# Patient Record
Sex: Male | Born: 1950 | Race: Black or African American | Hispanic: No | Marital: Married | Smoking: Former smoker
Health system: Southern US, Community
[De-identification: ages and names within clinical notes are randomized; demographics above are authoritative.]

## PROBLEM LIST (undated history)

## (undated) DIAGNOSIS — I10 Essential (primary) hypertension: Secondary | ICD-10-CM

## (undated) DIAGNOSIS — R931 Abnormal findings on diagnostic imaging of heart and coronary circulation: Secondary | ICD-10-CM

## (undated) DIAGNOSIS — R001 Bradycardia, unspecified: Secondary | ICD-10-CM

## (undated) DIAGNOSIS — G4733 Obstructive sleep apnea (adult) (pediatric): Secondary | ICD-10-CM

## (undated) DIAGNOSIS — Q2112 Patent foramen ovale: Secondary | ICD-10-CM

## (undated) DIAGNOSIS — I700 Atherosclerosis of aorta: Secondary | ICD-10-CM

## (undated) DIAGNOSIS — Z87898 Personal history of other specified conditions: Secondary | ICD-10-CM

## (undated) DIAGNOSIS — I4510 Unspecified right bundle-branch block: Secondary | ICD-10-CM

## (undated) DIAGNOSIS — R351 Nocturia: Secondary | ICD-10-CM

## (undated) DIAGNOSIS — I4819 Other persistent atrial fibrillation: Secondary | ICD-10-CM

## (undated) DIAGNOSIS — Z973 Presence of spectacles and contact lenses: Secondary | ICD-10-CM

## (undated) DIAGNOSIS — M1990 Unspecified osteoarthritis, unspecified site: Secondary | ICD-10-CM

## (undated) DIAGNOSIS — I77810 Thoracic aortic ectasia: Secondary | ICD-10-CM

## (undated) DIAGNOSIS — I441 Atrioventricular block, second degree: Secondary | ICD-10-CM

## (undated) DIAGNOSIS — C61 Malignant neoplasm of prostate: Secondary | ICD-10-CM

## (undated) HISTORY — PX: KNEE ARTHROSCOPY: SUR90

## (undated) HISTORY — DX: Atrioventricular block, second degree: I44.1

## (undated) HISTORY — PX: CARDIAC CATHETERIZATION: SHX172

## (undated) HISTORY — DX: Atherosclerosis of aorta: I70.0

## (undated) HISTORY — DX: Morbid (severe) obesity due to excess calories: E66.01

## (undated) HISTORY — DX: Unspecified right bundle-branch block: I45.10

## (undated) HISTORY — DX: Other persistent atrial fibrillation: I48.19

## (undated) HISTORY — DX: Patent foramen ovale: Q21.12

## (undated) HISTORY — DX: Bradycardia, unspecified: R00.1

## (undated) HISTORY — PX: COLONOSCOPY: SHX174

## (undated) HISTORY — DX: Thoracic aortic ectasia: I77.810

## (undated) HISTORY — PX: TRANSTHORACIC ECHOCARDIOGRAM: SHX275

## (undated) HISTORY — PX: PROSTATE BIOPSY: SHX241

## (undated) HISTORY — DX: Abnormal findings on diagnostic imaging of heart and coronary circulation: R93.1

---

## 2000-05-20 ENCOUNTER — Ambulatory Visit (HOSPITAL_COMMUNITY): Admission: RE | Admit: 2000-05-20 | Discharge: 2000-05-20 | Payer: Self-pay | Admitting: *Deleted

## 2000-07-08 ENCOUNTER — Ambulatory Visit (HOSPITAL_COMMUNITY): Admission: RE | Admit: 2000-07-08 | Discharge: 2000-07-08 | Payer: Self-pay | Admitting: Cardiology

## 2001-01-24 ENCOUNTER — Encounter: Payer: Self-pay | Admitting: Internal Medicine

## 2001-01-24 ENCOUNTER — Observation Stay (HOSPITAL_COMMUNITY): Admission: EM | Admit: 2001-01-24 | Discharge: 2001-01-25 | Payer: Self-pay | Admitting: Emergency Medicine

## 2001-11-05 ENCOUNTER — Encounter: Payer: Self-pay | Admitting: Emergency Medicine

## 2001-11-05 ENCOUNTER — Observation Stay (HOSPITAL_COMMUNITY): Admission: EM | Admit: 2001-11-05 | Discharge: 2001-11-06 | Payer: Self-pay | Admitting: Emergency Medicine

## 2002-09-14 ENCOUNTER — Encounter: Payer: Self-pay | Admitting: Family Medicine

## 2002-09-14 ENCOUNTER — Encounter: Admission: RE | Admit: 2002-09-14 | Discharge: 2002-09-14 | Payer: Self-pay | Admitting: Family Medicine

## 2003-08-30 ENCOUNTER — Inpatient Hospital Stay (HOSPITAL_COMMUNITY): Admission: EM | Admit: 2003-08-30 | Discharge: 2003-09-01 | Payer: Self-pay | Admitting: Emergency Medicine

## 2004-05-20 ENCOUNTER — Ambulatory Visit: Payer: Self-pay | Admitting: Gastroenterology

## 2004-12-25 ENCOUNTER — Emergency Department (HOSPITAL_COMMUNITY): Admission: EM | Admit: 2004-12-25 | Discharge: 2004-12-25 | Payer: Self-pay | Admitting: Emergency Medicine

## 2004-12-29 ENCOUNTER — Encounter: Admission: RE | Admit: 2004-12-29 | Discharge: 2004-12-29 | Payer: Self-pay | Admitting: Emergency Medicine

## 2005-07-05 ENCOUNTER — Encounter: Admission: RE | Admit: 2005-07-05 | Discharge: 2005-07-05 | Payer: Self-pay | Admitting: Neurosurgery

## 2012-06-16 HISTORY — PX: CARDIOVASCULAR STRESS TEST: SHX262

## 2012-11-04 ENCOUNTER — Emergency Department (HOSPITAL_COMMUNITY): Payer: 59

## 2012-11-04 ENCOUNTER — Inpatient Hospital Stay (HOSPITAL_COMMUNITY)
Admission: EM | Admit: 2012-11-04 | Discharge: 2012-11-05 | DRG: 308 | Disposition: A | Discharge status: Home / Self Care | Payer: 59 | Attending: Cardiology | Admitting: Cardiology

## 2012-11-04 ENCOUNTER — Encounter (HOSPITAL_COMMUNITY): Payer: Self-pay | Admitting: *Deleted

## 2012-11-04 ENCOUNTER — Other Ambulatory Visit: Payer: Self-pay | Admitting: Cardiology

## 2012-11-04 DIAGNOSIS — Z7901 Long term (current) use of anticoagulants: Secondary | ICD-10-CM

## 2012-11-04 DIAGNOSIS — G4733 Obstructive sleep apnea (adult) (pediatric): Secondary | ICD-10-CM | POA: Diagnosis present

## 2012-11-04 DIAGNOSIS — Z6841 Body Mass Index (BMI) 40.0 and over, adult: Secondary | ICD-10-CM

## 2012-11-04 DIAGNOSIS — I517 Cardiomegaly: Secondary | ICD-10-CM | POA: Diagnosis present

## 2012-11-04 DIAGNOSIS — Z87891 Personal history of nicotine dependence: Secondary | ICD-10-CM

## 2012-11-04 DIAGNOSIS — R0609 Other forms of dyspnea: Secondary | ICD-10-CM | POA: Diagnosis present

## 2012-11-04 DIAGNOSIS — I441 Atrioventricular block, second degree: Secondary | ICD-10-CM | POA: Diagnosis present

## 2012-11-04 DIAGNOSIS — I5031 Acute diastolic (congestive) heart failure: Secondary | ICD-10-CM | POA: Diagnosis present

## 2012-11-04 DIAGNOSIS — I10 Essential (primary) hypertension: Secondary | ICD-10-CM | POA: Diagnosis present

## 2012-11-04 DIAGNOSIS — I4891 Unspecified atrial fibrillation: Principal | ICD-10-CM | POA: Diagnosis present

## 2012-11-04 DIAGNOSIS — M129 Arthropathy, unspecified: Secondary | ICD-10-CM | POA: Diagnosis present

## 2012-11-04 DIAGNOSIS — R001 Bradycardia, unspecified: Secondary | ICD-10-CM | POA: Diagnosis present

## 2012-11-04 DIAGNOSIS — R0989 Other specified symptoms and signs involving the circulatory and respiratory systems: Secondary | ICD-10-CM | POA: Diagnosis present

## 2012-11-04 DIAGNOSIS — R079 Chest pain, unspecified: Secondary | ICD-10-CM

## 2012-11-04 DIAGNOSIS — I498 Other specified cardiac arrhythmias: Secondary | ICD-10-CM | POA: Diagnosis present

## 2012-11-04 HISTORY — DX: Essential (primary) hypertension: I10

## 2012-11-04 HISTORY — DX: Unspecified osteoarthritis, unspecified site: M19.90

## 2012-11-04 LAB — CBC
MCH: 29.6 pg (ref 26.0–34.0)
MCHC: 35.3 g/dL (ref 30.0–36.0)
Platelets: 244 10*3/uL (ref 150–400)
RBC: 4.6 MIL/uL (ref 4.22–5.81)
RDW: 12.8 % (ref 11.5–15.5)
WBC: 6.5 10*3/uL (ref 4.0–10.5)

## 2012-11-04 LAB — BASIC METABOLIC PANEL
BUN: 17 mg/dL (ref 6–23)
CO2: 22 mEq/L (ref 19–32)
GFR calc non Af Amer: 87 mL/min — ABNORMAL LOW (ref >90)
Glucose, Bld: 136 mg/dL — ABNORMAL HIGH (ref 70–99)
Sodium: 138 mEq/L (ref 135–145)

## 2012-11-04 LAB — COMPREHENSIVE METABOLIC PANEL
AST: 16 U/L (ref 0–37)
Albumin: 3.2 g/dL — ABNORMAL LOW (ref 3.5–5.2)
Alkaline Phosphatase: 63 U/L (ref 39–117)
Chloride: 103 mEq/L (ref 96–112)
Potassium: 3.7 mEq/L (ref 3.5–5.1)
Sodium: 138 mEq/L (ref 135–145)
Total Bilirubin: 0.6 mg/dL (ref 0.3–1.2)
Total Protein: 7.2 g/dL (ref 6.0–8.3)

## 2012-11-04 LAB — PRO B NATRIURETIC PEPTIDE: Pro B Natriuretic peptide (BNP): 758.8 pg/mL — ABNORMAL HIGH (ref 0–125)

## 2012-11-04 LAB — TROPONIN I
Troponin I: <0.3 ng/mL (ref <0.30)
Troponin I: <0.3 ng/mL (ref <0.30)

## 2012-11-04 LAB — POCT I-STAT TROPONIN I

## 2012-11-04 LAB — HEMOGLOBIN A1C: Mean Plasma Glucose: 134 mg/dL — ABNORMAL HIGH (ref <117)

## 2012-11-04 LAB — TSH: TSH: 1.023 u[IU]/mL (ref 0.350–4.500)

## 2012-11-04 MED ORDER — ASPIRIN 81 MG PO CHEW
324.0000 mg | CHEWABLE_TABLET | Freq: Once | ORAL | Status: AC
  Administered 2012-11-04: 324 mg via ORAL
  Filled 2012-11-04: qty 4

## 2012-11-04 MED ORDER — ASPIRIN EC 81 MG PO TBEC
81.0000 mg | DELAYED_RELEASE_TABLET | Freq: Every day | ORAL | Status: DC
  Administered 2012-11-05: 81 mg via ORAL
  Filled 2012-11-04: qty 1

## 2012-11-04 MED ORDER — SODIUM CHLORIDE 0.9 % IJ SOLN: 3.0000 mL | INTRAMUSCULAR | Status: DC | PRN

## 2012-11-04 MED ORDER — SODIUM CHLORIDE 0.9 % IV SOLN: 250.0000 mL | INTRAVENOUS | Status: DC | PRN

## 2012-11-04 MED ORDER — POTASSIUM CHLORIDE CRYS ER 20 MEQ PO TBCR
40.0000 meq | EXTENDED_RELEASE_TABLET | Freq: Once | ORAL | Status: AC
  Administered 2012-11-04: 40 meq via ORAL
  Filled 2012-11-04: qty 2

## 2012-11-04 MED ORDER — SODIUM CHLORIDE 0.9 % IJ SOLN
3.0000 mL | Freq: Two times a day (BID) | INTRAMUSCULAR | Status: DC
  Administered 2012-11-04 – 2012-11-05 (×2): 3 mL via INTRAVENOUS

## 2012-11-04 MED ORDER — NEBIVOLOL HCL 5 MG PO TABS
5.0000 mg | ORAL_TABLET | Freq: Every day | ORAL | Status: DC
  Administered 2012-11-05: 5 mg via ORAL
  Filled 2012-11-04 (×2): qty 1

## 2012-11-04 MED ORDER — ONDANSETRON HCL 4 MG/2ML IJ SOLN: 4.0000 mg | Freq: Four times a day (QID) | INTRAMUSCULAR | Status: DC | PRN

## 2012-11-04 MED ORDER — ACETAMINOPHEN 325 MG PO TABS: 650.0000 mg | ORAL_TABLET | ORAL | Status: DC | PRN

## 2012-11-04 MED ORDER — DILTIAZEM HCL ER 180 MG PO CP24
180.0000 mg | ORAL_CAPSULE | Freq: Every day | ORAL | Status: DC
  Administered 2012-11-05: 180 mg via ORAL
  Filled 2012-11-04 (×2): qty 1

## 2012-11-04 MED ORDER — NITROGLYCERIN 0.4 MG SL SUBL: 0.4000 mg | SUBLINGUAL_TABLET | SUBLINGUAL | Status: DC | PRN

## 2012-11-04 MED ORDER — ASPIRIN 81 MG PO CHEW: 324.0000 mg | CHEWABLE_TABLET | ORAL | Status: AC

## 2012-11-04 MED ORDER — DIGOXIN 125 MCG PO TABS
0.1250 mg | ORAL_TABLET | Freq: Every day | ORAL | Status: DC
  Filled 2012-11-04: qty 1

## 2012-11-04 MED ORDER — SODIUM CHLORIDE 0.9 % IJ SOLN: 3.0000 mL | Freq: Two times a day (BID) | INTRAMUSCULAR | Status: DC

## 2012-11-04 MED ORDER — HYDROCORTISONE 1 % EX CREA
1.0000 "application " | TOPICAL_CREAM | Freq: Three times a day (TID) | CUTANEOUS | Status: DC | PRN
  Filled 2012-11-04: qty 28

## 2012-11-04 MED ORDER — FUROSEMIDE 10 MG/ML IJ SOLN
20.0000 mg | Freq: Once | INTRAMUSCULAR | Status: AC
  Administered 2012-11-04: 20 mg via INTRAVENOUS

## 2012-11-04 MED ORDER — LOSARTAN POTASSIUM 50 MG PO TABS
100.0000 mg | ORAL_TABLET | Freq: Every day | ORAL | Status: DC
  Administered 2012-11-05: 100 mg via ORAL
  Filled 2012-11-04: qty 2

## 2012-11-04 MED ORDER — WARFARIN - PHARMACIST DOSING INPATIENT
Freq: Every day | Status: DC
  Administered 2012-11-04: 18:00:00

## 2012-11-04 MED ORDER — SODIUM CHLORIDE 0.9 % IV SOLN: 250.0000 mL | INTRAVENOUS | Status: DC

## 2012-11-04 MED ORDER — WARFARIN SODIUM 7.5 MG PO TABS
7.5000 mg | ORAL_TABLET | Freq: Once | ORAL | Status: AC
  Administered 2012-11-04: 7.5 mg via ORAL
  Filled 2012-11-04: qty 1

## 2012-11-04 MED ORDER — ASPIRIN 300 MG RE SUPP
300.0000 mg | RECTAL | Status: AC
  Filled 2012-11-04: qty 1

## 2012-11-04 NOTE — Progress Notes (Signed)
ANTICOAGULATION CONSULT NOTE - Initial Consult  Pharmacy Consult for Heparin Indication: CP / Afib  No Known Allergies  Vital Signs: Temp: 98.8 F (37.1 C) (05/08 0552) Temp src: Oral (05/08 0552) BP: 148/68 mmHg (05/08 1100) Pulse Rate: 71 (05/08 1100)  Labs:  Recent Labs  11/04/12 0610 11/04/12 0617  HGB 13.6  --   HCT 38.5*  --   PLT 244  --   LABPROT  --  26.2*  INR  --  2.55*  CREATININE 0.95  --     CrCl is unknown because there is no height on file for the current visit.   Medical History: Past Medical History  Diagnosis Date  . Atrial fibrillation   . Hypertension    Assessment: 62 year old male on Coumadin PTA for Afib.  INR is currently therapeutic at 2.55.   Admitted with CP.  Planning to transition to heparin when INR is less than 2.  Goal of Therapy:  Heparin level 0.3-0.7 units/ml Monitor platelets by anticoagulation protocol: Yes   Plan:  1) Hold heparin until INR < 2 2) Daily INR  Thank you. Okey Regal, PharmD [phone]  11/04/2012,11:51 AM

## 2012-11-04 NOTE — ED Notes (Signed)
Pt c/o CP since yesterday.  States it worsened overnight.  C/o chest tightness, SOB, weakness.  Denies n/v.

## 2012-11-04 NOTE — Care Management Note (Signed)
  Page 1 of 1   11/04/2012     2:22:35 PM   CARE MANAGEMENT NOTE 11/04/2012  Patient:  Alec Hunt, Alec Hunt   Account Number:  [id number]  Date Initiated:  11/04/2012  Documentation initiated by:  Junius Creamer  Subjective/Objective Assessment:   slow at fib     Action/Plan:   lives w wife   Anticipated DC Date:     Anticipated DC Plan:        DC Planning Services  CM consult      Choice offered to / List presented to:             Status of service:   Medicare Important Message given?   (If response is "NO", the following Medicare IM given date fields will be blank) Date Medicare IM given:   Date Additional Medicare IM given:    Discharge Disposition:    Per UR Regulation:  Reviewed for med. necessity/level of care/duration of stay  If discussed at Long Length of Stay Meetings, dates discussed:    Comments:

## 2012-11-04 NOTE — H&P (Addendum)
Alec Hunt is a 62 y.o. male  Admit date: 11/04/2012 Referring Physician: [facility] Primary Cardiologist:: Armanda Magic Chief complaint / reason for admission: Chest discomfort and atrial fibrillation  HPI: 62 year old gentleman with obstructive sleep apnea, hypertension, and history of paroxysmal atrial fibrillation. Yesterday it around 2:00, while at work he developed chest discomfort associated with palpitations. He noted an irregular heart rate. He finished work. He had a restless night because of chest discomfort and dyspnea, he came to the emergency room at around 5 AM where he was noted to be in atrial fibrillation with a controlled ventricular rate. His begin with him this morning he is comfortable lying flat. He has not have any dyspnea. He has persisting mild chest    PMH:    Past Medical History  Diagnosis Date  . Atrial fibrillation   . Hypertension   . Sleep apnea     cpap  . Arthritis     kness & shoulders    PSH:    Past Surgical History  Procedure Laterality Date  . Knee arthroscopic    . Colonoscopy      ALLERGIES:   Review of patient's allergies indicates no known allergies.  Prior to Admit Meds:   Prescriptions prior to admission  Medication Sig Dispense Refill  . digoxin (LANOXIN) 0.125 MG tablet Take 0.125 mg by mouth daily.      Marland Kitchen diltiazem (DILACOR XR) 180 MG 24 hr capsule Take 180 mg by mouth daily.      Marland Kitchen losartan (COZAAR) 100 MG tablet Take 100 mg by mouth daily.      . nebivolol (BYSTOLIC) 5 MG tablet Take 5 mg by mouth daily.      Marland Kitchen warfarin (COUMADIN) 5 MG tablet Take 7.5-10 mg by mouth every evening. tue thu sat 1.5 tabs , all other days--2 tabs       Family HX:   History reviewed. No pertinent family history. Social HX:    History   Social History  . Marital Status: Married    Spouse Name: N/A    Number of Children: N/A  . Years of Education: N/A   Occupational History  . Not on file.   Social History Main Topics  .  Smoking status: Former Smoker    Quit date: 02/02/2001  . Smokeless tobacco: Never Used  . Alcohol Use: No  . Drug Use: No  . Sexually Active: Not on file   Other Topics Concern  . Not on file   Social History Narrative  . No narrative on file     ROS: No bleeding on Coumadin. No history of stroke. Denies orthopnea and PND. Sleeps with CPAP mask. Prior to the irregular heart rhythm, he has not had chest pain. No episodes of syncope.  Physical Exam: Blood pressure 167/100, pulse 66, temperature 97.8 F (36.6 C), temperature source Oral, resp. rate 13, height 6\' 1"  (1.854 m), weight 153.4 kg (338 lb 3 oz), SpO2 100.00%.    The patient is in no acute distress. He is a moderate to morbidly obese African American male.  Skin is dry. No nail bed cyanosis.  Neck exam is difficult because of his size. No obvious JVD is noted. No carotid bruits are heard.  Chest is clear to auscultation and percussion. No wheezing or rales are heard.  The cardiac exam reveals no murmur, rub, or gallop. The rhythm is slow and irregularly irregular.   Abdomen is soft. No tenderness is noted.  Bowel sounds are normal.  Extremities reveal no edema. Pulses are 2+ and symmetric in upper and lower extremities.  The neurological exam is unremarkable. Labs:   Lab Results  Component Value Date   WBC 6.5 11/04/2012   HGB 13.6 11/04/2012   HCT 38.5* 11/04/2012   MCV 83.7 11/04/2012   PLT 244 11/04/2012     Recent Labs Lab 11/04/12 1418  NA 138  K 3.7  CL 103  CO2 27  BUN 14  CREATININE 1.00  CALCIUM 9.0  PROT 7.2  BILITOT 0.6  ALKPHOS 63  ALT 14  AST PENDING  GLUCOSE 120*   Troponin (Point of Care Test)  Recent Labs  11/04/12 0934  TROPIPOC 0.01     Radiology:   RADIOLOGY REPORT*  Clinical Data: 62 year old male chest pain. Chest tightness.  Atrial fibrillation.  CHEST - 2 VIEW  Comparison: 12/25/2004.  Findings: Stable lung volumes. Although, on the lateral view the  lungs are more  shallow. Cardiac size appears mildly increased and  2006 and is at the upper limits of normal to mildly enlarged. Other  mediastinal contours are within normal limits. Visualized tracheal  air column is within normal limits. No pneumothorax. No pleural  effusion. On the lateral view lung base atelectasis is suspected,  with streaky opacity that is not correlated on the frontal. No  acute osseous abnormality identified.  IMPRESSION:  Mild cardiac enlargement since 2006. Atelectasis suspected on the  lateral view.   ECHOCARDIOGRAM: 05/2012 at [facility] Conclusions: 1. Mild concentric left ventricular hypertrophy. 2. Left ventricular ejection fraction estimated by 2D at 66.9 % percent 3. Mild left atrial enlargement. 4.1 cm  EKG:   Atrial fib with controlled to slow heart rate. LVH. No acute ST-T wave change  ASSESSMENT:  1. Recurrent atrial fibrillation with slow ventricular response  2. Chest tightness without EKG changes of ischemia  3. Obstructive sleep apnea  4. Obesity  5. Hypertension  6. Acute diastolic heart failure in a patient with known LVH, normal systolic function, and elevated BNP  Plan:  1. Serial cardiac markers to rule out myocardial infarction  2. Hold Coumadin and start IV heparin  3. N.p.o. after midnight for electrical cardioversion if he remains in atrial fibrillation  4. May have to  adjust his current medical regimen to avoid excessive bradycardia.  5. IV Lasix with potassium replacement Lesleigh Noe 11/04/2012 4:32 PM

## 2012-11-04 NOTE — ED Provider Notes (Signed)
EKG Interpretation:  Date & Time: 11/04/2012 5:48 AM  Rate: 61  Rhythm: atrial fibrillation  QRS Axis: normal  Intervals: normal  ST/T Wave abnormalities: normal  Conduction Disutrbances:none  Narrative Interpretation:   Old EKG Reviewed: none available      Hanley Seamen, MD 11/04/12 [phone]

## 2012-11-04 NOTE — ED Provider Notes (Signed)
Medical screening examination/treatment/procedure(s) were conducted as a shared visit with non-physician practitioner(s) and myself.  I personally evaluated the patient during the encounter  Patient just having some mild lower substernal pressure at this time. Monitor is showing atrial fibrillation with a slow ventricular response. Awaiting lab results.  Hanley Seamen, MD 11/04/12 [phone]

## 2012-11-04 NOTE — Progress Notes (Signed)
ANTICOAGULATION CONSULT NOTE - Initial Consult  Pharmacy Consult for Coumadin Indication: atrial fibrillation  No Known Allergies  Patient Measurements: Height: 6\' 1"  (185.4 cm) Weight: 338 lb 3 oz (153.4 kg) IBW/kg (Calculated) : 79.9  Vital Signs: Temp: 97.8 F (36.6 C) (05/08 1200) Temp src: Oral (05/08 1200) BP: 167/100 mmHg (05/08 1215) Pulse Rate: 66 (05/08 1215)  Labs:  Recent Labs  11/04/12 0610 11/04/12 0617 11/04/12 1418  HGB 13.6  --   --   HCT 38.5*  --   --   PLT 244  --   --   LABPROT  --  26.2*  --   INR  --  2.55*  --   CREATININE 0.95  --  1.00  TROPONINI  --   --  <0.30    Estimated Creatinine Clearance: 118.4 ml/min (by C-G formula based on Cr of 1).   Medical History: Past Medical History  Diagnosis Date  . Atrial fibrillation   . Hypertension   . Sleep apnea     cpap  . Arthritis     kness & shoulders    Medications:  Prescriptions prior to admission  Medication Sig Dispense Refill  . digoxin (LANOXIN) 0.125 MG tablet Take 0.125 mg by mouth daily.      Marland Kitchen diltiazem (DILACOR XR) 180 MG 24 hr capsule Take 180 mg by mouth daily.      Marland Kitchen losartan (COZAAR) 100 MG tablet Take 100 mg by mouth daily.      . nebivolol (BYSTOLIC) 5 MG tablet Take 5 mg by mouth daily.      Marland Kitchen warfarin (COUMADIN) 5 MG tablet Take 7.5-10 mg by mouth every evening. tue thu sat 1.5 tabs , all other days--2 tabs        Assessment: 62 year old male on chronic anticoagulation with Coumadin for atrial fibrillation.  His INR is therapeutic and he is to continue Coumadin pending possible DCCV 5/9.  He has not taken his Coumadin dose today.  Goal of Therapy:  INR 2-3   Plan:  Give his usual Thursday Coumadin dose of 7.5 mg Daily PT/INR  Estella Husk, Pharm.D., BCPS Clinical Pharmacist Phone: [phone] or [phone] Pager: [phone] 11/04/2012, 4:56 PM

## 2012-11-04 NOTE — ED Provider Notes (Signed)
History     CSN: [id number]  Arrival date & time 11/04/12  0547   First MD Initiated Contact with Patient 11/04/12 [phone]      Chief Complaint  Patient presents with  . Chest Pain    (Consider location/radiation/quality/duration/timing/severity/associated sxs/prior treatment) HPI Comments: Patient with history of atrial fibrillation on Coumadin, high blood pressure presents with complaint of chest tightness that began yesterday. Patient states that he was found to be in atrial fibrillation while at work. He complains of associated shortness of breath. Chest tightness is across the middle anterior chest. It does not radiate. It is not associated with diaphoresis, nausea or vomiting, syncope. Patient notes that he was recently changed from Benicar to losartan due to insurance issues. He is also on digoxin which he has been taking. His atrial fibrillation has been well controlled. He denies lower extremity edema, abdominal pain, bowel or urinary symptoms. No aspirin this morning. Onset of symptoms acute. Course is constant. Nothing makes symptoms better or worse. Patient reports catheterization about 10 years ago. No stents placed at that time. He is followed by [facility] cardiology.  The history is provided by the patient.    Past Medical History  Diagnosis Date  . Atrial fibrillation   . Hypertension     History reviewed. No pertinent past surgical history.  History reviewed. No pertinent family history.  History  Substance Use Topics  . Smoking status: Former Games developer  . Smokeless tobacco: Not on file  . Alcohol Use: No      Review of Systems  Constitutional: Negative for fever.  HENT: Negative for sore throat and rhinorrhea.   Eyes: Negative for redness.  Respiratory: Positive for shortness of breath. Negative for cough.   Cardiovascular: Positive for chest pain. Negative for leg swelling.  Gastrointestinal: Negative for nausea, vomiting, abdominal pain and diarrhea.   Genitourinary: Negative for dysuria.  Musculoskeletal: Negative for myalgias.  Skin: Negative for rash.  Neurological: Negative for headaches.    Allergies  Review of patient's allergies indicates no known allergies.  Home Medications   Current Outpatient Rx  Name  Route  Sig  Dispense  Refill  . digoxin (LANOXIN) 0.125 MG tablet   Oral   Take 0.125 mg by mouth daily.         Marland Kitchen diltiazem (DILACOR XR) 180 MG 24 hr capsule   Oral   Take 180 mg by mouth daily.         Marland Kitchen losartan (COZAAR) 100 MG tablet   Oral   Take 100 mg by mouth daily.         . nebivolol (BYSTOLIC) 5 MG tablet   Oral   Take 5 mg by mouth daily.         Marland Kitchen warfarin (COUMADIN) 5 MG tablet   Oral   Take 7.5-10 mg by mouth every evening. tue thu sat 1.5 tabs , all other days--2 tabs           BP 165/89  Pulse 60  Temp(Src) 98.8 F (37.1 C) (Oral)  Resp 18  SpO2 99%  Physical Exam  Nursing note and vitals reviewed. Constitutional: He appears well-developed and well-nourished.  HENT:  Head: Normocephalic and atraumatic.  Eyes: Conjunctivae are normal. Right eye exhibits no discharge. Left eye exhibits no discharge.  Neck: Normal range of motion. Neck supple.  Cardiovascular: Normal rate, regular rhythm and normal heart sounds.   Pulmonary/Chest: Effort normal and breath sounds normal.  Abdominal: Soft. There is no tenderness.  Musculoskeletal: He exhibits no edema.  Neurological: He is alert.  Skin: Skin is warm and dry.  Psychiatric: He has a normal mood and affect.    ED Course  Procedures (including critical care time)  Labs Reviewed  CBC - Abnormal; Notable for the following:    HCT 38.5 (*)    All other components within normal limits  BASIC METABOLIC PANEL - Abnormal; Notable for the following:    Glucose, Bld 136 (*)    GFR calc non Af Amer 87 (*)    All other components within normal limits  PROTIME-INR - Abnormal; Notable for the following:    Prothrombin Time 26.2  (*)    INR 2.55 (*)    All other components within normal limits  DIGOXIN LEVEL - Abnormal; Notable for the following:    Digoxin Level 0.7 (*)    All other components within normal limits  POCT I-STAT TROPONIN I  POCT I-STAT TROPONIN I   Dg Chest 2 View  11/04/2012  *RADIOLOGY REPORT*  Clinical Data: 62 year old male chest pain.  Chest tightness. Atrial fibrillation.  CHEST - 2 VIEW  Comparison: 12/25/2004.  Findings: Stable lung volumes.  Although, on the lateral view the lungs are more shallow.  Cardiac size appears mildly increased and 2006 and is at the upper limits of normal to mildly enlarged. Other mediastinal contours are within normal limits.  Visualized tracheal air column is within normal limits.  No pneumothorax.  No pleural effusion.  On the lateral view lung base atelectasis is suspected, with streaky opacity that is not correlated on the frontal. No acute osseous abnormality identified.  IMPRESSION: Mild cardiac enlargement since 2006.  Atelectasis suspected on the lateral view.   Original Report Authenticated By: Odessa Fleming III, M.D.      1. Atrial fibrillation with slow ventricular response   2. Chest pain     6:07 AM Patient seen and examined. Work-up initiated. Medications ordered.   Vital signs reviewed and are as follows: Filed Vitals:   11/04/12 0552  BP: 165/89  Pulse: 60  Temp: 98.8 F (37.1 C)  Resp: 18   8:14 AM Re-exam pt doing well. Digoxin slightly low. Awaiting BMP.   9:23 AM Spoke with Dr. Katrinka Blazing of [facility] cardiology who will see.   10:21 AM [facility] will admit.    MDM  CP/afib admit.         Renne Crigler, PA-C 11/04/12 1022

## 2012-11-04 NOTE — ED Notes (Signed)
Pt. C/o central chest pressure starting yesterday with SOB. Pt. In afib, states has a hx of same but converted out on own.

## 2012-11-05 ENCOUNTER — Encounter (HOSPITAL_COMMUNITY): Payer: Self-pay | Admitting: Anesthesiology

## 2012-11-05 ENCOUNTER — Encounter (HOSPITAL_COMMUNITY)
Admission: EM | Disposition: A | Discharge status: Home / Self Care | Payer: Self-pay | Source: Home / Self Care | Attending: Cardiology

## 2012-11-05 DIAGNOSIS — G4733 Obstructive sleep apnea (adult) (pediatric): Secondary | ICD-10-CM | POA: Diagnosis present

## 2012-11-05 DIAGNOSIS — R001 Bradycardia, unspecified: Secondary | ICD-10-CM | POA: Diagnosis present

## 2012-11-05 LAB — BASIC METABOLIC PANEL
CO2: 28 mEq/L (ref 19–32)
Calcium: 9 mg/dL (ref 8.4–10.5)
Creatinine, Ser: 1.05 mg/dL (ref 0.50–1.35)

## 2012-11-05 LAB — PROTIME-INR: Prothrombin Time: 24.7 seconds — ABNORMAL HIGH (ref 11.6–15.2)

## 2012-11-05 SURGERY — CARDIOVERSION
Anesthesia: Monitor Anesthesia Care | Wound class: Clean

## 2012-11-05 MED ORDER — WARFARIN SODIUM 10 MG PO TABS
10.0000 mg | ORAL_TABLET | Freq: Once | ORAL | Status: DC
  Filled 2012-11-05: qty 1

## 2012-11-05 NOTE — Progress Notes (Signed)
Pt remains in SB rate 50's with occasional dips into low 30's.  Occasional dropped QRS complexes noted.  Asymptomatic .  Dr Terressa Koyanagi made aware of same.  Kevin Fenton, RN

## 2012-11-05 NOTE — Preoperative (Signed)
Beta Blockers   Reason not to administer Beta Blockers:Not Applicable 

## 2012-11-05 NOTE — Anesthesia Preprocedure Evaluation (Signed)
Anesthesia Evaluation  Patient identified by MRN, date of birth, ID band Patient awake    Reviewed: Allergy & Precautions, H&P , NPO status , Patient's Chart, lab work & pertinent test results, reviewed documented beta blocker date and time   Airway Mallampati: II TM Distance: >3 FB Neck ROM: full    Dental   Pulmonary sleep apnea ,  breath sounds clear to auscultation        Cardiovascular hypertension, On Medications and On Home Beta Blockers + dysrhythmias Atrial Fibrillation Rhythm:regular     Neuro/Psych negative neurological ROS  negative psych ROS   GI/Hepatic negative GI ROS, Neg liver ROS,   Endo/Other  Morbid obesity  Renal/GU negative Renal ROS  negative genitourinary   Musculoskeletal   Abdominal   Peds  Hematology negative hematology ROS (+)   Anesthesia Other Findings See surgeon's H&P   Reproductive/Obstetrics negative OB ROS                           Anesthesia Physical Anesthesia Plan  ASA: III  Anesthesia Plan: General   Post-op Pain Management:    Induction: Intravenous  Airway Management Planned: Mask  Additional Equipment:   Intra-op Plan:   Post-operative Plan:   Informed Consent: I have reviewed the patients History and Physical, chart, labs and discussed the procedure including the risks, benefits and alternatives for the proposed anesthesia with the patient or authorized representative who has indicated his/her understanding and acceptance.   Dental Advisory Given  Plan Discussed with: CRNA and Surgeon  Anesthesia Plan Comments:         Anesthesia Quick Evaluation

## 2012-11-05 NOTE — Progress Notes (Signed)
ANTICOAGULATION CONSULT NOTE  Pharmacy Consult for Coumadin Indication: atrial fibrillation  No Known Allergies  Labs:  Recent Labs  11/04/12 0610 11/04/12 0617 11/04/12 1418 11/04/12 1725 11/04/12 2251 11/05/12 0450  HGB 13.6  --   --   --   --   --   HCT 38.5*  --   --   --   --   --   PLT 244  --   --   --   --   --   LABPROT  --  26.2*  --   --   --  24.7*  INR  --  2.55*  --   --   --  2.35*  CREATININE 0.95  --  1.00  --   --  1.05  TROPONINI  --   --  <0.30 <0.30 <0.30  --     Estimated Creatinine Clearance: 113 ml/min (by C-G formula based on Cr of 1.05).   Medical History: Past Medical History  Diagnosis Date  . Atrial fibrillation   . Hypertension   . Sleep apnea     cpap  . Arthritis     kness & shoulders   Assessment: 62 year old male on chronic anticoagulation with Coumadin for atrial fibrillation.  His INR is therapeutic and he is to continue Coumadin pending possible DCCV 5/9.   Goal of Therapy:  INR 2-3   Plan:  Give his usual Thursday Coumadin dose of 10 mg Daily PT/INR  Thank you. Okey Regal, PharmD [phone]  11/05/2012, 8:29 AM

## 2012-11-05 NOTE — Discharge Summary (Addendum)
Patient ID: Alec Hunt MRN: [id number] DOB/AGE: 63-Sep-1952 63 y.o.  Admit date: 11/04/2012 Discharge date: 11/05/2012  Primary Discharge Diagnosis:  Atrial fibrillation with slow/normal ventricular response-symptomatic  Secondary Discharge Diagnosis: Chest pain-resolved, morbid obesity, obstructive sleep apnea, bradycardia, transient second-degree heart block type 1  Significant Diagnostic Studies: EKG on 11/04/12 at 5:48 AM demonstrated atrial fibrillation with heart rate of 60, no ST segment changes. He converted and EKG that same day at 2019 demonstrated sinus rhythm, no ST segment changes.   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 He noted his irregular heart rate/palpitations. He had a restless night because of chest discomfort and dyspnea. He states that he has symptoms such as this with his atrial fibrillation. He is comfortable laying flat. No dyspnea. Over the day yesterday, he auto converted and did well overnight. He did have transient bradycardia with heart rates dipping into the 30s at times and had associated second-degree heart block type I transiently. Currently he is demonstrating a heart rate of 65, sinus rhythm when talking with me and is happy, asymptomatic. I decided to discontinue his digoxin 0.125 mg twice a day. He will continue both his diltiazem as well as Bystolic.  He states that he is feeling well enough to go home and is eager to do so. He has been checking his blood pressure at work after starting losartan. His blood pressure was elevated earlier this morning but upon repeat was improved. He utilizes CPAP for obstructive sleep apnea.  Impression chest pain, EKG was unremarkable with no ischemic changes and troponins were normal. BNP was slightly elevated at 758. Hemoglobin A1c  6.3. TSH was 1.0.   Discharge Exam: Blood pressure 163/103, pulse 57, temperature 97.7 F (36.5 C), temperature source Oral, resp. rate 13, height 6\' 1"  (1.854 m), weight 153.9 kg (339 lb 4.6 oz), SpO2 100.00%.    General: Alert and oriented x3 in no acute distres Cardiovascular: Regular rate and rhythm/no murmurs, no JVD Abdomen: Soft, obese, nontender Lungs: Clear to auscultation bilaterally  Labs:   Lab Results  Component Value Date   WBC 6.5 11/04/2012   HGB 13.6 11/04/2012   HCT 38.5* 11/04/2012   MCV 83.7 11/04/2012   PLT 244 11/04/2012    Recent Labs Lab 11/04/12 1418 11/05/12 0450  NA 138 141  K 3.7 3.8  CL 103 105  CO2 27 28  BUN 14 13  CREATININE 1.00 1.05  CALCIUM 9.0 9.0  PROT 7.2  --   BILITOT 0.6  --   ALKPHOS 63  --   ALT 14  --   AST 16  --   GLUCOSE 120* 121*   Lab Results  Component Value Date   TROPONINI <0.30 11/04/2012       FOLLOW UP PLANS AND APPOINTMENTS Discharge Orders   Future Orders Complete By Expires     Diet - low sodium heart healthy  As directed     Increase activity slowly  As directed         Medication List    STOP taking these medications       digoxin 0.125 MG tablet  Commonly known as:  LANOXIN      TAKE these medications       diltiazem 180 MG 24 hr capsule  Commonly known as:  DILACOR XR  Take 180 mg by mouth  daily.     losartan 100 MG tablet  Commonly known as:  COZAAR  Take 100 mg by mouth daily.     nebivolol 5 MG tablet  Commonly known as:  BYSTOLIC  Take 5 mg by mouth daily.     warfarin 5 MG tablet  Commonly known as:  COUMADIN  Take 7.5-10 mg by mouth every evening. tue thu sat 1.5 tabs , all other days--2 tabs           Follow-up Information   Follow up with FERGUSON,CYNTHIA A, NP On 11/12/2012. (9:45am)    Contact information:   [facility] [address], [address] [place] [address] [phone]       BRING ALL MEDICATIONS WITH YOU TO FOLLOW UP  APPOINTMENTS  Time spent with patient to include physician time: 25 minutes Signed: SKAINS, MARK 11/05/2012, 9:30 AM

## 2012-11-05 NOTE — Progress Notes (Signed)
Pt has remained in NSR since spontaneous conversion from Afib at 1939 hrs 5.8.2014. Notified by CMT at 01:19 that pt  Developed extreme sinus brady rate 29 with brief  2 degree AV Block type II.  HR returned  to SR/SB 50s-60s  Pt sound asleep during event. Awakened to voice, denied Dizziness, CP SOB.  BP 165/97.  Will continue to monitor pt.  Kevin Fenton, RN

## 2013-04-29 ENCOUNTER — Ambulatory Visit (INDEPENDENT_AMBULATORY_CARE_PROVIDER_SITE_OTHER): Payer: 59 | Admitting: Pharmacist

## 2013-04-29 DIAGNOSIS — I4891 Unspecified atrial fibrillation: Secondary | ICD-10-CM

## 2013-04-29 DIAGNOSIS — I4819 Other persistent atrial fibrillation: Secondary | ICD-10-CM | POA: Insufficient documentation

## 2013-06-02 ENCOUNTER — Ambulatory Visit: Payer: 59 | Admitting: Cardiology

## 2013-06-04 ENCOUNTER — Other Ambulatory Visit: Payer: Self-pay | Admitting: Cardiology

## 2013-06-10 ENCOUNTER — Ambulatory Visit (INDEPENDENT_AMBULATORY_CARE_PROVIDER_SITE_OTHER): Payer: 59 | Admitting: Pharmacist

## 2013-06-10 DIAGNOSIS — I4891 Unspecified atrial fibrillation: Secondary | ICD-10-CM

## 2013-06-19 ENCOUNTER — Other Ambulatory Visit: Payer: Self-pay | Admitting: Cardiology

## 2013-06-30 HISTORY — PX: ROTATOR CUFF REPAIR: SHX139

## 2013-07-04 ENCOUNTER — Emergency Department (HOSPITAL_COMMUNITY)
Admission: EM | Admit: 2013-07-04 | Discharge: 2013-07-04 | Disposition: A | Discharge status: Home / Self Care | Payer: 59 | Attending: Emergency Medicine | Admitting: Emergency Medicine

## 2013-07-04 ENCOUNTER — Emergency Department (HOSPITAL_COMMUNITY): Payer: 59

## 2013-07-04 ENCOUNTER — Encounter (HOSPITAL_COMMUNITY): Payer: Self-pay | Admitting: Emergency Medicine

## 2013-07-04 DIAGNOSIS — G4730 Sleep apnea, unspecified: Secondary | ICD-10-CM | POA: Insufficient documentation

## 2013-07-04 DIAGNOSIS — R0789 Other chest pain: Secondary | ICD-10-CM

## 2013-07-04 DIAGNOSIS — Z7901 Long term (current) use of anticoagulants: Secondary | ICD-10-CM | POA: Insufficient documentation

## 2013-07-04 DIAGNOSIS — R071 Chest pain on breathing: Secondary | ICD-10-CM | POA: Insufficient documentation

## 2013-07-04 DIAGNOSIS — M129 Arthropathy, unspecified: Secondary | ICD-10-CM | POA: Insufficient documentation

## 2013-07-04 DIAGNOSIS — M62838 Other muscle spasm: Secondary | ICD-10-CM | POA: Insufficient documentation

## 2013-07-04 DIAGNOSIS — Z87891 Personal history of nicotine dependence: Secondary | ICD-10-CM | POA: Insufficient documentation

## 2013-07-04 DIAGNOSIS — I10 Essential (primary) hypertension: Secondary | ICD-10-CM | POA: Insufficient documentation

## 2013-07-04 DIAGNOSIS — Z79899 Other long term (current) drug therapy: Secondary | ICD-10-CM | POA: Insufficient documentation

## 2013-07-04 DIAGNOSIS — I4891 Unspecified atrial fibrillation: Secondary | ICD-10-CM | POA: Insufficient documentation

## 2013-07-04 LAB — CBC
HCT: 41.4 % (ref 39.0–52.0)
HEMOGLOBIN: 14.4 g/dL (ref 13.0–17.0)
MCH: 31 pg (ref 26.0–34.0)
MCHC: 34.8 g/dL (ref 30.0–36.0)
MCV: 89 fL (ref 78.0–100.0)
PLATELETS: 271 10*3/uL (ref 150–400)
RBC: 4.65 MIL/uL (ref 4.22–5.81)
RDW: 13 % (ref 11.5–15.5)
WBC: 7.4 10*3/uL (ref 4.0–10.5)

## 2013-07-04 LAB — PRO B NATRIURETIC PEPTIDE: PRO B NATRI PEPTIDE: 47.7 pg/mL (ref 0–125)

## 2013-07-04 LAB — BASIC METABOLIC PANEL
BUN: 17 mg/dL (ref 6–23)
CALCIUM: 9.7 mg/dL (ref 8.4–10.5)
CO2: 25 mEq/L (ref 19–32)
Chloride: 104 mEq/L (ref 96–112)
Creatinine, Ser: 1.14 mg/dL (ref 0.50–1.35)
GFR, EST AFRICAN AMERICAN: 77 mL/min — AB (ref >90)
GFR, EST NON AFRICAN AMERICAN: 67 mL/min — AB (ref >90)
Glucose, Bld: 109 mg/dL — ABNORMAL HIGH (ref 70–99)
POTASSIUM: 4.2 meq/L (ref 3.7–5.3)
SODIUM: 140 meq/L (ref 137–147)

## 2013-07-04 LAB — POCT I-STAT TROPONIN I: Troponin i, poc: 0 ng/mL (ref 0.00–0.08)

## 2013-07-04 LAB — D-DIMER, QUANTITATIVE (NOT AT ARMC)

## 2013-07-04 MED ORDER — HYDROCODONE-ACETAMINOPHEN 5-325 MG PO TABS: 2.0000 | ORAL_TABLET | ORAL | Status: DC | PRN

## 2013-07-04 NOTE — Discharge Instructions (Signed)

## 2013-07-04 NOTE — ED Notes (Signed)
Pt. reports left chest pain /  tightness with SOB and left back pain onset this evening , seen at [facility] this evening sent here further evaluation , concerned about PE , pt. has a history of A-Fib currently taking Coumadin . His cardiologist is Dr. Ashok Norris.

## 2013-07-04 NOTE — ED Notes (Signed)
Pain began this morning when he was shaving.  Pain to posterior left chest area.  Increases with inspiration.

## 2013-07-04 NOTE — ED Provider Notes (Signed)
CSN: [id number]     Arrival date & time 07/04/13  1925 History   First MD Initiated Contact with Patient 07/04/13 2133     Chief Complaint  Patient presents with  . Chest Pain   (Consider location/radiation/quality/duration/timing/severity/associated sxs/prior Treatment) HPI Comments: Patient presents to the ER for evaluation of chest pain. Patient was evaluated at his primary care office and sent to the ER. His doctor told him that they were concerned about possible blood clot in his lung.  Patient reports that he had sudden onset of severe sharp spasm-type pain in his left chest and underarm area this morning. He just finished shaving. He refers that he twisted to the left and felt sudden pain in that area. Pain has been present all day. The pain is easing off now, but is still present when he takes a deep breath. He does not feel short of breath.  Patient is a 63 y.o. male presenting with chest pain.  Chest Pain   Past Medical History  Diagnosis Date  . Atrial fibrillation   . Hypertension   . Sleep apnea     cpap  . Arthritis     kness & shoulders   Past Surgical History  Procedure Laterality Date  . Knee arthroscopic    . Colonoscopy     No family history on file. History  Substance Use Topics  . Smoking status: Former Smoker    Quit date: 02/02/2001  . Smokeless tobacco: Never Used  . Alcohol Use: No    Review of Systems  Cardiovascular: Positive for chest pain.  All other systems reviewed and are negative.    Allergies  Review of patient's allergies indicates no known allergies.  Home Medications   Current Outpatient Rx  Name  Route  Sig  Dispense  Refill  . acetaminophen (TYLENOL) 500 MG tablet   Oral   Take 1,000 mg by mouth every 6 (six) hours as needed for headache.         . diltiazem (DILACOR XR) 180 MG 24 hr capsule   Oral   Take 180 mg by mouth daily.         Marland Kitchen losartan (COZAAR) 100 MG tablet   Oral   Take 100 mg by mouth every evening.           . nebivolol (BYSTOLIC) 5 MG tablet   Oral   Take 2.5 mg by mouth every evening.         . warfarin (COUMADIN) 5 MG tablet   Oral   Take 7.5-10 mg by mouth every evening. tue thu sat and sun 1.5 tabs , all other days--2 tabs          BP 132/86  Pulse 61  Temp(Src) 97.8 F (36.6 C) (Oral)  Resp 19  SpO2 98% Physical Exam  Constitutional: He is oriented to person, place, and time. He appears well-developed and well-nourished. No distress.  HENT:  Head: Normocephalic and atraumatic.  Right Ear: Hearing normal.  Left Ear: Hearing normal.  Nose: Nose normal.  Mouth/Throat: Oropharynx is clear and moist and mucous membranes are normal.  Eyes: Conjunctivae and EOM are normal. Pupils are equal, round, and reactive to light.  Neck: Normal range of motion. Neck supple.  Cardiovascular: Regular rhythm, S1 normal and S2 normal.  Exam reveals no gallop and no friction rub.   No murmur heard. Pulmonary/Chest: Effort normal and breath sounds normal. No respiratory distress. He exhibits no tenderness.  Abdominal: Soft. Normal appearance and  bowel sounds are normal. There is no hepatosplenomegaly. There is no tenderness. There is no rebound, no guarding, no tenderness at McBurney's point and negative Murphy's sign. No hernia.  Musculoskeletal: Normal range of motion.  Neurological: He is alert and oriented to person, place, and time. He has normal strength. No cranial nerve deficit or sensory deficit. Coordination normal. GCS eye subscore is 4. GCS verbal subscore is 5. GCS motor subscore is 6.  Skin: Skin is warm, dry and intact. No rash noted. No cyanosis.  Psychiatric: He has a normal mood and affect. His speech is normal and behavior is normal. Thought content normal.    ED Course  Procedures (including critical care time) Labs Review Labs Reviewed  BASIC METABOLIC PANEL - Abnormal; Notable for the following:    Glucose, Bld 109 (*)    GFR calc non Af Amer 67 (*)    GFR  calc Af Amer 77 (*)    All other components within normal limits  CBC  PRO B NATRIURETIC PEPTIDE  D-DIMER, QUANTITATIVE  POCT I-STAT TROPONIN I   Imaging Review Dg Chest 2 View  07/04/2013   CLINICAL DATA:  Chest pain.  EXAM: CHEST  2 VIEW  COMPARISON:  PA and lateral chest 11/04/2012.  FINDINGS: The lungs are clear. Heart size is mildly enlarged. No pneumothorax or pleural effusion. No focal bony abnormality.  IMPRESSION: Mild cardiomegaly without acute disease.   Electronically Signed   By: Inge Rise M.D.   On: 07/04/2013 20:04    EKG Interpretation    Date/Time:  Monday July 04 2013 19:37:36 EST Ventricular Rate:  63 PR Interval:  184 QRS Duration: 98 QT Interval:  416 QTC Calculation: 425 R Axis:   75 Text Interpretation:  Normal sinus rhythm Normal ECG Confirmed by Tresean Mattix  MD, Jaskarn Schweer ([id number]) on 07/04/2013 9:33:39 PM            MDM  Diagnosis: Chest wall pain  Patient sent to ER for further evaluation of sudden onset sharp pain in the left chest area. Patient reports that initially it was severe and worsened if he moved his torso and all. The pain has slowly improved over the course of the day. He now has no tenderness in the area, only slight pain when he takes a very deep breath. This raised suspicion for possible PE and the patient. His d-dimer, however is normal. This is very reassuring. He is also already on Coumadin. All labs were performed through triage by nursing staff, PT was not ordered. I do not feel we need to wait for a repeat PT, because the patient tells me he just had it checked at his clinic and was between 2 and 3, right weren't supposed to be. His anticoagulation makes PE much less likely and with a negative d-dimer, as well as the patient's presentation, I feel that this is musculoskeletal in nature and does not require any further workup.    Orpah Greek, MD 07/04/13 2151

## 2013-07-19 ENCOUNTER — Ambulatory Visit (INDEPENDENT_AMBULATORY_CARE_PROVIDER_SITE_OTHER): Payer: 59 | Admitting: Pharmacist

## 2013-07-19 DIAGNOSIS — I4891 Unspecified atrial fibrillation: Secondary | ICD-10-CM

## 2013-07-19 LAB — POCT INR: INR: 2.4

## 2013-09-14 ENCOUNTER — Emergency Department (HOSPITAL_BASED_OUTPATIENT_CLINIC_OR_DEPARTMENT_OTHER): Payer: Worker's Compensation

## 2013-09-14 ENCOUNTER — Emergency Department (HOSPITAL_BASED_OUTPATIENT_CLINIC_OR_DEPARTMENT_OTHER)
Admission: EM | Admit: 2013-09-14 | Discharge: 2013-09-14 | Disposition: A | Discharge status: Home / Self Care | Payer: Worker's Compensation | Attending: Emergency Medicine | Admitting: Emergency Medicine

## 2013-09-14 ENCOUNTER — Encounter (HOSPITAL_BASED_OUTPATIENT_CLINIC_OR_DEPARTMENT_OTHER): Payer: Self-pay | Admitting: Emergency Medicine

## 2013-09-14 DIAGNOSIS — G4730 Sleep apnea, unspecified: Secondary | ICD-10-CM | POA: Insufficient documentation

## 2013-09-14 DIAGNOSIS — S79919A Unspecified injury of unspecified hip, initial encounter: Secondary | ICD-10-CM | POA: Diagnosis not present

## 2013-09-14 DIAGNOSIS — I4891 Unspecified atrial fibrillation: Secondary | ICD-10-CM | POA: Insufficient documentation

## 2013-09-14 DIAGNOSIS — M19019 Primary osteoarthritis, unspecified shoulder: Secondary | ICD-10-CM | POA: Insufficient documentation

## 2013-09-14 DIAGNOSIS — M1710 Unilateral primary osteoarthritis, unspecified knee: Secondary | ICD-10-CM | POA: Diagnosis not present

## 2013-09-14 DIAGNOSIS — Y990 Civilian activity done for income or pay: Secondary | ICD-10-CM | POA: Diagnosis not present

## 2013-09-14 DIAGNOSIS — Z87891 Personal history of nicotine dependence: Secondary | ICD-10-CM | POA: Insufficient documentation

## 2013-09-14 DIAGNOSIS — Y9289 Other specified places as the place of occurrence of the external cause: Secondary | ICD-10-CM | POA: Diagnosis not present

## 2013-09-14 DIAGNOSIS — E663 Overweight: Secondary | ICD-10-CM | POA: Diagnosis not present

## 2013-09-14 DIAGNOSIS — S4980XA Other specified injuries of shoulder and upper arm, unspecified arm, initial encounter: Secondary | ICD-10-CM | POA: Diagnosis not present

## 2013-09-14 DIAGNOSIS — I10 Essential (primary) hypertension: Secondary | ICD-10-CM | POA: Insufficient documentation

## 2013-09-14 DIAGNOSIS — Z79899 Other long term (current) drug therapy: Secondary | ICD-10-CM | POA: Insufficient documentation

## 2013-09-14 DIAGNOSIS — Y9389 Activity, other specified: Secondary | ICD-10-CM | POA: Diagnosis not present

## 2013-09-14 DIAGNOSIS — S79929A Unspecified injury of unspecified thigh, initial encounter: Secondary | ICD-10-CM

## 2013-09-14 DIAGNOSIS — T148XXA Other injury of unspecified body region, initial encounter: Secondary | ICD-10-CM | POA: Diagnosis not present

## 2013-09-14 DIAGNOSIS — R404 Transient alteration of awareness: Secondary | ICD-10-CM | POA: Insufficient documentation

## 2013-09-14 DIAGNOSIS — S46909A Unspecified injury of unspecified muscle, fascia and tendon at shoulder and upper arm level, unspecified arm, initial encounter: Secondary | ICD-10-CM | POA: Insufficient documentation

## 2013-09-14 DIAGNOSIS — Y929 Unspecified place or not applicable: Secondary | ICD-10-CM | POA: Diagnosis not present

## 2013-09-14 DIAGNOSIS — W19XXXA Unspecified fall, initial encounter: Secondary | ICD-10-CM

## 2013-09-14 DIAGNOSIS — M25559 Pain in unspecified hip: Secondary | ICD-10-CM | POA: Insufficient documentation

## 2013-09-14 DIAGNOSIS — IMO0002 Reserved for concepts with insufficient information to code with codable children: Secondary | ICD-10-CM | POA: Diagnosis present

## 2013-09-14 DIAGNOSIS — Z7901 Long term (current) use of anticoagulants: Secondary | ICD-10-CM | POA: Insufficient documentation

## 2013-09-14 DIAGNOSIS — W1789XA Other fall from one level to another, initial encounter: Secondary | ICD-10-CM | POA: Diagnosis not present

## 2013-09-14 LAB — CBC WITH DIFFERENTIAL/PLATELET
BASOS ABS: 0 10*3/uL (ref 0.0–0.1)
Basophils Relative: 0 % (ref 0–1)
EOS PCT: 11 % — AB (ref 0–5)
Eosinophils Absolute: 0.8 10*3/uL — ABNORMAL HIGH (ref 0.0–0.7)
HEMATOCRIT: 40.2 % (ref 39.0–52.0)
HEMOGLOBIN: 13.7 g/dL (ref 13.0–17.0)
LYMPHS ABS: 1.8 10*3/uL (ref 0.7–4.0)
LYMPHS PCT: 25 % (ref 12–46)
MCH: 30.1 pg (ref 26.0–34.0)
MCHC: 34.1 g/dL (ref 30.0–36.0)
MCV: 88.4 fL (ref 78.0–100.0)
MONO ABS: 0.4 10*3/uL (ref 0.1–1.0)
MONOS PCT: 5 % (ref 3–12)
Neutro Abs: 4.3 10*3/uL (ref 1.7–7.7)
Neutrophils Relative %: 59 % (ref 43–77)
PLATELETS: 272 10*3/uL (ref 150–400)
RBC: 4.55 MIL/uL (ref 4.22–5.81)
RDW: 12.2 % (ref 11.5–15.5)
WBC: 7.2 10*3/uL (ref 4.0–10.5)

## 2013-09-14 LAB — PROTIME-INR
INR: 2.23 — ABNORMAL HIGH (ref 0.00–1.49)
PROTHROMBIN TIME: 24 s — AB (ref 11.6–15.2)

## 2013-09-14 LAB — BASIC METABOLIC PANEL
BUN: 11 mg/dL (ref 6–23)
CO2: 26 mEq/L (ref 19–32)
CREATININE: 0.9 mg/dL (ref 0.50–1.35)
Calcium: 9.3 mg/dL (ref 8.4–10.5)
Chloride: 101 mEq/L (ref 96–112)
GFR, EST NON AFRICAN AMERICAN: 89 mL/min — AB (ref >90)
Glucose, Bld: 101 mg/dL — ABNORMAL HIGH (ref 70–99)
Potassium: 3.8 mEq/L (ref 3.7–5.3)
Sodium: 139 mEq/L (ref 137–147)

## 2013-09-14 LAB — URINALYSIS, ROUTINE W REFLEX MICROSCOPIC
Bilirubin Urine: NEGATIVE
GLUCOSE, UA: NEGATIVE mg/dL
HGB URINE DIPSTICK: NEGATIVE
Ketones, ur: NEGATIVE mg/dL
Leukocytes, UA: NEGATIVE
Nitrite: NEGATIVE
PROTEIN: NEGATIVE mg/dL
Specific Gravity, Urine: 1.009 (ref 1.005–1.030)
UROBILINOGEN UA: 0.2 mg/dL (ref 0.0–1.0)
pH: 6 (ref 5.0–8.0)

## 2013-09-14 MED ORDER — OXYCODONE-ACETAMINOPHEN 5-325 MG PO TABS: 1.0000 | ORAL_TABLET | Freq: Four times a day (QID) | ORAL | Status: DC | PRN

## 2013-09-14 MED ORDER — IOHEXOL 300 MG/ML  SOLN
100.0000 mL | Freq: Once | INTRAMUSCULAR | Status: AC | PRN
Start: 2013-09-14 — End: 2013-09-14
  Administered 2013-09-14: 100 mL via INTRAVENOUS

## 2013-09-14 MED ORDER — OXYCODONE-ACETAMINOPHEN 5-325 MG PO TABS
1.0000 | ORAL_TABLET | Freq: Once | ORAL | Status: AC
Start: 2013-09-14 — End: 2013-09-14
  Administered 2013-09-14: 1 via ORAL
  Filled 2013-09-14: qty 1

## 2013-09-14 NOTE — ED Provider Notes (Signed)
CSN: [id number]     Arrival date & time 09/14/13  1123 History   First MD Initiated Contact with Patient 09/14/13 1131     Chief Complaint  Patient presents with  . Fall  . Back Pain     (Consider location/radiation/quality/duration/timing/severity/associated sxs/prior Treatment) HPI  This is a 63 year old male with a history of atrial fibrillation on Coumadin who presents following a fall at work. 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 He denies loss of consciousness. He denies hitting his head. He states that he "doesn't remember the events immediately after the fall." Patient reports left-sided back pain and right shoulder pain. He has been ambulatory.  Past Medical History  Diagnosis Date  . Atrial fibrillation   . Hypertension   . Sleep apnea     cpap  . Arthritis     kness & shoulders   Past Surgical History  Procedure Laterality Date  . Knee arthroscopic    . Colonoscopy     No family history on file. History  Substance Use Topics  . Smoking status: Former Smoker    Quit date: 02/02/2001  . Smokeless tobacco: Never Used  . Alcohol Use: No    Review of Systems  Constitutional: Negative.  Negative for fever.  Respiratory: Negative.  Negative for chest tightness and shortness of breath.   Cardiovascular: Negative.  Negative for chest pain.  Gastrointestinal: Negative.  Negative for nausea, vomiting, abdominal pain and diarrhea.  Genitourinary: Negative.  Negative for dysuria.  Musculoskeletal: Positive for back pain. Negative for neck pain.       Right shoulder pain  Skin: Negative for wound.  Neurological: Negative for headaches.  All other systems reviewed and are negative.      Allergies  Review of patient's allergies indicates no known allergies.  Home Medications   Current Outpatient Rx  Name  Route  Sig  Dispense  Refill  . acetaminophen (TYLENOL) 500 MG tablet   Oral    Take 1,000 mg by mouth every 6 (six) hours as needed for headache.         . diltiazem (DILACOR XR) 180 MG 24 hr capsule   Oral   Take 180 mg by mouth daily.         Marland Kitchen HYDROcodone-acetaminophen (NORCO/VICODIN) 5-325 MG per tablet   Oral   Take 2 tablets by mouth every 4 (four) hours as needed for moderate pain.   14 tablet   0   . losartan (COZAAR) 100 MG tablet   Oral   Take 100 mg by mouth every evening.          . nebivolol (BYSTOLIC) 5 MG tablet   Oral   Take 2.5 mg by mouth every evening.         Marland Kitchen oxyCODONE-acetaminophen (PERCOCET/ROXICET) 5-325 MG per tablet   Oral   Take 1 tablet by mouth every 6 (six) hours as needed for severe pain.   15 tablet   0   . warfarin (COUMADIN) 5 MG tablet   Oral   Take 7.5-10 mg by mouth every evening. tue thu sat and sun 1.5 tabs , all other days--2 tabs          BP 159/102  Pulse 64  Temp(Src) 98.2 F (36.8 C) (Oral)  Resp 22  Ht 6\' 2"  (1.88 m)  Wt 342 lb (155.13 kg)  BMI 43.89 kg/m2  SpO2 99% Physical  Exam  Nursing note and vitals reviewed. Constitutional: He is oriented to person, place, and time. He appears well-developed and well-nourished. No distress.  Overweight  HENT:  Head: Normocephalic and atraumatic.  Eyes: Pupils are equal, round, and reactive to light.  Neck: Neck supple.  C. collar in place  Cardiovascular: Normal rate, regular rhythm and normal heart sounds.   No murmur heard. Pulmonary/Chest: Effort normal and breath sounds normal. No respiratory distress. He has no wheezes.  Abdominal: Soft. Bowel sounds are normal. There is no tenderness. There is no rebound.  Musculoskeletal: He exhibits no edema.  Tenderness to palpation over the left hip and left SI joint, full range of motion of the bilateral hips and knees, no obvious deformity, tenderness palpation of the left flank, 2+ bilateral DP pulses, neurovascularly intact in the bilateral lower extremities  Lymphadenopathy:    He has no cervical  adenopathy.  Neurological: He is alert and oriented to person, place, and time.  Skin: Skin is warm and dry.  No obvious abrasion or contusion  Psychiatric: He has a normal mood and affect.    ED Course  Procedures (including critical care time) Labs Review Labs Reviewed  CBC WITH DIFFERENTIAL - Abnormal; Notable for the following:    Eosinophils Relative 11 (*)    Eosinophils Absolute 0.8 (*)    All other components within normal limits  BASIC METABOLIC PANEL - Abnormal; Notable for the following:    Glucose, Bld 101 (*)    GFR calc non Af Amer 89 (*)    All other components within normal limits  PROTIME-INR - Abnormal; Notable for the following:    Prothrombin Time 24.0 (*)    INR 2.23 (*)    All other components within normal limits  URINALYSIS, ROUTINE W REFLEX MICROSCOPIC   Imaging Review Dg Lumbar Spine Complete  09/14/2013   CLINICAL DATA:  Lower back pain after fall.  EXAM: LUMBAR SPINE - COMPLETE 4+ VIEW  COMPARISON:  None.  FINDINGS: No fracture or spondylolisthesis is noted. Mild osteophyte formation is seen anteriorly at L3-4. Minimal anterior osteophyte formation is noted at L1-2, L2-3 and L4-5. No fracture or spondylolisthesis is noted. Mild hypertrophy of posterior facet joints is noted at L4-5 and L5-S1 consistent with degenerative joint disease.  IMPRESSION: Mild degenerative changes as described above. No acute abnormality seen in the lumbar spine.   Electronically Signed   By: Sabino Dick M.D.   On: 09/14/2013 12:39   Dg Shoulder Right  09/14/2013   CLINICAL DATA:  Right shoulder pain after fall.  EXAM: RIGHT SHOULDER - 2+ VIEW  COMPARISON:  January 10, 2012.  FINDINGS: There is no evidence of fracture or dislocation. Visualized ribs appear normal. Mild degenerative changes seen involving the right acromioclavicular joint. Soft tissues are unremarkable.  IMPRESSION: Mild degenerative change seen involving right acromioclavicular joint. No acute abnormality seen in the  right shoulder.   Electronically Signed   By: Sabino Dick M.D.   On: 09/14/2013 12:27   Dg Hip Complete Left  09/14/2013   CLINICAL DATA:  Left hip pain after fall.  EXAM: LEFT HIP - COMPLETE 2+ VIEW  COMPARISON:  None.  FINDINGS: There is no evidence of hip fracture or dislocation. There is no evidence of arthropathy or other focal bone abnormality.  IMPRESSION: Normal left hip.   Electronically Signed   By: Sabino Dick M.D.   On: 09/14/2013 12:35   Ct Head Wo Contrast  09/14/2013   CLINICAL DATA:  Pain post trauma  EXAM: CT HEAD WITHOUT CONTRAST  CT CERVICAL SPINE WITHOUT CONTRAST  TECHNIQUE: Multidetector CT imaging of the head and cervical spine was performed following the standard protocol without intravenous contrast. Multiplanar CT image reconstructions of the cervical spine were also generated.  COMPARISON:  None.  FINDINGS: CT HEAD FINDINGS  There is age related volume loss. There is no mass, hemorrhage, extra-axial fluid collection, or midline shift. Gray-white compartments appear normal. There is no demonstrable acute infarct. The bony calvarium appears intact. The mastoid air cells are clear. There is mild mucosal thickening in both ethmoid sinus regions.  CT CERVICAL SPINE FINDINGS  There is no fracture or spondylolisthesis. Prevertebral soft tissues and predental space regions are normal. There are prominent anterior osteophytes at C2, C3, C5, and C6. There is no appreciable disc space narrowing. There is mild facet hypertrophy at multiple levels bilaterally. No disc extrusion or stenosis. No paraspinous lesions are identified. There are several areas of nuchal ligament calcification posteriorly.  IMPRESSION: CT head: Age related volume loss. Mild ethmoid sinus disease. No intracranial mass, hemorrhage, or extra-axial fluid.  CT cervical spine: Areas of osteoarthritic change. No fracture or spondylolisthesis.   Electronically Signed   By: Lowella Grip M.D.   On: 09/14/2013 12:48   Ct  Cervical Spine Wo Contrast  09/14/2013   : CT head and CT cervical spine reports are combined into a single dictation.   Electronically Signed   By: Lowella Grip M.D.   On: 09/14/2013 12:48   Ct Abdomen Pelvis W Contrast  09/14/2013   CLINICAL DATA:  Fall from approximately 5 foot height. Left-sided abdominal and back pain.  EXAM: CT ABDOMEN AND PELVIS WITH CONTRAST  TECHNIQUE: Multidetector CT imaging of the abdomen and pelvis was performed using the standard protocol following bolus administration of intravenous contrast.  CONTRAST:  159mL OMNIPAQUE IOHEXOL 300 MG/ML  SOLN  COMPARISON:  None.  FINDINGS: No evidence of lacerations or contusions to the abdominal parenchymal organs. No evidence of hemoperitoneum or retroperitoneal hemorrhage.  Contusion is seen within the subcutaneous tissues of the left lower quadrant posterior abdominal wall and superior left buttock. No acute fractures are identified.  The liver, gallbladder, pancreas, spleen, adrenal glands, and right kidney are normal in appearance. A tiny cyst is seen in the anterior upper pole the left kidney, however there is no evidence of renal masses or hydronephrosis. No other soft tissue masses or lymphadenopathy identified. No evidence of inflammatory process or abnormal fluid collections. No evidence of dilated bowel loops.  IMPRESSION: Soft tissue contusion in the left posterior abdominal wall and superior left buttock subcutaneous tissues.  No evidence of visceral injury, hemoperitoneum, or other acute findings.   Electronically Signed   By: Earle Gell M.D.   On: 09/14/2013 15:33     EKG Interpretation None      MDM   Final diagnoses:  Fall  Contusion    Patient presents following a fall from 5 feet. He is nontoxic on exam. He is currently on Coumadin. There is no outward evidence of trauma. C-collar was placed prior to my arrival. Given Coumadin use and amnesia to events following a fall, will obtain CT head and neck. Plain  films also obtained. Basic labwork was obtained and is reassuring. INR is therapeutic at 2.23.  On repeat exam, patient continues to have left flank tenderness. There is no obvious hematoma or contusion. However, patient is at risk for retroperitoneal hematoma. We'll obtain CT scan. CT scan  is negative. Patient has been ambulatory. Work documentation has been filled out and patient can return to work on Monday. Have instructed the patient to followup with his primary physician on Friday.  After history, exam, and medical workup I feel the patient has been appropriately medically screened and is safe for discharge home. Pertinent diagnoses were discussed with the patient. Patient was given return precautions.     Merryl Hacker, MD 09/14/13 [phone]

## 2013-09-14 NOTE — Discharge Instructions (Signed)

## 2013-09-14 NOTE — ED Notes (Signed)
Pt reports he fell out of a bucket while at work approx 5 feet into back of truck.  Pt has back an right shoulder pain.

## 2013-09-14 NOTE — ED Notes (Signed)
Patient transported to CT 

## 2013-09-16 ENCOUNTER — Emergency Department (HOSPITAL_BASED_OUTPATIENT_CLINIC_OR_DEPARTMENT_OTHER)
Admission: EM | Admit: 2013-09-16 | Discharge: 2013-09-16 | Disposition: A | Discharge status: Home / Self Care | Payer: Worker's Compensation | Attending: Emergency Medicine | Admitting: Emergency Medicine

## 2013-09-16 ENCOUNTER — Encounter (HOSPITAL_BASED_OUTPATIENT_CLINIC_OR_DEPARTMENT_OTHER): Payer: Self-pay | Admitting: Emergency Medicine

## 2013-09-16 DIAGNOSIS — Z87891 Personal history of nicotine dependence: Secondary | ICD-10-CM | POA: Insufficient documentation

## 2013-09-16 DIAGNOSIS — Y9389 Activity, other specified: Secondary | ICD-10-CM | POA: Insufficient documentation

## 2013-09-16 DIAGNOSIS — Z7901 Long term (current) use of anticoagulants: Secondary | ICD-10-CM | POA: Insufficient documentation

## 2013-09-16 DIAGNOSIS — I4891 Unspecified atrial fibrillation: Secondary | ICD-10-CM | POA: Insufficient documentation

## 2013-09-16 DIAGNOSIS — M19019 Primary osteoarthritis, unspecified shoulder: Secondary | ICD-10-CM | POA: Diagnosis not present

## 2013-09-16 DIAGNOSIS — Z9981 Dependence on supplemental oxygen: Secondary | ICD-10-CM | POA: Insufficient documentation

## 2013-09-16 DIAGNOSIS — Y990 Civilian activity done for income or pay: Secondary | ICD-10-CM | POA: Insufficient documentation

## 2013-09-16 DIAGNOSIS — M1710 Unilateral primary osteoarthritis, unspecified knee: Secondary | ICD-10-CM | POA: Diagnosis not present

## 2013-09-16 DIAGNOSIS — S20229A Contusion of unspecified back wall of thorax, initial encounter: Secondary | ICD-10-CM | POA: Insufficient documentation

## 2013-09-16 DIAGNOSIS — I10 Essential (primary) hypertension: Secondary | ICD-10-CM | POA: Insufficient documentation

## 2013-09-16 DIAGNOSIS — Y9289 Other specified places as the place of occurrence of the external cause: Secondary | ICD-10-CM | POA: Insufficient documentation

## 2013-09-16 DIAGNOSIS — Z79899 Other long term (current) drug therapy: Secondary | ICD-10-CM | POA: Diagnosis not present

## 2013-09-16 DIAGNOSIS — G4730 Sleep apnea, unspecified: Secondary | ICD-10-CM | POA: Insufficient documentation

## 2013-09-16 DIAGNOSIS — S300XXA Contusion of lower back and pelvis, initial encounter: Secondary | ICD-10-CM

## 2013-09-16 DIAGNOSIS — IMO0002 Reserved for concepts with insufficient information to code with codable children: Secondary | ICD-10-CM | POA: Diagnosis present

## 2013-09-16 DIAGNOSIS — R296 Repeated falls: Secondary | ICD-10-CM | POA: Insufficient documentation

## 2013-09-16 LAB — CBC
HEMATOCRIT: 38.4 % — AB (ref 39.0–52.0)
Hemoglobin: 13.2 g/dL (ref 13.0–17.0)
MCH: 30.2 pg (ref 26.0–34.0)
MCHC: 34.4 g/dL (ref 30.0–36.0)
MCV: 87.9 fL (ref 78.0–100.0)
PLATELETS: 256 10*3/uL (ref 150–400)
RBC: 4.37 MIL/uL (ref 4.22–5.81)
RDW: 11.9 % (ref 11.5–15.5)
WBC: 6.2 10*3/uL (ref 4.0–10.5)

## 2013-09-16 LAB — PROTIME-INR
INR: 1.76 — ABNORMAL HIGH (ref 0.00–1.49)
Prothrombin Time: 20 seconds — ABNORMAL HIGH (ref 11.6–15.2)

## 2013-09-16 NOTE — ED Notes (Signed)
Low back pain and increased bruising on low back.  Sustained a fall Wednesday.

## 2013-09-16 NOTE — ED Provider Notes (Signed)
CSN: [id number]     Arrival date & time 09/16/13  4166 History   First MD Initiated Contact with Patient 09/16/13 [phone]     No chief complaint on file.    (Consider location/radiation/quality/duration/timing/severity/associated sxs/prior Treatment) Patient is a 63 y.o. male presenting with back pain.  Back Pain  Pt with history of afib on coumadin had a fall at work 2 days ago, about 5 feet onto his back. Seen in the ED that day and had neg CT head, neck abd/pel. HE noted some bruising to his sacrum later that night and then last night noticed increased pain and swelling to L lower back. Pain is mild-moderate, worse with movement. Returns for recheck. Denies lightheadedness, CP, SOB, syncope.   Past Medical History  Diagnosis Date  . Atrial fibrillation   . Hypertension   . Sleep apnea     cpap  . Arthritis     kness & shoulders   Past Surgical History  Procedure Laterality Date  . Knee arthroscopic    . Colonoscopy     No family history on file. History  Substance Use Topics  . Smoking status: Former Smoker    Quit date: 02/02/2001  . Smokeless tobacco: Never Used  . Alcohol Use: No    Review of Systems  Musculoskeletal: Positive for back pain.   All other systems reviewed and are negative except as noted in HPI.     Allergies  Review of patient's allergies indicates no known allergies.  Home Medications   Current Outpatient Rx  Name  Route  Sig  Dispense  Refill  . acetaminophen (TYLENOL) 500 MG tablet   Oral   Take 1,000 mg by mouth every 6 (six) hours as needed for headache.         . diltiazem (DILACOR XR) 180 MG 24 hr capsule   Oral   Take 180 mg by mouth daily.         Marland Kitchen HYDROcodone-acetaminophen (NORCO/VICODIN) 5-325 MG per tablet   Oral   Take 2 tablets by mouth every 4 (four) hours as needed for moderate pain.   14 tablet   0   . losartan (COZAAR) 100 MG tablet   Oral   Take 100 mg by mouth every evening.          . nebivolol  (BYSTOLIC) 5 MG tablet   Oral   Take 2.5 mg by mouth every evening.         Marland Kitchen oxyCODONE-acetaminophen (PERCOCET/ROXICET) 5-325 MG per tablet   Oral   Take 1 tablet by mouth every 6 (six) hours as needed for severe pain.   15 tablet   0   . warfarin (COUMADIN) 5 MG tablet   Oral   Take 7.5-10 mg by mouth every evening. tue thu sat and sun 1.5 tabs , all other days--2 tabs          There were no vitals taken for this visit. Physical Exam  Nursing note and vitals reviewed. Constitutional: He is oriented to person, place, and time. He appears well-developed and well-nourished.  HENT:  Head: Normocephalic and atraumatic.  Eyes: EOM are normal. Pupils are equal, round, and reactive to light.  Neck: Normal range of motion. Neck supple.  Cardiovascular: Normal rate, normal heart sounds and intact distal pulses.   Pulmonary/Chest: Effort normal and breath sounds normal.  Abdominal: Bowel sounds are normal. He exhibits no distension. There is no tenderness.  Musculoskeletal: Normal range of motion. He exhibits tenderness (L  lower back, mild swelling, muscle spasm). He exhibits no edema.  Ecchymosis to sacrum is non-tender  Neurological: He is alert and oriented to person, place, and time. He has normal strength. No cranial nerve deficit or sensory deficit.  Skin: Skin is warm and dry. No rash noted.  Psychiatric: He has a normal mood and affect.    ED Course  Procedures (including critical care time) Labs Review Labs Reviewed  CBC  PROTIME-INR   Imaging Review    EKG Interpretation None      MDM   Final diagnoses:  Contusion of lower back    Labs unchanged. Pt advised rest, heating pad, pain meds if needed and Return for any other concerns.   Charles B. Karle Starch, MD 09/16/13 [phone]

## 2013-09-16 NOTE — Discharge Instructions (Signed)

## 2013-09-19 ENCOUNTER — Other Ambulatory Visit: Payer: Self-pay | Admitting: *Deleted

## 2013-09-19 MED ORDER — WARFARIN SODIUM 5 MG PO TABS: ORAL_TABLET | ORAL | Status: DC

## 2013-09-21 ENCOUNTER — Encounter: Payer: Self-pay | Admitting: General Surgery

## 2013-09-21 ENCOUNTER — Ambulatory Visit (INDEPENDENT_AMBULATORY_CARE_PROVIDER_SITE_OTHER): Payer: 59 | Admitting: Pharmacist

## 2013-09-21 DIAGNOSIS — I4891 Unspecified atrial fibrillation: Secondary | ICD-10-CM

## 2013-09-21 LAB — POCT INR: INR: 2

## 2013-09-21 MED ORDER — WARFARIN SODIUM 5 MG PO TABS: ORAL_TABLET | ORAL | Status: DC

## 2013-09-25 ENCOUNTER — Encounter: Payer: Self-pay | Admitting: *Deleted

## 2013-09-27 ENCOUNTER — Encounter: Payer: Self-pay | Admitting: Cardiology

## 2013-09-27 ENCOUNTER — Ambulatory Visit (INDEPENDENT_AMBULATORY_CARE_PROVIDER_SITE_OTHER): Payer: 59 | Admitting: Cardiology

## 2013-09-27 VITALS — BP 140/78 | HR 60 | Ht 73.0 in | Wt 333.0 lb

## 2013-09-27 DIAGNOSIS — G4733 Obstructive sleep apnea (adult) (pediatric): Secondary | ICD-10-CM

## 2013-09-27 DIAGNOSIS — I10 Essential (primary) hypertension: Secondary | ICD-10-CM | POA: Insufficient documentation

## 2013-09-27 DIAGNOSIS — I4891 Unspecified atrial fibrillation: Secondary | ICD-10-CM

## 2013-09-27 NOTE — Progress Notes (Signed)
Alec Hunt, [address], [place]  [address] Phone: [phone] Fax:  [phone]  Date:  09/27/2013   ID:  [name], DOB 08/28/1950, MRN [id number]  PCP:  Gavin Pound, MD  Cardiologist:  Fransico Him, MD     History of Present Illness: Alec Hunt is a 63 y.o. male with a history of PAF, OSA on BiPAP, HTN and morbid obesity who presents today for followup.  He is doing well.  He denies any chest pain, SOB, DOE, LE edema, dizziness, palpitations or syncope.  He tolerates his BiPAP and feels the pressure is adequate.  He feels rested in the am and has no daytime sleepiness.  He tolerates the full face mask without any problems.  Since I saw him last he fell 5 feet on the job right when he was getting ready to retire.  He injured his low back and tore his rotator cuff.     Wt Readings from Last 3 Encounters:  09/16/13 342 lb (155.13 kg)  09/14/13 342 lb (155.13 kg)  11/05/12 339 lb 4.6 oz (153.9 kg)     Past Medical History  Diagnosis Date  . Atrial fibrillation   . Sleep apnea     cpap  . Arthritis     kness & shoulders  . PAF (paroxysmal atrial fibrillation)   . GERD (gastroesophageal reflux disease)   . Obesity   . Syncope     history of in the setting of afib  . OSA (obstructive sleep apnea)     Severe w AHI 81.28/hr now on Bipap  . Hypertension     Current Outpatient Prescriptions  Medication Sig Dispense Refill  . acetaminophen (TYLENOL) 500 MG tablet Take 1,000 mg by mouth every 6 (six) hours as needed for headache.      . diltiazem (DILACOR XR) 180 MG 24 hr capsule Take 180 mg by mouth daily.      Marland Kitchen HYDROcodone-acetaminophen (NORCO/VICODIN) 5-325 MG per tablet Take 2 tablets by mouth every 4 (four) hours as needed for moderate pain.  14 tablet  0  . losartan (COZAAR) 100 MG tablet Take 100 mg by mouth every evening.       . nebivolol (BYSTOLIC) 5 MG tablet Take 2.5 mg by mouth every evening.      Marland Kitchen oxyCODONE-acetaminophen (PERCOCET/ROXICET)  5-325 MG per tablet Take 1 tablet by mouth every 6 (six) hours as needed for severe pain.  15 tablet  0  . warfarin (COUMADIN) 5 MG tablet 1.5 tablets on all days except 2 tablets on Monday, Wednesday, and Friday or as directed by coumadin clinic  60 tablet  3   No current facility-administered medications for this visit.    Allergies:   No Known Allergies  Social History:  The patient  reports that he quit smoking about 12 years ago. He has never used smokeless tobacco. He reports that he does not drink alcohol or use illicit drugs.   Family History:  The patient's family history includes CAD in his mother; Heart attack in his mother; Heart disease in his mother; Hypertension in his brother.   ROS:  Please see the history of present illness.      All other systems reviewed and negative.   PHYSICAL EXAM: VS:  There were no vitals taken for this visit. Well nourished, well developed, in no acute distress HEENT: normal Neck: no JVD Cardiac:  normal S1, S2; RRR; no murmur Lungs:  clear to auscultation bilaterally, no  wheezing, rhonchi or rales Abd: soft, nontender, no hepatomegaly Ext: no edema Skin: warm and dry Neuro:  CNs 2-12 intact, no focal abnormalities noted    ASSESSMENT AND PLAN:  1. OSA on BiPAP and tolerating well - He will drop off his SD card at [facility] tomorrow 2. HTN well controlled - continue Diltiazem/losartan/Bystolic - check BMET today 3. PAF maintaining NSR - continue Bystolic/warfarin 4. Obesity 5. Chronic systemic anticoagulation  Followup with me in 6 months  Signed, Fransico Him, MD 09/27/2013 3:28 PM

## 2013-09-27 NOTE — Patient Instructions (Signed)
Will obtain labs today and call you with the results (bmet)  Your physician recommends that you continue on your current medications as directed. Please refer to the Current Medication list given to you today.  Your physician wants you to follow-up in: 6 month  You will receive a reminder letter in the mail two months in advance. If you don't receive a letter, please call our office to schedule the follow-up appointment.

## 2013-09-28 LAB — BASIC METABOLIC PANEL
BUN: 13 mg/dL (ref 6–23)
CO2: 27 meq/L (ref 19–32)
CREATININE: 1 mg/dL (ref 0.4–1.5)
Calcium: 9.3 mg/dL (ref 8.4–10.5)
Chloride: 104 mEq/L (ref 96–112)
GFR: 93.61 mL/min (ref >60.00)
Glucose, Bld: 106 mg/dL — ABNORMAL HIGH (ref 70–99)
Potassium: 3.7 mEq/L (ref 3.5–5.1)
Sodium: 137 mEq/L (ref 135–145)

## 2013-09-28 NOTE — Progress Notes (Signed)
Quick Note:  Preliminary report reviewed by triage nurse and sent to MD desk. ______ 

## 2013-10-18 ENCOUNTER — Other Ambulatory Visit: Payer: Self-pay | Admitting: Cardiology

## 2013-10-28 DIAGNOSIS — Z87898 Personal history of other specified conditions: Secondary | ICD-10-CM

## 2013-10-28 HISTORY — DX: Personal history of other specified conditions: Z87.898

## 2013-10-31 ENCOUNTER — Telehealth: Payer: Self-pay | Admitting: Cardiology

## 2013-10-31 NOTE — Telephone Encounter (Signed)
Received request from Nurse fax box, documents faxed for surgical clearance. To: [employer] Fax number: [phone] Attention: 5.4.15/kdm

## 2013-11-02 ENCOUNTER — Telehealth: Payer: Self-pay | Admitting: Cardiology

## 2013-11-02 ENCOUNTER — Ambulatory Visit (INDEPENDENT_AMBULATORY_CARE_PROVIDER_SITE_OTHER): Payer: 59 | Admitting: Pharmacist

## 2013-11-02 DIAGNOSIS — I4891 Unspecified atrial fibrillation: Secondary | ICD-10-CM

## 2013-11-02 LAB — POCT INR: INR: 2.2

## 2013-11-02 NOTE — Telephone Encounter (Signed)
Patient needs to hold warfarin 5 days prior to rotator cuff surgery on 11/14/13.  Patient is on warfarin for h/o Afib, and has no h/o embolic event.  Patient has been notified to hold warfarin 5 days prior to surgery, unless he hears otherwise.    Dr. Radford Pax, any objection to this?

## 2013-11-02 NOTE — Telephone Encounter (Signed)
Ok to proceed. 

## 2013-11-02 NOTE — Telephone Encounter (Signed)
TO Alec Hunt to advise how many days prior to procedure to be off coumadin. Sent Sleep study to [facility] for pt.

## 2013-11-02 NOTE — Telephone Encounter (Signed)
New message    Clearance letter was receive back from cardiac stand point .   Need a copy of recent sleep study was not attached to clearance.    Need to stop coumadin how many days prior to surgery.

## 2013-11-30 ENCOUNTER — Ambulatory Visit (INDEPENDENT_AMBULATORY_CARE_PROVIDER_SITE_OTHER): Payer: 59 | Admitting: Pharmacist

## 2013-11-30 DIAGNOSIS — I4891 Unspecified atrial fibrillation: Secondary | ICD-10-CM

## 2013-11-30 LAB — POCT INR: INR: 2

## 2013-12-21 ENCOUNTER — Ambulatory Visit (INDEPENDENT_AMBULATORY_CARE_PROVIDER_SITE_OTHER): Payer: 59 | Admitting: Pharmacist

## 2013-12-21 DIAGNOSIS — I4891 Unspecified atrial fibrillation: Secondary | ICD-10-CM

## 2013-12-21 LAB — POCT INR: INR: 2.3

## 2013-12-22 ENCOUNTER — Encounter: Payer: Self-pay | Admitting: Cardiology

## 2014-02-01 ENCOUNTER — Ambulatory Visit (INDEPENDENT_AMBULATORY_CARE_PROVIDER_SITE_OTHER): Payer: 59 | Admitting: Pharmacist

## 2014-02-01 DIAGNOSIS — I4891 Unspecified atrial fibrillation: Secondary | ICD-10-CM

## 2014-02-01 LAB — POCT INR: INR: 3.2

## 2014-02-14 ENCOUNTER — Telehealth: Payer: Self-pay | Admitting: Cardiology

## 2014-02-14 NOTE — Telephone Encounter (Signed)
Walk In pt Form " [facility]" paper dropped Off gave to [place] 8.18.15/km

## 2014-03-08 ENCOUNTER — Ambulatory Visit (INDEPENDENT_AMBULATORY_CARE_PROVIDER_SITE_OTHER): Payer: Worker's Compensation | Admitting: Pharmacist Clinician (PhC)/ Clinical Pharmacy Specialist

## 2014-03-08 DIAGNOSIS — I4891 Unspecified atrial fibrillation: Secondary | ICD-10-CM

## 2014-03-08 LAB — POCT INR: INR: 1.1

## 2014-03-15 ENCOUNTER — Ambulatory Visit (INDEPENDENT_AMBULATORY_CARE_PROVIDER_SITE_OTHER): Payer: 59 | Admitting: *Deleted

## 2014-03-15 DIAGNOSIS — I4891 Unspecified atrial fibrillation: Secondary | ICD-10-CM

## 2014-03-15 LAB — POCT INR: INR: 1.7

## 2014-03-18 ENCOUNTER — Other Ambulatory Visit: Payer: Self-pay | Admitting: Cardiology

## 2014-03-22 ENCOUNTER — Encounter: Payer: Self-pay | Admitting: Cardiology

## 2014-03-22 ENCOUNTER — Ambulatory Visit (INDEPENDENT_AMBULATORY_CARE_PROVIDER_SITE_OTHER): Payer: 59 | Admitting: Surgery

## 2014-03-22 ENCOUNTER — Ambulatory Visit (INDEPENDENT_AMBULATORY_CARE_PROVIDER_SITE_OTHER): Payer: 59 | Admitting: Cardiology

## 2014-03-22 VITALS — BP 132/70 | HR 66 | Ht 73.0 in | Wt 342.0 lb

## 2014-03-22 DIAGNOSIS — I4891 Unspecified atrial fibrillation: Secondary | ICD-10-CM

## 2014-03-22 DIAGNOSIS — I480 Paroxysmal atrial fibrillation: Secondary | ICD-10-CM

## 2014-03-22 DIAGNOSIS — I10 Essential (primary) hypertension: Secondary | ICD-10-CM

## 2014-03-22 DIAGNOSIS — G4733 Obstructive sleep apnea (adult) (pediatric): Secondary | ICD-10-CM

## 2014-03-22 LAB — BASIC METABOLIC PANEL
BUN: 11 mg/dL (ref 6–23)
CALCIUM: 9.2 mg/dL (ref 8.4–10.5)
CO2: 25 meq/L (ref 19–32)
Chloride: 104 mEq/L (ref 96–112)
Creatinine, Ser: 1 mg/dL (ref 0.4–1.5)
GFR: 96.71 mL/min (ref >60.00)
GLUCOSE: 126 mg/dL — AB (ref 70–99)
POTASSIUM: 3.8 meq/L (ref 3.5–5.1)
SODIUM: 136 meq/L (ref 135–145)

## 2014-03-22 LAB — POCT INR: INR: 2.5

## 2014-03-22 NOTE — Patient Instructions (Signed)
Your physician recommends that you continue on your current medications as directed. Please refer to the Current Medication list given to you today.  Continue current CPAP/BiPAP Settings. (Bring Download card to [facility] today)  Dr Radford Pax requests that you check your Blood Pressure and Heart Rate when ever you feel dizzy and call us with the results  Your physician recommends that you go to the lab today for a BMET  Your physician wants you to follow-up in: 6 months with Dr Mallie Snooks will receive a reminder letter in the mail two months in advance. If you don't receive a letter, please call our office to schedule the follow-up appointment.

## 2014-03-22 NOTE — Progress Notes (Signed)
[facility] [place], [address], [place]  [address] Phone: [phone] Fax:  [phone]  Date:  03/22/2014   ID:  Alec Hunt, DOB 09-02-1950, MRN [id number]  PCP:  Gavin Pound, MD  Cardiologist:  Fransico Him, MD    History of Present Illness: Alec Hunt is a 63 y.o. male with a history of PAF, OSA on BiPAP, HTN and morbid obesity who presents today for followup. He is doing well. He denies any chest pain, SOB, DOE, LE edema, palpitations or syncope. He has been having problems recently with dizziness when getting out of bed and when going from sitting to standing.  He tolerates his BiPAP and feels the pressure is adequate. He feels rested in the am and has no daytime sleepiness. He tolerates the full face mask without any problems.     Wt Readings from Last 3 Encounters:  03/22/14 342 lb (155.13 kg)  09/27/13 333 lb (151.048 kg)  09/16/13 342 lb (155.13 kg)     Past Medical History  Diagnosis Date  . Atrial fibrillation   . Sleep apnea     cpap  . Arthritis     kness & shoulders  . PAF (paroxysmal atrial fibrillation)   . GERD (gastroesophageal reflux disease)   . Obesity   . Syncope     history of in the setting of afib  . OSA (obstructive sleep apnea)     Severe w AHI 81.28/hr now on Bipap  . Hypertension     Current Outpatient Prescriptions  Medication Sig Dispense Refill  . diltiazem (DILACOR XR) 180 MG 24 hr capsule Take 180 mg by mouth daily.      Marland Kitchen losartan (COZAAR) 100 MG tablet Take 100 mg by mouth every evening.       . nebivolol (BYSTOLIC) 5 MG tablet Take 2.5 mg by mouth every evening.      . warfarin (COUMADIN) 5 MG tablet TAKE 1 & 1/2 TABLETS ON ALL DAYS EXCEPT 2 TABS ON MONDAY, WED. & FRI. OR AS DIRECTED BY CLINIC  60 tablet  3   No current facility-administered medications for this visit.    Allergies:   No Known Allergies  Social History:  The patient  reports that he quit smoking about 13 years ago. He has never used smokeless  tobacco. He reports that he does not drink alcohol or use illicit drugs.   Family History:  The patient's family history includes CAD in his mother; Heart attack in his mother; Heart disease in his mother; Hypertension in his brother.   ROS:  Please see the history of present illness.      All other systems reviewed and negative.   PHYSICAL EXAM: VS:  BP 132/70  Pulse 66  Ht 6\' 1"  (1.854 m)  Wt 342 lb (155.13 kg)  BMI 45.13 kg/m2 Well nourished, well developed, in no acute distress HEENT: normal Neck: no JVD Cardiac:  normal S1, S2; RRR; no murmur Lungs:  clear to auscultation bilaterally, no wheezing, rhonchi or rales Abd: soft, nontender, no hepatomegaly Ext: no edema Skin: warm and dry Neuro:  CNs 2-12 intact, no focal abnormalities noted  ASSESSMENT AND PLAN:  1. OSA on BiPAP and tolerating well - He will drop off his SD card at [facility] tomorrow  2. HTN well controlled  - continue Diltiazem/losartan - check BMET today  3. PAF maintaining NSR - continue Diltiazem/warfarin  4. Obesity 5. Chronic systemic anticoagulation 6. Occasional positional dizziness -  I have asked him to get his PT to check his BP in PT when he feels dizzy in therapy and he is going to get a cuff at home to check his BP when he gets dizzy and he will call me  Followup with me in 6 months       Signed, Fransico Him, MD [facility] 03/22/2014 10:29 AM

## 2014-03-24 ENCOUNTER — Encounter: Payer: Self-pay | Admitting: General Surgery

## 2014-04-12 ENCOUNTER — Ambulatory Visit (INDEPENDENT_AMBULATORY_CARE_PROVIDER_SITE_OTHER): Payer: 59 | Admitting: *Deleted

## 2014-04-12 DIAGNOSIS — I4891 Unspecified atrial fibrillation: Secondary | ICD-10-CM

## 2014-04-12 LAB — POCT INR: INR: 2.1

## 2014-05-03 ENCOUNTER — Ambulatory Visit (INDEPENDENT_AMBULATORY_CARE_PROVIDER_SITE_OTHER): Payer: 59 | Admitting: *Deleted

## 2014-05-03 DIAGNOSIS — I4891 Unspecified atrial fibrillation: Secondary | ICD-10-CM

## 2014-05-03 LAB — POCT INR: INR: 2.6

## 2014-05-31 ENCOUNTER — Ambulatory Visit (INDEPENDENT_AMBULATORY_CARE_PROVIDER_SITE_OTHER): Payer: 59 | Admitting: *Deleted

## 2014-05-31 DIAGNOSIS — I4891 Unspecified atrial fibrillation: Secondary | ICD-10-CM

## 2014-05-31 LAB — POCT INR: INR: 2.7

## 2014-06-05 ENCOUNTER — Telehealth: Payer: Self-pay | Admitting: Cardiology

## 2014-06-05 NOTE — Telephone Encounter (Signed)
Sealed Envelope Dropped Off Katy back Tuesday 12/8 Will give to her then/KM

## 2014-06-07 ENCOUNTER — Telehealth: Payer: Self-pay

## 2014-06-07 NOTE — Telephone Encounter (Signed)
Patient dropped off BP readings at the front desk.  11/12: 120/82 (prior to exercise)   118/ 80 (after exercise)  122/84 (lying down) 11/23: 118/78 (supine after exercise)  122/80 (seated) 11/25: 124/78 (resting)  144/90 (after exercising) 12/1:  120/82  To Dr. Radford Pax for review.

## 2014-06-08 NOTE — Telephone Encounter (Signed)
Good BP no change in meds 

## 2014-06-08 NOTE — Telephone Encounter (Signed)
Left message to call back  

## 2014-06-09 NOTE — Telephone Encounter (Signed)
Please find out if there dizzy spells are at a certain time of the day

## 2014-06-09 NOTE — Telephone Encounter (Signed)
Instructed patient to continue current medications and to move slower, especially when bending over or standing up from sitting position. Patient agrees with treatment plan.

## 2014-06-09 NOTE — Telephone Encounter (Signed)
Per the patient, his dizzy spells are no certain time of the day. He just notices them if he has been bending over and he stands up/ or if he sits up at therapy. He does not have a way to check his BP at home. The readings below listed as "after exercise" are the times they took his readings at therapy and he was having dizziness. Forwarding to Dr. Radford Pax for review.

## 2014-06-09 NOTE — Telephone Encounter (Signed)
I spoke with the patient and he is aware that Dr. Radford Pax has reviewed his readings and recommended no change in therapy. He reports that the main reason for taking his readings was due to the fact that he is having dizziness when he sits up quickly with the therapy he is taking for his back. He also gets this at home if he stands too quickly. He states that he will stand for a few minutes and dizziness will resolve. I advised that based on his readings and the fact that his symptoms resolve quickly, that there may be no need to change his medications. I advised I would review with Dr. Radford Pax since the patient states they had this conversation and she mentioned possibly cutting one of his meds in half. He is already taking bystolic 5 mg 1/2 tablet at night, losartan 100 mg one tablet in the AM, and diltiazem 180 mg one capsule in the PM. We will call him back after Dr. Radford Pax reviews. He is agreeable.

## 2014-06-09 NOTE — Telephone Encounter (Signed)
I would like him to check his BP during the dizzy spells

## 2014-06-09 NOTE — Telephone Encounter (Signed)
I would continue current meds for now

## 2014-06-12 ENCOUNTER — Other Ambulatory Visit: Payer: Self-pay | Admitting: *Deleted

## 2014-06-12 MED ORDER — NEBIVOLOL HCL 5 MG PO TABS: 2.5000 mg | ORAL_TABLET | Freq: Every evening | ORAL | Status: DC

## 2014-06-14 ENCOUNTER — Other Ambulatory Visit: Payer: Self-pay | Admitting: *Deleted

## 2014-06-14 MED ORDER — LOSARTAN POTASSIUM 100 MG PO TABS: 100.0000 mg | ORAL_TABLET | Freq: Every evening | ORAL | Status: DC

## 2014-07-12 ENCOUNTER — Ambulatory Visit (INDEPENDENT_AMBULATORY_CARE_PROVIDER_SITE_OTHER): Payer: Worker's Compensation | Admitting: *Deleted

## 2014-07-12 DIAGNOSIS — I4891 Unspecified atrial fibrillation: Secondary | ICD-10-CM

## 2014-07-12 LAB — POCT INR: INR: 2.1

## 2014-07-13 ENCOUNTER — Encounter (HOSPITAL_COMMUNITY): Payer: Self-pay | Admitting: Cardiology

## 2014-07-14 ENCOUNTER — Telehealth: Payer: Self-pay | Admitting: Cardiology

## 2014-07-14 NOTE — Telephone Encounter (Signed)
Walk in pt form " [facility]" paper dropped off gave to [facility]

## 2014-07-19 ENCOUNTER — Other Ambulatory Visit: Payer: Self-pay | Admitting: Specialist

## 2014-07-19 DIAGNOSIS — M5126 Other intervertebral disc displacement, lumbar region: Secondary | ICD-10-CM

## 2014-07-21 ENCOUNTER — Other Ambulatory Visit: Payer: Self-pay | Admitting: Cardiology

## 2014-07-24 ENCOUNTER — Other Ambulatory Visit: Payer: Self-pay | Admitting: Cardiology

## 2014-08-11 ENCOUNTER — Telehealth: Payer: Self-pay | Admitting: Cardiology

## 2014-08-11 NOTE — Telephone Encounter (Signed)
New message    Patient calling check on the status of paper work that was drop off

## 2014-08-11 NOTE — Telephone Encounter (Signed)
Informed Mr. Alec Hunt that paperwork was sent, but missing information. Apologized to Mr. Alec Hunt and told him it will be sent Monday. Patient grateful for call.

## 2014-08-14 ENCOUNTER — Telehealth: Payer: Self-pay | Admitting: Cardiology

## 2014-08-14 NOTE — Telephone Encounter (Signed)
Received request from Nurse fax box, documents faxed for surgical clearance. To: [employer] Fax number: [phone]  Attention: [phone]/km

## 2014-08-22 ENCOUNTER — Other Ambulatory Visit: Payer: Self-pay

## 2014-08-23 ENCOUNTER — Ambulatory Visit (INDEPENDENT_AMBULATORY_CARE_PROVIDER_SITE_OTHER): Payer: 59 | Admitting: *Deleted

## 2014-08-23 DIAGNOSIS — I4891 Unspecified atrial fibrillation: Secondary | ICD-10-CM

## 2014-08-23 LAB — POCT INR: INR: 2.6

## 2014-09-06 ENCOUNTER — Other Ambulatory Visit: Payer: Self-pay

## 2014-09-07 ENCOUNTER — Other Ambulatory Visit: Payer: Self-pay | Admitting: Specialist

## 2014-09-07 ENCOUNTER — Inpatient Hospital Stay
Admission: RE | Admit: 2014-09-07 | Discharge: 2014-09-07 | Disposition: A | Discharge status: Home / Self Care | Payer: Self-pay | Source: Ambulatory Visit | Attending: Specialist | Admitting: Specialist

## 2014-09-07 DIAGNOSIS — M549 Dorsalgia, unspecified: Secondary | ICD-10-CM

## 2014-09-11 ENCOUNTER — Other Ambulatory Visit: Payer: Self-pay

## 2014-09-11 ENCOUNTER — Ambulatory Visit (INDEPENDENT_AMBULATORY_CARE_PROVIDER_SITE_OTHER): Payer: 59 | Admitting: *Deleted

## 2014-09-11 ENCOUNTER — Ambulatory Visit
Admission: RE | Admit: 2014-09-11 | Discharge: 2014-09-11 | Disposition: A | Discharge status: Home / Self Care | Payer: Worker's Compensation | Source: Ambulatory Visit | Attending: Specialist | Admitting: Specialist

## 2014-09-11 DIAGNOSIS — M5126 Other intervertebral disc displacement, lumbar region: Secondary | ICD-10-CM

## 2014-09-11 DIAGNOSIS — I4891 Unspecified atrial fibrillation: Secondary | ICD-10-CM

## 2014-09-11 LAB — POCT INR: INR: 1.2

## 2014-09-11 MED ORDER — LOSARTAN POTASSIUM 100 MG PO TABS: 100.0000 mg | ORAL_TABLET | Freq: Every evening | ORAL | Status: DC

## 2014-09-11 MED ORDER — IOHEXOL 180 MG/ML  SOLN
15.0000 mL | Freq: Once | INTRAMUSCULAR | Status: AC | PRN
  Administered 2014-09-11: 15 mL via INTRATHECAL

## 2014-09-11 MED ORDER — DIAZEPAM 5 MG PO TABS
10.0000 mg | ORAL_TABLET | Freq: Once | ORAL | Status: AC
  Administered 2014-09-11: 10 mg via ORAL

## 2014-09-11 MED ORDER — ONDANSETRON HCL 4 MG/2ML IJ SOLN
4.0000 mg | Freq: Once | INTRAMUSCULAR | Status: AC
  Administered 2014-09-11: 4 mg via INTRAMUSCULAR

## 2014-09-11 MED ORDER — MEPERIDINE HCL 100 MG/ML IJ SOLN
100.0000 mg | Freq: Once | INTRAMUSCULAR | Status: AC
  Administered 2014-09-11: 100 mg via INTRAMUSCULAR

## 2014-09-11 NOTE — Discharge Instructions (Addendum)
Myelogram Discharge Instructions  1. Go home and rest quietly for the next 24 hours.  It is important to lie flat for the next 24 hours.  Get up only to go to the restroom.  You may lie in the bed or on a couch on your back, your stomach, your left side or your right side.  You may have one pillow under your head.  You may have pillows between your knees while you are on your side or under your knees while you are on your back.  2. DO NOT drive today.  Recline the seat as far back as it will go, while still wearing your seat belt, on the way home.  3. You may get up to go to the bathroom as needed.  You may sit up for 10 minutes to eat.  You may resume your normal diet and medications unless otherwise indicated.  Drink plenty of extra fluids today and tomorrow.  4. The incidence of headache, nausea, or vomiting is about 5% (one in 20 patients).  If you develop a headache, lie flat and drink plenty of fluids until the headache goes away.  Caffeinated beverages may be helpful.  If you develop severe nausea and vomiting or a headache that does not go away with flat bed rest, call [phone].  5. You may resume normal activities after your 24 hours of bed rest is over; however, do not exert yourself strongly or do any heavy lifting tomorrow. If when you get up you have a headache when standing, go back to bed and force fluids for another 24 hours.  6. Call your physician for a follow-up appointment.  The results of your myelogram will be sent directly to your physician by the following day.  7. If you have any questions or if complications develop after you arrive home, please call [phone].    You may resume Coumadin/Warfarin today.

## 2014-09-18 NOTE — Progress Notes (Signed)
Cardiology Office Note   Date:  09/19/2014   ID:  Alec Hunt, DOB 04/06/1951, MRN [id number]  PCP:  Gavin Pound, MD  Cardiologist:   Sueanne Margarita, MD   Chief Complaint  Patient presents with  . Sleep Apnea  . Atrial Fibrillation  . Obesity      History of Present Illness: Alec Hunt is a 64 y.o. male with a history of PAF, OSA on BiPAP, HTN and morbid obesity who presents today for followup. He is doing well. He denies any chest pain, SOB, DOE, LE edema, palpitations, dizziness or syncope. He tolerates his BiPAP and feels the pressure is adequate. He feels rested in the am and has no daytime sleepiness. He tolerates the full face mask without any problems.  He has no nasal or mouth dryness.  He occasionally has some sinus congestion but that is from allergies.    Past Medical History  Diagnosis Date  . Atrial fibrillation   . Sleep apnea     cpap  . Arthritis     kness & shoulders  . PAF (paroxysmal atrial fibrillation)   . GERD (gastroesophageal reflux disease)   . Obesity   . Syncope     history of in the setting of afib  . OSA (obstructive sleep apnea)     Severe w AHI 81.28/hr now on Bipap  . Hypertension     Past Surgical History  Procedure Laterality Date  . Knee arthroscopic    . Colonoscopy       Current Outpatient Prescriptions  Medication Sig Dispense Refill  . diltiazem (CARDIZEM CD) 180 MG 24 hr capsule TAKE ONE CAPSULE EVERY DAY 90 capsule 0  . diltiazem (DILACOR XR) 180 MG 24 hr capsule Take 180 mg by mouth daily.    Marland Kitchen losartan (COZAAR) 100 MG tablet Take 1 tablet (100 mg total) by mouth every evening. 30 tablet 0  . methocarbamol (ROBAXIN) 500 MG tablet Take 500 mg by mouth every 8 (eight) hours as needed.  1  . nebivolol (BYSTOLIC) 5 MG tablet Take 0.5 tablets (2.5 mg total) by mouth every evening. 45 tablet 0  . oxycodone (OXY-IR) 5 MG capsule Take by mouth.    . traMADol (ULTRAM) 50 MG tablet Take 50 mg by mouth every 6 (six)  hours as needed. for pain  0  . warfarin (COUMADIN) 5 MG tablet TAKE 1 & 1/2 TABS ON ALL DAYS EXCEPT 2 TABS ON MONDAY, WED. & FRI. OR AS DIRECTED BY CLINIC 60 tablet 3   No current facility-administered medications for this visit.    Allergies:   Review of patient's allergies indicates no known allergies.    Social History:  The patient  reports that he quit smoking about 13 years ago. He has never used smokeless tobacco. He reports that he does not drink alcohol or use illicit drugs.   Family History:  The patient's family history includes CAD in his mother; Heart attack in his mother; Heart disease in his mother; Hypertension in his brother.    ROS:  Please see the history of present illness.   Otherwise, review of systems are positive for none.   All other systems are reviewed and negative.    PHYSICAL EXAM: VS:  BP 122/80 mmHg  Pulse 52  Ht 6\' 1"  (1.854 m)  Wt 346 lb 12.8 oz (157.307 kg)  BMI 45.76 kg/m2  SpO2 98% , BMI Body mass index is 45.76 kg/(m^2). GEN: Well nourished, well developed,  in no acute distress HEENT: normal Neck: no JVD, carotid bruits, or masses Cardiac: RRR; no murmurs, rubs, or gallops,no edema  Respiratory:  clear to auscultation bilaterally, normal work of breathing GI: soft, nontender, nondistended, + BS MS: no deformity or atrophy Skin: warm and dry, no rash Neuro:  Strength and sensation are intact Psych: euthymic mood, full affect   EKG:  EKG is not ordered today.    Recent Labs: 03/22/2014: BUN 11; Creatinine 1.0; Potassium 3.8; Sodium 136    Lipid Panel No results found for: CHOL, TRIG, HDL, CHOLHDL, VLDL, LDLCALC, LDLDIRECT    Wt Readings from Last 3 Encounters:  09/19/14 346 lb 12.8 oz (157.307 kg)  03/22/14 342 lb (155.13 kg)  09/27/13 333 lb (151.048 kg)    ASSESSMENT AND PLAN:  1. OSA on BiPAP and tolerating well.  I will get a download from his DME. 2. HTN well controlled - continue Diltiazem/losartan/Bystolic - check BMET  today  3. PAF maintaining NSR - continue Diltiazem/warfarin  4. Obesity 5. Chronic systemic anticoagulation  Current medicines are reviewed at length with the patient today.  The patient does not have concerns regarding medicines.  The following changes have been made:  no change  Labs/ tests ordered today include: BMET  No orders of the defined types were placed in this encounter.     Disposition:   FU with me in 6 months   Signed, Sueanne Margarita, MD  09/19/2014 8:21 AM    [facility] [address], [place], [place]  [address] Phone: [phone]; Fax: [phone]

## 2014-09-19 ENCOUNTER — Ambulatory Visit (INDEPENDENT_AMBULATORY_CARE_PROVIDER_SITE_OTHER): Payer: 59 | Admitting: Cardiology

## 2014-09-19 ENCOUNTER — Encounter: Payer: Self-pay | Admitting: Cardiology

## 2014-09-19 ENCOUNTER — Ambulatory Visit (INDEPENDENT_AMBULATORY_CARE_PROVIDER_SITE_OTHER): Payer: 59

## 2014-09-19 VITALS — BP 122/80 | HR 52 | Ht 73.0 in | Wt 346.8 lb

## 2014-09-19 DIAGNOSIS — I480 Paroxysmal atrial fibrillation: Secondary | ICD-10-CM

## 2014-09-19 DIAGNOSIS — I10 Essential (primary) hypertension: Secondary | ICD-10-CM

## 2014-09-19 DIAGNOSIS — I4891 Unspecified atrial fibrillation: Secondary | ICD-10-CM

## 2014-09-19 DIAGNOSIS — G4733 Obstructive sleep apnea (adult) (pediatric): Secondary | ICD-10-CM

## 2014-09-19 LAB — BASIC METABOLIC PANEL
BUN: 14 mg/dL (ref 6–23)
CO2: 27 meq/L (ref 19–32)
Calcium: 9.3 mg/dL (ref 8.4–10.5)
Chloride: 106 mEq/L (ref 96–112)
Creatinine, Ser: 0.94 mg/dL (ref 0.40–1.50)
GFR: 103.7 mL/min (ref >60.00)
Glucose, Bld: 118 mg/dL — ABNORMAL HIGH (ref 70–99)
POTASSIUM: 3.9 meq/L (ref 3.5–5.1)
SODIUM: 136 meq/L (ref 135–145)

## 2014-09-19 LAB — POCT INR: INR: 1.8

## 2014-09-19 NOTE — Patient Instructions (Signed)
Your physician recommends that you have lab work TODAY (BMET).  Your physician wants you to follow-up in: 6 months with Dr. Radford Pax. You will receive a reminder letter in the mail two months in advance. If you don't receive a letter, please call our office to schedule the follow-up appointment.

## 2014-09-20 ENCOUNTER — Ambulatory Visit: Payer: Self-pay | Admitting: Orthopedic Surgery

## 2014-09-20 NOTE — H&P (Signed)
Alec Hunt is an 64 y.o. male.   Chief Complaint: back and leg pain HPI: The patient is a 64 year old male who presents today for follow up of their back. The patient is being followed for their back pain. They are now 1 year(s) out from injury (DOI 09/14/13). Symptoms reported today include: pain. The patient states that they are doing 50 percent better. Current treatment includes: home exercise program (stretches). The following medication has been used for pain control: Ultram and Robacin. The patient reports their current pain level to be mild. The patient presents today following CT/Myelogram. Note for "Follow-up back": The patient was placed on a 10lb. lifting restriction and sedentary work only. NCM Anissa Carroll filling in for Shirley Azerba[place].  Alec Hunt follows up with a CT myelogram. He is out of work. He is here with Alec Hunt, his case Freight forwarder. His CT myelogram indicated he has severe stenosis at L4-5 and at L3-4. This is multi-factorial. He does have a disk herniation at L4-5 with an underlying central canal stenosis. There is bilateral L4 and L5 nerve root impingement. He reports it is better when he sits. He has problems with ambulation. Last week he had a flare when he stood up that produced 9-10 on the pain scale.  REVIEW OF SYSTEMS: Review of systems is negative for fevers, chest pain, shortness of breath, unexplained recent weight loss, loss of bowel or bladder function, burning with urination, joint swelling, rashes, weakness or numbness, difficulty with balance, easy bruising, excessive thirst or frequent urination.  He is currently on Coumadin for atrial fibrillation. He reports not having an atrial fibrillation episode for years.  Past Medical History  Diagnosis Date  . Atrial fibrillation   . Arthritis     kness & shoulders  . PAF (paroxysmal atrial fibrillation)   . GERD (gastroesophageal reflux disease)   . Obesity   . Syncope     history of in the setting of  afib  . OSA (obstructive sleep apnea)     Severe w AHI 81.28/hr now on Bipap  . Hypertension     Past Surgical History  Procedure Laterality Date  . Knee arthroscopic    . Colonoscopy      Family History  Problem Relation Age of Onset  . CAD Mother   . Heart attack Mother   . Heart disease Mother   . Hypertension Brother    Social History:  reports that he quit smoking about 13 years ago. He has never used smokeless tobacco. He reports that he does not drink alcohol or use illicit drugs.  Allergies: No Known Allergies   (Not in a hospital admission)  Results for orders placed or performed in visit on 09/19/14 (from the past 48 hour(s))  Basic Metabolic Panel (BMET)     Status: Abnormal   Collection Time: 09/19/14  8:35 AM  Result Value Ref Range   Sodium 136 135 - 145 mEq/L   Potassium 3.9 3.5 - 5.1 mEq/L   Chloride 106 96 - 112 mEq/L   CO2 27 19 - 32 mEq/L   Glucose, Bld 118 (H) 70 - 99 mg/dL   BUN 14 6 - 23 mg/dL   Creatinine, Ser 0.94 0.40 - 1.50 mg/dL   Calcium 9.3 8.4 - 10.5 mg/dL   GFR 103.70 >60.00 mL/min   No results found.  Review of Systems  Constitutional: Negative.   HENT: Negative.   Eyes: Negative.   Respiratory: Negative.   Cardiovascular: Negative.  Gastrointestinal: Negative.   Genitourinary: Negative.   Musculoskeletal: Positive for back pain.  Skin: Negative.   Neurological: Positive for focal weakness.  Psychiatric/Behavioral: Negative.     There were no vitals taken for this visit. Physical Exam  Constitutional: He is oriented to person, place, and time. He appears well-developed and well-nourished.  HENT:  Head: Normocephalic and atraumatic.  Eyes: Conjunctivae and EOM are normal. Pupils are equal, round, and reactive to light.  Neck: Normal range of motion. Neck supple.  Cardiovascular: Normal rate and regular rhythm.   Respiratory: Effort normal and breath sounds normal.  GI: Soft. Bowel sounds are normal.  Musculoskeletal:   He is upright and in mild distress. Mood and affect appropriate. He walks with a forward flexed antalgic gait. He has bilateral quad weakness noted.  Lumbar spine exam reveals no evidence of soft tissue swelling, ecchymosis or deformity. The abdomen is soft and nontender. Nontender over the trochanters. No cellulitis or lymphadenopathy.  Limited extension and end flexion secondary to back and leg pain. Straight leg raise is negative. Motor is 5/5 including EHL, tibialis anterior, plantar flexion, quadriceps and hamstrings. Patient is normoreflexia. There is no Babinski or clonus. Sensory exam is intact to light touch. The patient has good distal pulses. No DVT. No pain and normal range of motion without instability of the hips, knees and ankles.  Inspection of the cervical spine reveals a normal lordosis without evidence of paraspinous spasms or soft tissue swelling. Nontender to palpation. Full flexion, full extension, full left and right lateral rotation. Extension combined with lateral flexion does not reproduce pain. Negative impingement sign, negative secondary impingement sign of the shoulders. Negative Tinel's at median and ulnar nerves at the elbow. Negative carpal compression test at the wrists. Motor of the upper extremities is 5/5 including biceps, triceps, brachioradialis,wrist flexion, wrist extension, finger flexion, finger extension. Reflexes are normoreflexic. Sensory exam is intact to light touch. There is no Hoffmann sign. Nontender over the thoracic spine.  Neurological: He is alert and oriented to person, place, and time. He has normal reflexes.  Skin: Skin is warm and dry.  Psychiatric: He has a normal mood and affect.    CT myelogram reviewed. This demonstrates a severe stenosis at L3-4 and L4-5. There is no instability.  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 Aggravated with a work injury as well  as producing a disk herniation with underlying stenosis generating a severe spinal stenosis and neurogenic claudication.  We discussed options. We discussed living with the symptoms. He has only had temporary relief from his injection versus a lumbar decompression. This involved two levels.  I had an extensive discussion of the risks and benefits of the lumbar decompression with the patient including bleeding, infection, damage to neurovascular structures, epidural fibrosis, CSF leak requiring repair. We also discussed increase in pain, adjacent segment disease, recurrent disc herniation, need for future surgery including repeat decompression and/or fusion. We also discussed risks of postoperative hematoma, paralysis, anesthetic complications including DVT, PE, death, cardiopulmonary dysfunction. In addition, the perioperative and postoperative courses were discussed in detail including the rehabilitative time and return to functional activity and work. I provided the patient with an illustrated handout and utilized the appropriate surgical models.  He will be required to be off his Coumadin and post operatively for five days. An option would be to use an aspirin peri-operatively for decompression, this should be appropriate. He reports his symptoms are severe. He is  having a very difficult time maintaining his activity level and weight. He is 325.  In the interim, he will be sedentary, no lifting over 10 pounds. We discussed this separately and in front of the patient with Alec Hunt, his case manager regarding stenosis, his mechanism of injury, his current options. He will Hunt his cardiologist, Vonna Drafts, tomorrow. I recommend a preoperative anesthesia consult as well for management of his atrial fibrillation and discussion with Dr. Radford Pax concerning appropriate anti-coagulation. This is related to his initial injury. I spent considerable time discussing all of his issues.  Plan microlumbar  decompression L4-5, L3-4, possible L2-3  BISSELL, JACLYN M. PA-C for Dr. Tonita Cong 09/20/2014, 5:03 PM

## 2014-09-20 NOTE — Progress Notes (Signed)
Please put orders in Epic surgery 09-27-14 pre op 09-21-14 Thanks

## 2014-09-21 ENCOUNTER — Ambulatory Visit (HOSPITAL_COMMUNITY)
Admission: RE | Admit: 2014-09-21 | Discharge: 2014-09-21 | Disposition: A | Discharge status: Home / Self Care | Payer: Worker's Compensation | Source: Ambulatory Visit | Attending: Orthopedic Surgery | Admitting: Orthopedic Surgery

## 2014-09-21 ENCOUNTER — Ambulatory Visit: Payer: Self-pay | Admitting: Orthopedic Surgery

## 2014-09-21 ENCOUNTER — Encounter (HOSPITAL_COMMUNITY): Payer: Self-pay

## 2014-09-21 ENCOUNTER — Encounter (HOSPITAL_COMMUNITY)
Admission: RE | Admit: 2014-09-21 | Discharge: 2014-09-21 | Disposition: A | Discharge status: Home / Self Care | Payer: Worker's Compensation | Source: Ambulatory Visit | Attending: Specialist | Admitting: Specialist

## 2014-09-21 DIAGNOSIS — Z01810 Encounter for preprocedural cardiovascular examination: Secondary | ICD-10-CM | POA: Diagnosis not present

## 2014-09-21 DIAGNOSIS — M4806 Spinal stenosis, lumbar region: Secondary | ICD-10-CM | POA: Diagnosis not present

## 2014-09-21 DIAGNOSIS — Z01812 Encounter for preprocedural laboratory examination: Secondary | ICD-10-CM | POA: Insufficient documentation

## 2014-09-21 DIAGNOSIS — Z01818 Encounter for other preprocedural examination: Secondary | ICD-10-CM | POA: Insufficient documentation

## 2014-09-21 DIAGNOSIS — M48061 Spinal stenosis, lumbar region without neurogenic claudication: Secondary | ICD-10-CM

## 2014-09-21 LAB — SURGICAL PCR SCREEN
MRSA, PCR: NEGATIVE
Staphylococcus aureus: NEGATIVE

## 2014-09-21 LAB — PROTIME-INR
INR: 1.98 — AB (ref 0.00–1.49)
Prothrombin Time: 22.7 seconds — ABNORMAL HIGH (ref 11.6–15.2)

## 2014-09-21 LAB — CBC
HCT: 40.1 % (ref 39.0–52.0)
Hemoglobin: 13.2 g/dL (ref 13.0–17.0)
MCH: 29.3 pg (ref 26.0–34.0)
MCHC: 32.9 g/dL (ref 30.0–36.0)
MCV: 89.1 fL (ref 78.0–100.0)
Platelets: 231 10*3/uL (ref 150–400)
RBC: 4.5 MIL/uL (ref 4.22–5.81)
RDW: 12.7 % (ref 11.5–15.5)
WBC: 5.4 10*3/uL (ref 4.0–10.5)

## 2014-09-21 LAB — APTT: aPTT: 37 seconds (ref 24–37)

## 2014-09-21 LAB — ABO/RH: ABO/RH(D): O POS

## 2014-09-21 NOTE — Progress Notes (Addendum)
BMET in epic from 09/19/2014  ECHO epic 06/15/2012  OV note per Dr Radford Pax / Cardiology 09/19/2014  PT results in epic per PAT visit 09/21/2014 sent to Dr Tonita Cong

## 2014-09-21 NOTE — Patient Instructions (Signed)
[place]  09/21/2014   Your procedure is scheduled on:     Wednesday September 27, 2014   Report to [facility] Main Entrance and follow signs to  [place] arrive at 5:30  AM  Call this number if you have problems the morning of surgery [phone] or Presurgical Testing [phone].   Remember:  Do not eat food or drink liquids :After Midnight.  For Living Will and/or Health Care Power Attorney Forms: please provide copy for your medical record, may bring AM of surgery (forms should be already notarized-we do not provide this service).  For Cpap use: bring mask and tubing only.     Take these medicines the morning of surgery with A SIP OF WATER: NONE                               You may not have any metal on your body including hair pins and piercings  Do not wear jewelry, colognes,  lotions, powders, or deodorant.  Men may shave face and neck.               Do not bring valuables to the hospital. [place].  Contacts, dentures or bridgework may not be worn into surgery.  Leave suitcase in the car. After surgery it may be brought to your room.  For patients admitted to the hospital, checkout time is 11:00 AM the day of discharge.     Special Instructions: review fact sheets for MRSA information, Blood Transfusion fact sheet, Incentive Spirometry.  Remember: Type/Screen "Blue armsbands"- may not be removed once applied(would result in being retested AM of surgery, if removed). ________________________________________________________________________  [facility] - Preparing for Surgery Before surgery, you can play an important role.  Because skin is not sterile, your skin needs to be as free of germs as possible.  You can reduce the number of germs on your skin by washing with CHG (chlorahexidine gluconate) soap before surgery.  CHG is an antiseptic cleaner which kills germs and bonds with the skin to continue killing  germs even after washing. Please DO NOT use if you have an allergy to CHG or antibacterial soaps.  If your skin becomes reddened/irritated stop using the CHG and inform your nurse when you arrive at Short Stay. Do not shave (including legs and underarms) for at least 48 hours prior to the first CHG shower.  You may shave your face/neck. Please follow these instructions carefully:  1.  Shower with CHG Soap the night before surgery and the  morning of Surgery.  2.  If you choose to wash your hair, wash your hair first as usual with your  normal  shampoo.  3.  After you shampoo, rinse your hair and body thoroughly to remove the  shampoo.                           4.  Use CHG as you would any other liquid soap.  You can apply chg directly  to the skin and wash                       Gently with a scrungie or clean washcloth.  5.  Apply the CHG Soap to your body ONLY FROM THE NECK DOWN.   Do not use on face/ open  Wound or open sores. Avoid contact with eyes, ears mouth and genitals (private parts).                       Wash face,  Genitals (private parts) with your normal soap.             6.  Wash thoroughly, paying special attention to the area where your surgery  will be performed.  7.  Thoroughly rinse your body with warm water from the neck down.  8.  DO NOT shower/wash with your normal soap after using and rinsing off  the CHG Soap.                9.  Pat yourself dry with a clean towel.            10.  Wear clean pajamas.            11.  Place clean sheets on your bed the night of your first shower and do not  sleep with pets. Day of Surgery : Do not apply any lotions/deodorants the morning of surgery.  Please wear clean clothes to the hospital/surgery center.  FAILURE TO FOLLOW THESE INSTRUCTIONS MAY RESULT IN THE CANCELLATION OF YOUR SURGERY PATIENT SIGNATURE_________________________________  NURSE  SIGNATURE__________________________________  ________________________________________________________________________   Alec Hunt  An incentive spirometer is a tool that can help keep your lungs clear and active. This tool measures how well you are filling your lungs with each breath. Taking long deep breaths may help reverse or decrease the chance of developing breathing (pulmonary) problems (especially infection) following:  A long period of time when you are unable to move or be active. BEFORE THE PROCEDURE   If the spirometer includes an indicator to show your best effort, your nurse or respiratory therapist will set it to a desired goal.  If possible, sit up straight or lean slightly forward. Try not to slouch.  Hold the incentive spirometer in an upright position. INSTRUCTIONS FOR USE   Sit on the edge of your bed if possible, or sit up as far as you can in bed or on a chair.  Hold the incentive spirometer in an upright position.  Breathe out normally.  Place the mouthpiece in your mouth and seal your lips tightly around it.  Breathe in slowly and as deeply as possible, raising the piston or the ball toward the top of the column.  Hold your breath for 3-5 seconds or for as long as possible. Allow the piston or ball to fall to the bottom of the column.  Remove the mouthpiece from your mouth and breathe out normally.  Rest for a few seconds and repeat Steps 1 through 7 at least 10 times every 1-2 hours when you are awake. Take your time and take a few normal breaths between deep breaths.  The spirometer may include an indicator to show your best effort. Use the indicator as a goal to work toward during each repetition.  After each set of 10 deep breaths, practice coughing to be sure your lungs are clear. If you have an incision (the cut made at the time of surgery), support your incision when coughing by placing a pillow or rolled up towels firmly against it. Once  you are able to get out of bed, walk around indoors and cough well. You may stop using the incentive spirometer when instructed by your caregiver.  RISKS AND COMPLICATIONS  Take your time so you do not  get dizzy or light-headed.  If you are in pain, you may need to take or ask for pain medication before doing incentive spirometry. It is harder to take a deep breath if you are having pain. AFTER USE  Rest and breathe slowly and easily.  It can be helpful to keep track of a log of your progress. Your caregiver can provide you with a simple table to help with this. If you are using the spirometer at home, follow these instructions: [place] IF:   You are having difficultly using the spirometer.  You have trouble using the spirometer as often as instructed.  Your pain medication is not giving enough relief while using the spirometer.  You develop fever of 100.5 F (38.1 C) or higher. SEEK IMMEDIATE MEDICAL CARE IF:   You cough up bloody sputum that had not been present before.  You develop fever of 102 F (38.9 C) or greater.  You develop worsening pain at or near the incision site. MAKE SURE YOU:   Understand these instructions.  Will watch your condition.  Will get help right away if you are not doing well or get worse. Document Released: 10/27/2006 Document Revised: 09/08/2011 Document Reviewed: 12/28/2006 ExitCare Patient Information 2014 [employer], [place].   ________________________________________________________________________  WHAT IS A BLOOD TRANSFUSION? Blood Transfusion Information  A transfusion is the replacement of blood or some of its parts. Blood is made up of multiple cells which provide different functions.  Red blood cells carry oxygen and are used for blood loss replacement.  White blood cells fight against infection.  Platelets control bleeding.  Plasma helps clot blood.  Other blood products are available for specialized needs, such as  hemophilia or other clotting disorders. BEFORE THE TRANSFUSION  Who gives blood for transfusions?   Healthy volunteers who are fully evaluated to make sure their blood is safe. This is blood bank blood. Transfusion therapy is the safest it has ever been in the practice of medicine. Before blood is taken from a donor, a complete history is taken to make sure that person has no history of diseases nor engages in risky social behavior (examples are intravenous drug use or sexual activity with multiple partners). The donor's travel history is screened to minimize risk of transmitting infections, such as malaria. The donated blood is tested for signs of infectious diseases, such as HIV and hepatitis. The blood is then tested to be sure it is compatible with you in order to minimize the chance of a transfusion reaction. If you or a relative donates blood, this is often done in anticipation of surgery and is not appropriate for emergency situations. It takes many days to process the donated blood. RISKS AND COMPLICATIONS Although transfusion therapy is very safe and saves many lives, the main dangers of transfusion include:   Getting an infectious disease.  Developing a transfusion reaction. This is an allergic reaction to something in the blood you were given. Every precaution is taken to prevent this. The decision to have a blood transfusion has been considered carefully by your caregiver before blood is given. Blood is not given unless the benefits outweigh the risks. AFTER THE TRANSFUSION  Right after receiving a blood transfusion, you will usually feel much better and more energetic. This is especially true if your red blood cells have gotten low (anemic). The transfusion raises the level of the red blood cells which carry oxygen, and this usually causes an energy increase.  The nurse administering the transfusion will  monitor you carefully for complications. HOME CARE INSTRUCTIONS  No special  instructions are needed after a transfusion. You may find your energy is better. Speak with your caregiver about any limitations on activity for underlying diseases you may have. SEEK MEDICAL CARE IF:   Your condition is not improving after your transfusion.  You develop redness or irritation at the intravenous (IV) site. SEEK IMMEDIATE MEDICAL CARE IF:  Any of the following symptoms occur over the next 12 hours:  Shaking chills.  You have a temperature by mouth above 102 F (38.9 C), not controlled by medicine.  Chest, back, or muscle pain.  People around you feel you are not acting correctly or are confused.  Shortness of breath or difficulty breathing.  Dizziness and fainting.  You get a rash or develop hives.  You have a decrease in urine output.  Your urine turns a dark color or changes to pink, red, or brown. Any of the following symptoms occur over the next 10 days:  You have a temperature by mouth above 102 F (38.9 C), not controlled by medicine.  Shortness of breath.  Weakness after normal activity.  The white part of the eye turns yellow (jaundice).  You have a decrease in the amount of urine or are urinating less often.  Your urine turns a dark color or changes to pink, red, or brown. Document Released: 06/13/2000 Document Revised: 09/08/2011 Document Reviewed: 01/31/2008 [facility] Patient Information 2014 [place], [place].  _______________________________________________________________________

## 2014-09-26 ENCOUNTER — Ambulatory Visit: Payer: Self-pay | Admitting: Orthopedic Surgery

## 2014-09-27 ENCOUNTER — Ambulatory Visit (HOSPITAL_COMMUNITY)
Admission: RE | Admit: 2014-09-27 | Discharge: 2014-09-29 | Disposition: A | Discharge status: Home / Self Care | Payer: Worker's Compensation | Source: Ambulatory Visit | Attending: Specialist | Admitting: Specialist

## 2014-09-27 ENCOUNTER — Ambulatory Visit (HOSPITAL_COMMUNITY): Payer: Worker's Compensation

## 2014-09-27 ENCOUNTER — Ambulatory Visit (HOSPITAL_COMMUNITY): Payer: Worker's Compensation | Admitting: Anesthesiology

## 2014-09-27 ENCOUNTER — Encounter (HOSPITAL_COMMUNITY)
Admission: RE | Disposition: A | Discharge status: Home / Self Care | Payer: Self-pay | Source: Ambulatory Visit | Attending: Specialist

## 2014-09-27 ENCOUNTER — Encounter (HOSPITAL_COMMUNITY): Payer: Self-pay | Admitting: *Deleted

## 2014-09-27 DIAGNOSIS — Z6841 Body Mass Index (BMI) 40.0 and over, adult: Secondary | ICD-10-CM | POA: Insufficient documentation

## 2014-09-27 DIAGNOSIS — M5126 Other intervertebral disc displacement, lumbar region: Secondary | ICD-10-CM | POA: Insufficient documentation

## 2014-09-27 DIAGNOSIS — I10 Essential (primary) hypertension: Secondary | ICD-10-CM | POA: Diagnosis not present

## 2014-09-27 DIAGNOSIS — M4806 Spinal stenosis, lumbar region: Secondary | ICD-10-CM | POA: Diagnosis not present

## 2014-09-27 DIAGNOSIS — Z419 Encounter for procedure for purposes other than remedying health state, unspecified: Secondary | ICD-10-CM

## 2014-09-27 DIAGNOSIS — Z7901 Long term (current) use of anticoagulants: Secondary | ICD-10-CM | POA: Diagnosis not present

## 2014-09-27 DIAGNOSIS — G4733 Obstructive sleep apnea (adult) (pediatric): Secondary | ICD-10-CM | POA: Insufficient documentation

## 2014-09-27 DIAGNOSIS — Z87891 Personal history of nicotine dependence: Secondary | ICD-10-CM | POA: Insufficient documentation

## 2014-09-27 DIAGNOSIS — M1389 Other specified arthritis, multiple sites: Secondary | ICD-10-CM | POA: Insufficient documentation

## 2014-09-27 DIAGNOSIS — I480 Paroxysmal atrial fibrillation: Secondary | ICD-10-CM | POA: Insufficient documentation

## 2014-09-27 DIAGNOSIS — I4819 Other persistent atrial fibrillation: Secondary | ICD-10-CM | POA: Diagnosis present

## 2014-09-27 DIAGNOSIS — M48061 Spinal stenosis, lumbar region without neurogenic claudication: Secondary | ICD-10-CM | POA: Diagnosis present

## 2014-09-27 HISTORY — PX: LUMBAR LAMINECTOMY/DECOMPRESSION MICRODISCECTOMY: SHX5026

## 2014-09-27 LAB — PROTIME-INR
INR: 1.12 (ref 0.00–1.49)
Prothrombin Time: 14.5 seconds (ref 11.6–15.2)

## 2014-09-27 LAB — TYPE AND SCREEN
ABO/RH(D): O POS
ANTIBODY SCREEN: NEGATIVE

## 2014-09-27 SURGERY — LUMBAR LAMINECTOMY/DECOMPRESSION MICRODISCECTOMY 3 LEVELS
Anesthesia: General

## 2014-09-27 MED ORDER — NEOSTIGMINE METHYLSULFATE 10 MG/10ML IV SOLN
INTRAVENOUS | Status: DC | PRN
  Administered 2014-09-27: 6 mg via INTRAVENOUS

## 2014-09-27 MED ORDER — MIDAZOLAM HCL 5 MG/5ML IJ SOLN
INTRAMUSCULAR | Status: DC | PRN
  Administered 2014-09-27: 2 mg via INTRAVENOUS

## 2014-09-27 MED ORDER — MIDAZOLAM HCL 2 MG/2ML IJ SOLN
INTRAMUSCULAR | Status: AC
  Filled 2014-09-27: qty 2

## 2014-09-27 MED ORDER — PROPOFOL 10 MG/ML IV BOLUS
INTRAVENOUS | Status: DC | PRN
  Administered 2014-09-27: 200 mg via INTRAVENOUS

## 2014-09-27 MED ORDER — OXYCODONE-ACETAMINOPHEN 5-325 MG PO TABS: 1.0000 | ORAL_TABLET | ORAL | Status: DC | PRN

## 2014-09-27 MED ORDER — ONDANSETRON HCL 4 MG/2ML IJ SOLN
INTRAMUSCULAR | Status: DC | PRN
  Administered 2014-09-27: 4 mg via INTRAVENOUS

## 2014-09-27 MED ORDER — BUPIVACAINE-EPINEPHRINE (PF) 0.5% -1:200000 IJ SOLN
INTRAMUSCULAR | Status: AC
  Filled 2014-09-27: qty 30

## 2014-09-27 MED ORDER — METOCLOPRAMIDE HCL 5 MG/ML IJ SOLN
INTRAMUSCULAR | Status: DC | PRN
  Administered 2014-09-27: 10 mg via INTRAVENOUS

## 2014-09-27 MED ORDER — METHOCARBAMOL 1000 MG/10ML IJ SOLN
500.0000 mg | Freq: Four times a day (QID) | INTRAVENOUS | Status: DC | PRN
  Administered 2014-09-27: 500 mg via INTRAVENOUS
  Filled 2014-09-27 (×2): qty 5

## 2014-09-27 MED ORDER — OXYCODONE HCL 5 MG/5ML PO SOLN
5.0000 mg | Freq: Once | ORAL | Status: DC | PRN
  Filled 2014-09-27: qty 5

## 2014-09-27 MED ORDER — PHENYLEPHRINE 40 MCG/ML (10ML) SYRINGE FOR IV PUSH (FOR BLOOD PRESSURE SUPPORT)
PREFILLED_SYRINGE | INTRAVENOUS | Status: AC
  Filled 2014-09-27: qty 10

## 2014-09-27 MED ORDER — GLYCOPYRROLATE 0.2 MG/ML IJ SOLN
INTRAMUSCULAR | Status: DC | PRN
  Administered 2014-09-27: .8 mg via INTRAVENOUS

## 2014-09-27 MED ORDER — ONDANSETRON HCL 4 MG/2ML IJ SOLN
INTRAMUSCULAR | Status: AC
  Filled 2014-09-27: qty 2

## 2014-09-27 MED ORDER — ONDANSETRON HCL 4 MG/2ML IJ SOLN: 4.0000 mg | INTRAMUSCULAR | Status: DC | PRN

## 2014-09-27 MED ORDER — SODIUM CHLORIDE 0.9 % IR SOLN
Status: AC
  Filled 2014-09-27: qty 1

## 2014-09-27 MED ORDER — KCL IN DEXTROSE-NACL 20-5-0.45 MEQ/L-%-% IV SOLN
INTRAVENOUS | Status: AC
  Administered 2014-09-27: 75 mL/h via INTRAVENOUS
  Administered 2014-09-28: 04:00:00 via INTRAVENOUS
  Filled 2014-09-27 (×2): qty 1000

## 2014-09-27 MED ORDER — DILTIAZEM HCL ER COATED BEADS 180 MG PO CP24
180.0000 mg | ORAL_CAPSULE | Freq: Every day | ORAL | Status: DC
  Administered 2014-09-27 – 2014-09-29 (×3): 180 mg via ORAL
  Filled 2014-09-27 (×3): qty 1

## 2014-09-27 MED ORDER — NEBIVOLOL HCL 2.5 MG PO TABS
2.5000 mg | ORAL_TABLET | Freq: Every evening | ORAL | Status: DC
  Administered 2014-09-27 – 2014-09-28 (×2): 2.5 mg via ORAL
  Filled 2014-09-27 (×3): qty 1

## 2014-09-27 MED ORDER — ACETAMINOPHEN 325 MG PO TABS: 650.0000 mg | ORAL_TABLET | ORAL | Status: DC | PRN

## 2014-09-27 MED ORDER — THROMBIN 5000 UNITS EX SOLR
CUTANEOUS | Status: DC | PRN
  Administered 2014-09-27: 10000 [IU] via TOPICAL

## 2014-09-27 MED ORDER — PROPOFOL 10 MG/ML IV BOLUS
INTRAVENOUS | Status: AC
Start: 2014-09-27 — End: 2014-09-27
  Filled 2014-09-27: qty 20

## 2014-09-27 MED ORDER — ALUM & MAG HYDROXIDE-SIMETH 200-200-20 MG/5ML PO SUSP: 30.0000 mL | Freq: Four times a day (QID) | ORAL | Status: DC | PRN

## 2014-09-27 MED ORDER — DILTIAZEM HCL ER COATED BEADS 180 MG PO CP24
180.0000 mg | ORAL_CAPSULE | Freq: Every day | ORAL | Status: DC
  Filled 2014-09-27: qty 1

## 2014-09-27 MED ORDER — SODIUM CHLORIDE 0.9 % IR SOLN
Status: DC | PRN
  Administered 2014-09-27: 1000 mL

## 2014-09-27 MED ORDER — METOCLOPRAMIDE HCL 5 MG/ML IJ SOLN
INTRAMUSCULAR | Status: AC
  Filled 2014-09-27: qty 2

## 2014-09-27 MED ORDER — BISACODYL 5 MG PO TBEC: 5.0000 mg | DELAYED_RELEASE_TABLET | Freq: Every day | ORAL | Status: DC | PRN

## 2014-09-27 MED ORDER — DEXAMETHASONE SODIUM PHOSPHATE 10 MG/ML IJ SOLN
INTRAMUSCULAR | Status: DC | PRN
  Administered 2014-09-27: 10 mg via INTRAVENOUS

## 2014-09-27 MED ORDER — FENTANYL CITRATE 0.05 MG/ML IJ SOLN
INTRAMUSCULAR | Status: DC | PRN
  Administered 2014-09-27: 50 ug via INTRAVENOUS
  Administered 2014-09-27: 100 ug via INTRAVENOUS
  Administered 2014-09-27: 50 ug via INTRAVENOUS

## 2014-09-27 MED ORDER — SUCCINYLCHOLINE CHLORIDE 20 MG/ML IJ SOLN
INTRAMUSCULAR | Status: DC | PRN
  Administered 2014-09-27: 100 mg via INTRAVENOUS

## 2014-09-27 MED ORDER — METHOCARBAMOL 500 MG PO TABS
500.0000 mg | ORAL_TABLET | Freq: Four times a day (QID) | ORAL | Status: DC | PRN
  Administered 2014-09-27 – 2014-09-29 (×4): 500 mg via ORAL
  Filled 2014-09-27 (×4): qty 1

## 2014-09-27 MED ORDER — DOCUSATE SODIUM 100 MG PO CAPS: 100.0000 mg | ORAL_CAPSULE | Freq: Two times a day (BID) | ORAL | Status: DC | PRN

## 2014-09-27 MED ORDER — HYDROMORPHONE HCL 1 MG/ML IJ SOLN
INTRAMUSCULAR | Status: AC
Start: 2014-09-27 — End: 2014-09-27
  Filled 2014-09-27: qty 1

## 2014-09-27 MED ORDER — DEXAMETHASONE SODIUM PHOSPHATE 10 MG/ML IJ SOLN
INTRAMUSCULAR | Status: AC
  Filled 2014-09-27: qty 1

## 2014-09-27 MED ORDER — DOCUSATE SODIUM 100 MG PO CAPS
100.0000 mg | ORAL_CAPSULE | Freq: Two times a day (BID) | ORAL | Status: DC
  Administered 2014-09-27 – 2014-09-29 (×4): 100 mg via ORAL

## 2014-09-27 MED ORDER — ACETAMINOPHEN 650 MG RE SUPP: 650.0000 mg | RECTAL | Status: DC | PRN

## 2014-09-27 MED ORDER — CISATRACURIUM BESYLATE 20 MG/10ML IV SOLN
INTRAVENOUS | Status: AC
  Filled 2014-09-27: qty 10

## 2014-09-27 MED ORDER — SENNOSIDES-DOCUSATE SODIUM 8.6-50 MG PO TABS: 1.0000 | ORAL_TABLET | Freq: Every evening | ORAL | Status: DC | PRN

## 2014-09-27 MED ORDER — OXYCODONE HCL 5 MG PO TABS: 5.0000 mg | ORAL_TABLET | Freq: Once | ORAL | Status: DC | PRN

## 2014-09-27 MED ORDER — LACTATED RINGERS IV SOLN
INTRAVENOUS | Status: DC
  Administered 2014-09-27 (×3): via INTRAVENOUS

## 2014-09-27 MED ORDER — HYDROMORPHONE HCL 1 MG/ML IJ SOLN
0.2500 mg | INTRAMUSCULAR | Status: DC | PRN
  Administered 2014-09-27 (×4): 0.5 mg via INTRAVENOUS

## 2014-09-27 MED ORDER — MENTHOL 3 MG MT LOZG: 1.0000 | LOZENGE | OROMUCOSAL | Status: DC | PRN

## 2014-09-27 MED ORDER — HYDROMORPHONE HCL 1 MG/ML IJ SOLN: 0.5000 mg | INTRAMUSCULAR | Status: DC | PRN

## 2014-09-27 MED ORDER — PHENOL 1.4 % MT LIQD: 1.0000 | OROMUCOSAL | Status: DC | PRN

## 2014-09-27 MED ORDER — MAGNESIUM CITRATE PO SOLN: 1.0000 | Freq: Once | ORAL | Status: AC | PRN

## 2014-09-27 MED ORDER — DEXTROSE 5 % IV SOLN
3.0000 g | INTRAVENOUS | Status: AC
  Administered 2014-09-27: 3 g via INTRAVENOUS
  Filled 2014-09-27: qty 3000

## 2014-09-27 MED ORDER — THROMBIN 5000 UNITS EX SOLR
CUTANEOUS | Status: AC
  Filled 2014-09-27: qty 10000

## 2014-09-27 MED ORDER — CISATRACURIUM BESYLATE (PF) 10 MG/5ML IV SOLN
INTRAVENOUS | Status: DC | PRN
  Administered 2014-09-27: 4 mg via INTRAVENOUS
  Administered 2014-09-27: 10 mg via INTRAVENOUS
  Administered 2014-09-27 (×2): 2 mg via INTRAVENOUS

## 2014-09-27 MED ORDER — GLYCOPYRROLATE 0.2 MG/ML IJ SOLN
INTRAMUSCULAR | Status: AC
  Filled 2014-09-27: qty 4

## 2014-09-27 MED ORDER — CEFAZOLIN SODIUM-DEXTROSE 2-3 GM-% IV SOLR
2.0000 g | Freq: Three times a day (TID) | INTRAVENOUS | Status: AC
  Administered 2014-09-27 – 2014-09-28 (×3): 2 g via INTRAVENOUS
  Filled 2014-09-27 (×3): qty 50

## 2014-09-27 MED ORDER — HYDROMORPHONE HCL 1 MG/ML IJ SOLN
INTRAMUSCULAR | Status: AC
  Filled 2014-09-27: qty 1

## 2014-09-27 MED ORDER — OXYCODONE-ACETAMINOPHEN 5-325 MG PO TABS
1.0000 | ORAL_TABLET | ORAL | Status: DC | PRN
  Administered 2014-09-27 – 2014-09-29 (×7): 2 via ORAL
  Filled 2014-09-27 (×7): qty 2

## 2014-09-27 MED ORDER — TRAMADOL HCL 50 MG PO TABS: 50.0000 mg | ORAL_TABLET | Freq: Four times a day (QID) | ORAL | Status: DC | PRN

## 2014-09-27 MED ORDER — METHOCARBAMOL 500 MG PO TABS: 500.0000 mg | ORAL_TABLET | Freq: Three times a day (TID) | ORAL | Status: DC | PRN

## 2014-09-27 MED ORDER — PROMETHAZINE HCL 25 MG/ML IJ SOLN: 6.2500 mg | INTRAMUSCULAR | Status: DC | PRN

## 2014-09-27 MED ORDER — FENTANYL CITRATE 0.05 MG/ML IJ SOLN
INTRAMUSCULAR | Status: AC
  Filled 2014-09-27: qty 5

## 2014-09-27 MED ORDER — HYDROCODONE-ACETAMINOPHEN 5-325 MG PO TABS: 1.0000 | ORAL_TABLET | ORAL | Status: DC | PRN

## 2014-09-27 MED ORDER — NEOSTIGMINE METHYLSULFATE 10 MG/10ML IV SOLN
INTRAVENOUS | Status: AC
  Filled 2014-09-27: qty 1

## 2014-09-27 SURGICAL SUPPLY — 47 items
BAG ZIPLOCK 12X15 (MISCELLANEOUS) IMPLANT
CLOSURE WOUND 1/2 X4 (GAUZE/BANDAGES/DRESSINGS)
CLOTH 2% CHLOROHEXIDINE 3PK (PERSONAL CARE ITEMS) ×3 IMPLANT
DRAPE MICROSCOPE LEICA (MISCELLANEOUS) ×3 IMPLANT
DRAPE POUCH INSTRU U-SHP 10X18 (DRAPES) ×3 IMPLANT
DRAPE SURG 17X11 SM STRL (DRAPES) ×3 IMPLANT
DRAPE UTILITY XL STRL (DRAPES) ×3 IMPLANT
DRSG ADAPTIC 3X8 NADH LF (GAUZE/BANDAGES/DRESSINGS) ×3 IMPLANT
DRSG AQUACEL AG ADV 3.5X 4 (GAUZE/BANDAGES/DRESSINGS) IMPLANT
DRSG AQUACEL AG ADV 3.5X 6 (GAUZE/BANDAGES/DRESSINGS) IMPLANT
DURAPREP 26ML APPLICATOR (WOUND CARE) ×3 IMPLANT
DURASEAL SPINE SEALANT 3ML (MISCELLANEOUS) IMPLANT
ELECT BLADE TIP CTD 4 INCH (ELECTRODE) ×3 IMPLANT
ELECT REM PT RETURN 9FT ADLT (ELECTROSURGICAL) ×3
ELECTRODE REM PT RTRN 9FT ADLT (ELECTROSURGICAL) ×1 IMPLANT
GAUZE SPONGE 4X4 12PLY STRL (GAUZE/BANDAGES/DRESSINGS) ×3 IMPLANT
GLOVE BIOGEL PI IND STRL 7.5 (GLOVE) ×1 IMPLANT
GLOVE BIOGEL PI INDICATOR 7.5 (GLOVE) ×2
GLOVE SURG SS PI 7.5 STRL IVOR (GLOVE) ×3 IMPLANT
GLOVE SURG SS PI 8.0 STRL IVOR (GLOVE) ×6 IMPLANT
GOWN STRL REUS W/TWL XL LVL3 (GOWN DISPOSABLE) ×6 IMPLANT
IV CATH 14GX2 1/4 (CATHETERS) IMPLANT
KIT BASIN OR (CUSTOM PROCEDURE TRAY) ×3 IMPLANT
KIT POSITIONING SURG ANDREWS (MISCELLANEOUS) ×3 IMPLANT
MANIFOLD NEPTUNE II (INSTRUMENTS) ×3 IMPLANT
NEEDLE SPNL 18GX3.5 QUINCKE PK (NEEDLE) ×9 IMPLANT
PACK LAMINECTOMY ORTHO (CUSTOM PROCEDURE TRAY) ×3 IMPLANT
PATTIES SURGICAL .5 X.5 (GAUZE/BANDAGES/DRESSINGS) IMPLANT
PATTIES SURGICAL .75X.75 (GAUZE/BANDAGES/DRESSINGS) ×3 IMPLANT
SPONGE LAP 4X18 X RAY DECT (DISPOSABLE) ×9 IMPLANT
SPONGE SURGIFOAM ABS GEL 100 (HEMOSTASIS) ×3 IMPLANT
STAPLER VISISTAT (STAPLE) IMPLANT
STRIP CLOSURE SKIN 1/2X4 (GAUZE/BANDAGES/DRESSINGS) IMPLANT
SUT NURALON 4 0 TR CR/8 (SUTURE) IMPLANT
SUT PROLENE 3 0 PS 2 (SUTURE) IMPLANT
SUT V-LOC BARB 180 2/0GR6 GS22 (SUTURE) ×3
SUT VIC AB 1 CT1 27 (SUTURE)
SUT VIC AB 1 CT1 27XBRD ANTBC (SUTURE) IMPLANT
SUT VIC AB 1-0 CT2 27 (SUTURE) IMPLANT
SUT VIC AB 2-0 CT1 27 (SUTURE) ×2
SUT VIC AB 2-0 CT1 TAPERPNT 27 (SUTURE) ×1 IMPLANT
SUT VIC AB 2-0 CT2 27 (SUTURE) IMPLANT
SUTURE V-LC BRB 180 2/0GR6GS22 (SUTURE) ×1 IMPLANT
SYR 3ML LL SCALE MARK (SYRINGE) IMPLANT
TAPE CLOTH 1X10 TAN NS (GAUZE/BANDAGES/DRESSINGS) ×3 IMPLANT
TOWEL OR 17X26 10 PK STRL BLUE (TOWEL DISPOSABLE) ×3 IMPLANT
YANKAUER SUCT BULB TIP NO VENT (SUCTIONS) ×3 IMPLANT

## 2014-09-27 NOTE — Anesthesia Procedure Notes (Signed)
Procedure Name: Intubation Date/Time: 09/27/2014 7:37 AM Performed by: Johnathan Hausen A Pre-anesthesia Checklist: Patient identified, Timeout performed, Emergency Drugs available, Suction available and Patient being monitored Patient Re-evaluated:Patient Re-evaluated prior to inductionOxygen Delivery Method: Circle system utilized Preoxygenation: Pre-oxygenation with 100% oxygen Intubation Type: Combination inhalational/ intravenous induction Ventilation: Mask ventilation without difficulty Laryngoscope Size: Mac and 5 Grade View: Grade I Tube type: Oral Tube size: 8.0 mm Number of attempts: 1 Airway Equipment and Method: Stylet Placement Confirmation: positive ETCO2,  ETT inserted through vocal cords under direct vision and breath sounds checked- equal and bilateral Secured at: 22 cm Tube secured with: Tape Dental Injury: Teeth and Oropharynx as per pre-operative assessment

## 2014-09-27 NOTE — Consult Note (Addendum)
[facility] Medical Consultation  BERTRAM HADDIX XIP:[id number] DOB: 07-21-1950 DOA: 09/27/2014 PCP: Gavin Pound, MD   Requesting physician: Dr Maxie Better.  Date of consultation: 09-27-2014 Reason for consultation: medical management, A fib, HTN.   Impression/Recommendations Principal Problem:   Spinal stenosis of lumbar region Active Problems:   Morbid obesity   OSA (obstructive sleep apnea)   Atrial fibrillation   Hypertension   Spinal stenosis at L4-L5 level    1. P. A fib: patient in sinus rhythm, rate controlled. Will continue with home dose Cardizem and bystolic. Holding coumadin due to recent surgery. Resume coumadin when ok by Dr Maxie Better.  2. Hypertension: resume Cardizem, bystolic. Hold lisinopril post op. If renal function remain stable might be able to resume cozaar in 24 to 48 hours.  3. Spinal Stenosis: post L 4-L 5 laminectomy; 3-30. management per Dr Maxie Better.  4. OSA: will order BIPAP HS.   I will followup again tomorrow. Please contact me if I can be of assistance in the meanwhile. Thank you for this consultation.  Chief Complaint: back pain.   HPI:  64 year old  With PMH significant for PAF, OSA, hypertension, chronic back pain and spinal stenosis who presents for elective L 4-L 5 Laminectomy on 3-30. Patient is S/P laminectomy. We were consulted to help with medical management. Patient is alert, answering questions. He denies chest pain, dyspnea. He relates back pain is ok. He denies nausea, vomiting or abdominal pain.   Review of Systems:  Negative, except as per HPI.   Past Medical History  Diagnosis Date  . Atrial fibrillation   . Arthritis     kness & shoulders  . PAF (paroxysmal atrial fibrillation)   . Obesity   . Syncope     history of in the setting of afib  . OSA (obstructive sleep apnea)     Severe w AHI 81.28/hr now on Bipap  . Hypertension   . Dysrhythmia    Past Surgical History  Procedure Laterality Date  . Knee arthroscopic    .  Colonoscopy    . Right rotator cuff surgery       2015  . Cardiac catheterization     Social History:  reports that he quit smoking about 14 years ago. His smoking use included Cigarettes. He has a 3.75 pack-year smoking history. He has never used smokeless tobacco. He reports that he drinks alcohol. He reports that he does not use illicit drugs.  No Known Allergies Family History  Problem Relation Age of Onset  . CAD Mother   . Heart attack Mother   . Heart disease Mother   . Hypertension Brother     Prior to Admission medications   Medication Sig Start Date End Date Taking? Authorizing Provider  acetaminophen (TYLENOL) 500 MG tablet Take 500 mg by mouth every 6 (six) hours as needed for moderate pain or headache.   Yes Historical Provider, MD  diltiazem (CARDIZEM CD) 180 MG 24 hr capsule TAKE ONE CAPSULE EVERY DAY Patient taking differently: Take one capsule at bedtime. 07/25/14  Yes Sueanne Margarita, MD  losartan (COZAAR) 100 MG tablet Take 1 tablet (100 mg total) by mouth every evening. Patient taking differently: Take 100 mg by mouth every morning.  09/11/14  Yes Sueanne Margarita, MD  methocarbamol (ROBAXIN) 500 MG tablet Take 500 mg by mouth every 8 (eight) hours as needed for muscle spasms.  08/15/14  Yes Historical Provider, MD  nebivolol (BYSTOLIC) 5 MG tablet Take 0.5 tablets (  2.5 mg total) by mouth every evening. 06/12/14  Yes Sueanne Margarita, MD  traMADol (ULTRAM) 50 MG tablet Take 50 mg by mouth every 6 (six) hours as needed for moderate pain.  08/15/14  Yes Historical Provider, MD  warfarin (COUMADIN) 5 MG tablet TAKE 1 & 1/2 TABS ON ALL DAYS EXCEPT 2 TABS ON MONDAY, [place]. OR AS DIRECTED BY CLINIC Patient taking differently: Take 1.5 tablet on sunday, tuesday, and thursday and two talbets on all other days. 07/24/14  Yes Sueanne Margarita, MD  docusate sodium (COLACE) 100 MG capsule Take 1 capsule (100 mg total) by mouth 2 (two) times daily as needed for mild constipation. 09/27/14    Susa Day, MD  methocarbamol (ROBAXIN) 500 MG tablet Take 1 tablet (500 mg total) by mouth every 8 (eight) hours as needed for muscle spasms. 09/27/14   Susa Day, MD  oxyCODONE-acetaminophen (PERCOCET) 5-325 MG per tablet Take 1-2 tablets by mouth every 4 (four) hours as needed. 09/27/14   Susa Day, MD   Physical Exam: Blood pressure 154/85, pulse 72, temperature 98 F (36.7 C), temperature source Oral, resp. rate 17, height 6\' 1"  (1.854 m), weight 158.305 kg (349 lb), SpO2 96 %. Filed Vitals:   09/27/14 1155  BP: 154/85  Pulse: 72  Temp: 98 F (36.7 C)  Resp: 17     General:  Alert in no distress,   Eyes: PERLA< EOM intact.   ENT: no tonsillar enlargement, no erythema.   Neck: supple, no thyromegaly.   Cardiovascular: S 1, S 2 RRR  Respiratory: CTA, no wheezing, no rubs.   Abdomen: BS presents, soft, nt  Skin: no rashes.   Musculoskeletal: normal motor tone.   Psychiatric: normal affect  Neurologic: Alert, oriented, answering question. Move extremities.   Labs on Admission:  Basic Metabolic Panel: No results for input(s): NA, K, CL, CO2, GLUCOSE, BUN, CREATININE, CALCIUM, MG, PHOS in the last 168 hours. Liver Function Tests: No results for input(s): AST, ALT, ALKPHOS, BILITOT, PROT, ALBUMIN in the last 168 hours. No results for input(s): LIPASE, AMYLASE in the last 168 hours. No results for input(s): AMMONIA in the last 168 hours. CBC:  Recent Labs Lab 09/21/14 0900  WBC 5.4  HGB 13.2  HCT 40.1  MCV 89.1  PLT 231   Cardiac Enzymes: No results for input(s): CKTOTAL, CKMB, CKMBINDEX, TROPONINI in the last 168 hours. BNP: Invalid input(s): POCBNP CBG: No results for input(s): GLUCAP in the last 168 hours.  Radiological Exams on Admission: Dg Spine Portable 1 View  09/27/2014   CLINICAL DATA:  Intraoperative film 3.  Spinal numbering.  EXAM: PORTABLE SPINE - 1 VIEW  COMPARISON:  Radiography from earlier today. Lumbar myelography 09/11/2014.   FINDINGS: Spinal numbering as on previous radiograph, with the spinous processes numbered as requested. Lower lumbar laminectomies with retractors and probes from the inferior L3 vertebra to the inferior L5 vertebra.  IMPRESSION: Intraoperative spinal numbering as above.   Electronically Signed   By: Monte Fantasia M.D.   On: 09/27/2014 09:35   Dg Spine Portable 1 View  09/27/2014   CLINICAL DATA:  Spine surgery.  EXAM: PORTABLE SPINE - 1 VIEW  COMPARISON:  09/17/2014.  FINDINGS: Lumbar spine is labeled with the lowest segmented appearing vertebra on lateral view as L5 . Metallic markers noted posteriorly at the L4-L5 disc space. Diffuse degenerative change with minimal anterolisthesis L4 on L5.  IMPRESSION: Metallic marker noted at the L4-L5 disc space level.   Electronically  Signed   By: Marcello Moores  Register   On: 09/27/2014 08:35   Dg Spine Portable 1 View  09/27/2014   CLINICAL DATA:  Intraoperative film for lumbar decompression at L3-4 and L4-5  EXAM: PORTABLE SPINE - 1 VIEW  COMPARISON:  Preoperative lumbar spine films of 09/21/2014  FINDINGS: On intraoperative lateral lumbar spine film from the operating room labeled 1, the more cephalad needle is directed toward the inferior spinous process of L2. The more caudal needle is directed toward the inferior spinous process of L4 for localization.  IMPRESSION: Needles placed for localization as described above.   Electronically Signed   By: Ivar Drape M.D.   On: 09/27/2014 08:13    EKG: Independently reviewed. Telemetry, sinus rhythm.   Time spent: 75 minutes.   Niel Hummer A [facility] Pager [phone]  If 7PM-7AM, please contact night-coverage [email or web address] Password [email or web address] 09/27/2014, 12:11 PM

## 2014-09-27 NOTE — Anesthesia Preprocedure Evaluation (Addendum)
Anesthesia Evaluation  Patient identified by MRN, date of birth, ID band Patient awake    Reviewed: Allergy & Precautions, NPO status , Patient's Chart, lab work & pertinent test results  Airway Mallampati: II  TM Distance: >3 FB Neck ROM: Full    Dental  (+) Teeth Intact, Dental Advisory Given   Pulmonary sleep apnea , former smoker,  breath sounds clear to auscultation        Cardiovascular hypertension, Pt. on medications + dysrhythmias Atrial Fibrillation Rhythm:Irregular Rate:Normal     Neuro/Psych negative neurological ROS     GI/Hepatic negative GI ROS, Neg liver ROS,   Endo/Other  Morbid obesity  Renal/GU negative Renal ROS     Musculoskeletal  (+) Arthritis -,   Abdominal   Peds  Hematology negative hematology ROS (+)   Anesthesia Other Findings   Reproductive/Obstetrics                            Anesthesia Physical Anesthesia Plan  ASA: III  Anesthesia Plan: General   Post-op Pain Management:    Induction: Intravenous  Airway Management Planned: Oral ETT  Additional Equipment:   Intra-op Plan:   Post-operative Plan: Extubation in OR  Informed Consent: I have reviewed the patients History and Physical, chart, labs and discussed the procedure including the risks, benefits and alternatives for the proposed anesthesia with the patient or authorized representative who has indicated his/her understanding and acceptance.   Dental advisory given  Plan Discussed with: CRNA  Anesthesia Plan Comments:         Anesthesia Quick Evaluation

## 2014-09-27 NOTE — Anesthesia Postprocedure Evaluation (Signed)
  Anesthesia Post-op Note  Patient: Alec Hunt  Procedure(s) Performed: Procedure(s): LUMBAR LAMINECTOMY/DECOMPRESSION MICRODISCECTOMY 3 LEVELS L4-5 L3-4 POSSIBLE L2-3 BILATERAL FORAMINOTOMIES AT L-3,4 AND 5 (N/A)  Patient Location: PACU  Anesthesia Type:General  Level of Consciousness: awake and alert   Airway and Oxygen Therapy: Patient Spontanous Breathing  Post-op Pain: mild  Post-op Assessment: Post-op Vital signs reviewed  Post-op Vital Signs: Reviewed  Last Vitals:  Filed Vitals:   09/27/14 1255  BP: 154/75  Pulse: 72  Temp: 36.6 C  Resp: 16    Complications: No apparent anesthesia complications

## 2014-09-27 NOTE — Interval H&P Note (Signed)
History and Physical Interval Note:  09/27/2014 7:24 AM  Alec Hunt  has presented today for surgery, with the diagnosis of STENOSIS L4-5 L3-4  The various methods of treatment have been discussed with the patient and family. After consideration of risks, benefits and other options for treatment, the patient has consented to  Procedure(s): LUMBAR LAMINECTOMY/DECOMPRESSION MICRODISCECTOMY 3 LEVELS L4-5 L3-4 POSSIBLE L2-3 (N/A) as a surgical intervention .  The patient's history has been reviewed, patient examined, no change in status, stable for surgery.  I have reviewed the patient's chart and labs.  Questions were answered to the patient's satisfaction.     [name]

## 2014-09-27 NOTE — Op Note (Deleted)
NAME:  THORN, DEMAS NO.:  [id number]  MEDICAL RECORD NO.:  [id number]  LOCATION:                               FACILITY:  [facility]  PHYSICIAN:  Susa Day, M.D.    DATE OF BIRTH:  May 12, 1951  DATE OF PROCEDURE:  09/27/2014 DATE OF DISCHARGE:  09/29/2014                              OPERATIVE REPORT   PREOPERATIVE DIAGNOSES: 1. Severe spinal stenosis at L3-L4 and L4-L5. 2. Morbid obesity.  POSTOPERATIVE DIAGNOSES: 1. Severe spinal stenosis at L3-L4 and L4-L5. 2. Morbid obesity. 3. BMI of 46.  Note, the patient's technical difficulty increases the     patient's morbid obesity at 64.  PROCEDURE PERFORMED: 1. Microlumbar decompression at L3-L4 and L4-L5. 2. Bilateral foraminotomies at L5, L4, and L3. 3. Laminectomy of L4.  ANESTHESIA:  General.  ASSISTANT:  Cleophas Dunker, PA was needed throughout the case for the patient positioning, gentle intermittent neural traction, closure, and assistance over the operating microscope.  HISTORY:  This is a 64 year old, with neurogenic claudication, secondary spinal stenosis of the disc herniation at work, aggravated his underlying spinal stenosis that was multifactorial including epidural lipomatosis.  Failing conservative treatment was indicated for lumbar decompression at L3-L4 and L4-L5 after myelogram indicated severe spinal stenosis and weakness in his quadriceps.  We discussed the risks and benefits, including bleeding, infection, damage to neurovascular, DVT, PE, anesthetic complications, etc.  The patient had a history of AFib and required __________.  He was cleared preoperatively by his medical physician.  He has not had an episode of AFib over the past couple of years.  TECHNIQUE:  With the patient in supine position, after the induction of adequate anesthesia, 3 g of Kefzol, Foley to gravity, and positioning, which required additional time and multiple personality to the patient's size.  The  lumbar region was prepped and draped in the usual sterile fashion.  Two 18-gauge spinal needles were utilized to localize the L3- L4 and L4-L5 interspace.  Incision was made from above the spinous process of 3 to below 5.  Subcutaneous tissue was dissected. Electrocautery was utilized to achieve hemostasis.  He had a very deep subcutaneous adipose tissue.  The dorsolumbar fascia was identified and divided in line with the skin incision.  Paraspinous muscles were infiltrated with 0.25% Marcaine with epinephrine for hemostasis.  We divided the dorsal lumbar fascia.  We elevated the paraspinous musculature from L3-L4 and L4-L5.  The deep blades of the McCullough retractor were utilized with the extra long tray secured.  Confirmatory radiograph obtained with a Kocher on the spinous process of L5 and on L2.  This identified L3, L4, and L5.  Following this, we used a Leksell rongeur to remove the spinous processes of 4, partial of L3 and L5 with fairly hard bone.  This was used with a Leksell rongeur after removal of the spinous process.  We began with the decompression in the central portion of the lamina L4.  With a 2 mm long Kerrison, we began a central decompression with removal of the neural arch, we used a 2 mm Kerrison centrally to remove the neural arch.  There was hypertrophic ligamentum noted as well as facet arthropathy at  L3-L4 and L4-L5 and epidural lipomatosis was evident.  With a Woodson retractor protecting the thecal sac at all times as well as the neural patty, we proceeded with meticulously removing the neural arch of the Gill laminectomy, performing decompressions of the lateral recess with severe compression of the L5 roots and L4 roots bilaterally.  We performed foraminotomies of L5 and L4 bilaterally as well as L4 bilaterally.  We continued with removal of ligamentum flavum of the interspace at L3-L4 as well.  There was hypertrophic ligamentum flavum here, as well as  epidural lipomatosis creating severe central and lateral recess stenosis.  The area was the most stenotic just at the top of the neural arch of L4.  We decompressed the lateral recesses to the medial border of the pedicle from both sides of the operating room table.  We meticulously performed foraminotomies at L3, L4, and L5 bilaterally.  Bipolar electrocautery was utilized to achieve hemostasis.  We used bone wax on the cancellous surfaces.  We identified the disc at L4-L5, and from both sides it was found to be slightly soft, but hardened.  I feel it was not creating any neural compression, therefore, it was preserved.  The same at L3-L4.  We obtained a confirmatory radiograph with a neural probe passed above the pedicle of L3 and below the pedicle of L5.  Confirming our levels at decompression.  This was consistent with that seen on the MRI.  A neural probe passed freely up the foramen at L3, L4, and L5 bilaterally, with good restoration of thecal sac.  No evidence of CSF leakage or active bleeding.  We copiously irrigated the wound with antibiotic irrigation, and we obtained meticulous hemostasis.  This included a bone wax on the cancellous surfaces, bipolar electrocautery, and thrombin-soaked Gelfoam.  We removed the McCullough retractor, paraspinous muscles was inspected, no evidence of active bleeding.  We copiously irrigated the paraspinous musculature.  Dorsolumbar fascia was reapproximated with #1 Vicryl in a figure-of-eight sutures in a running V-Loc.  Both in the superior and inferior aspect of the wound, there was a centimeter aperture to allow for any bleeding that may occur.  We used multiple layers of 2-0 for the subcutaneous adipose tissue.  This required multiple layers for closure due to the patient's size.  The skin was then reapproximated with staples.  Wound was dressed sterilely.  He was then meticulously moved to the supine to the hospital bed, Foley to gravity,  extubated without difficulty, and transported to the Recovery Room in satisfactory condition.  The patient tolerated the procedure well.  No complications.  Blood loss was 100 mL.     Susa Day, M.D.     Geralynn Rile  D:  09/27/2014  T:  09/27/2014  Job:  [id number]

## 2014-09-27 NOTE — Brief Op Note (Signed)
09/27/2014  9:46 AM  PATIENT:  Alec Hunt  64 y.o. male  PRE-OPERATIVE DIAGNOSIS:  STENOSIS L4-5 L3-4  POST-OPERATIVE DIAGNOSIS:  STENOSIS L4-5 L3-4  PROCEDURE:  Procedure(s): LUMBAR LAMINECTOMY/DECOMPRESSION MICRODISCECTOMY 3 LEVELS L4-5 L3-4 POSSIBLE L2-3 BILATERAL FORAMINOTOMIES AT L-3,4 AND 5 (N/A)  SURGEON:  Surgeon(s) and Role:    * Susa Day, MD - Primary  PHYSICIAN ASSISTANT:   ASSISTANTS: Bissell   ANESTHESIA:   general  EBL:  Total I/O In: 2000 [I.V.:2000] Out: 425 [Urine:175; Blood:250]  BLOOD ADMINISTERED:none  DRAINS: none   LOCAL MEDICATIONS USED:  MARCAINE     SPECIMEN:  No Specimen  DISPOSITION OF SPECIMEN:  N/A  COUNTS:  YES  TOURNIQUET:  * No tourniquets in log *  DICTATION: .Other Dictation: Dictation Number [id number]  PLAN OF CARE: Admit for overnight observation  PATIENT DISPOSITION:  PACU - hemodynamically stable.   Delay start of Pharmacological VTE agent (>24hrs) due to surgical blood loss or risk of bleeding: yes

## 2014-09-27 NOTE — Op Note (Signed)
NAME:  Alec Hunt, Alec Hunt NO.:  [id number]  MEDICAL RECORD NO.:  [id number]  LOCATION:                               FACILITY:  [facility]  PHYSICIAN:  Susa Day, M.D.    DATE OF BIRTH:  [birth year]  DATE OF PROCEDURE:  09/27/2014 DATE OF DISCHARGE:  09/29/2014                              OPERATIVE REPORT   PREOPERATIVE DIAGNOSES: 1. Severe spinal stenosis at L3-L4 and L4-L5. 2. Morbid obesity.  POSTOPERATIVE DIAGNOSES: 1. Severe spinal stenosis at L3-L4 and L4-L5. 2. Morbid obesity. 3. BMI of 46.  Note, the patient's technical difficulty increases the     patient's morbid obesity at 62.  PROCEDURE PERFORMED: 1. Microlumbar decompression at L3-L4 and L4-L5. 2. Bilateral foraminotomies at L5, L4, and L3. 3. Laminectomy of L4.  ANESTHESIA:  General.  ASSISTANT:  Cleophas Dunker, PA was needed throughout the case for the patient positioning, gentle intermittent neural traction, closure, and assistance over the operating microscope.  HISTORY:  This is a 64 year old, with neurogenic claudication, secondary spinal stenosis of the disc herniation at work, aggravated his underlying spinal stenosis that was multifactorial including epidural lipomatosis.  Failing conservative treatment was indicated for lumbar decompression at L3-L4 and L4-L5 after myelogram indicated severe spinal stenosis and weakness in his quadriceps.  We discussed the risks and benefits, including bleeding, infection, damage to neurovascular, DVT, PE, anesthetic complications, etc.  The patient had a history of AFib and required __________.  He was cleared preoperatively by his medical physician.  He has not had an episode of AFib over the past couple of years.  TECHNIQUE:  With the patient in supine position, after the induction of adequate anesthesia, 3 g of Kefzol, Foley to gravity, and positioning, which required additional time and multiple personality to the patient's size.  The  lumbar region was prepped and draped in the usual sterile fashion.  Two 18-gauge spinal needles were utilized to localize the L3- L4 and L4-L5 interspace.  Incision was made from above the spinous process of 3 to below 5.  Subcutaneous tissue was dissected. Electrocautery was utilized to achieve hemostasis.  He had a very deep subcutaneous adipose tissue.  The dorsolumbar fascia was identified and divided in line with the skin incision.  Paraspinous muscles were infiltrated with 0.25% Marcaine with epinephrine for hemostasis.  We divided the dorsal lumbar fascia.  We elevated the paraspinous musculature from L3-L4 and L4-L5.  The deep blades of the McCullough retractor were utilized with the extra long tray secured.  Confirmatory radiograph obtained with a Kocher on the spinous process of L5 and on L2.  This identified L3, L4, and L5.  Following this, we used a Leksell rongeur to remove the spinous processes of 4, partial of L3 and L5 with fairly hard bone.  This was used with a Leksell rongeur after removal of the spinous process.  We began with the decompression in the central portion of the lamina L4.  With a 2 mm long Kerrison, we began a central decompression with removal of the neural arch, we used a 2 mm Kerrison centrally to remove the neural arch.  There was hypertrophic ligamentum noted as well as facet arthropathy at  L3-L4 and L4-L5 and epidural lipomatosis was evident.  With a Woodson retractor protecting the thecal sac at all times as well as the neural patty, we proceeded with meticulously removing the neural arch of the Gill laminectomy, performing decompressions of the lateral recess with severe compression of the L5 roots and L4 roots bilaterally.  We performed foraminotomies of L5 and L4 bilaterally as well as L4 bilaterally.  We continued with removal of ligamentum flavum of the interspace at L3-L4 as well.  There was hypertrophic ligamentum flavum here, as well as  epidural lipomatosis creating severe central and lateral recess stenosis.  The area was the most stenotic just at the top of the neural arch of L4.  We decompressed the lateral recesses to the medial border of the pedicle from both sides of the operating room table.  We meticulously performed foraminotomies at L3, L4, and L5 bilaterally.  Bipolar electrocautery was utilized to achieve hemostasis.  We used bone wax on the cancellous surfaces.  We identified the disc at L4-L5, and from both sides it was found to be slightly soft, but hardened.  I feel it was not creating any neural compression, therefore, it was preserved.  The same at L3-L4.  We obtained a confirmatory radiograph with a neural probe passed above the pedicle of L3 and below the pedicle of L5.  Confirming our levels at decompression.  This was consistent with that seen on the MRI.  A neural probe passed freely up the foramen at L3, L4, and L5 bilaterally, with good restoration of thecal sac.  No evidence of CSF leakage or active bleeding.  We copiously irrigated the wound with antibiotic irrigation, and we obtained meticulous hemostasis.  This included a bone wax on the cancellous surfaces, bipolar electrocautery, and thrombin-soaked Gelfoam.  We removed the McCullough retractor, paraspinous muscles was inspected, no evidence of active bleeding.  We copiously irrigated the paraspinous musculature.  Dorsolumbar fascia was reapproximated with #1 Vicryl in a figure-of-eight sutures in a running V-Loc.  Both in the superior and inferior aspect of the wound, there was a centimeter aperture to allow for any bleeding that may occur.  We used multiple layers of 2-0 for the subcutaneous adipose tissue.  This required multiple layers for closure due to the patient's size.  The skin was then reapproximated with staples.  Wound was dressed sterilely.  He was then meticulously moved to the supine to the hospital bed, Foley to gravity,  extubated without difficulty, and transported to the Recovery Room in satisfactory condition.  The patient tolerated the procedure well.  No complications.  Blood loss was 100 mL.     Susa Day, M.D.     Geralynn Rile  D:  09/27/2014  T:  09/27/2014  Job:  [id number]

## 2014-09-27 NOTE — Transfer of Care (Signed)
Immediate Anesthesia Transfer of Care Note  Patient: Alec Hunt  Procedure(s) Performed: Procedure(s): LUMBAR LAMINECTOMY/DECOMPRESSION MICRODISCECTOMY 3 LEVELS L4-5 L3-4 POSSIBLE L2-3 BILATERAL FORAMINOTOMIES AT L-3,4 AND 5 (N/A)  Patient Location: PACU  Anesthesia Type:General  Level of Consciousness: awake, sedated and patient cooperative  Airway & Oxygen Therapy: Patient Spontanous Breathing and Patient connected to face mask oxygen  Post-op Assessment: Report given to RN and Post -op Vital signs reviewed and stable  Post vital signs: Reviewed and stable  Last Vitals:  Filed Vitals:   09/27/14 0508  BP: 160/80  Pulse: 62  Temp: 36.7 C  Resp: 18    Complications: No apparent anesthesia complications

## 2014-09-27 NOTE — H&P (View-Only) (Signed)
Alec Hunt is an 64 y.o. male.   Chief Complaint: back and leg pain HPI: The patient is a 64 year old male who presents today for follow up of their back. The patient is being followed for their back pain. They are now 1 year(s) out from injury (DOI 09/14/13). Symptoms reported today include: pain. The patient states that they are doing 50 percent better. Current treatment includes: home exercise program (stretches). The following medication has been used for pain control: Ultram and Robacin. The patient reports their current pain level to be mild. The patient presents today following CT/Myelogram. Note for "Follow-up back": The patient was placed on a 10lb. lifting restriction and sedentary work only. NCM Alec Hunt filling in for Alec Hunt.  Alec Hunt follows up with a CT myelogram. He is out of work. He is here with Alec Hunt, his case Freight forwarder. His CT myelogram indicated he has severe stenosis at L4-5 and at L3-4. This is multi-factorial. He does have a disk herniation at L4-5 with an underlying central canal stenosis. There is bilateral L4 and L5 nerve root impingement. He reports it is better when he sits. He has problems with ambulation. Last week he had a flare when he stood up that produced 9-10 on the pain scale.  REVIEW OF SYSTEMS: Review of systems is negative for fevers, chest pain, shortness of breath, unexplained recent weight loss, loss of bowel or bladder function, burning with urination, joint swelling, rashes, weakness or numbness, difficulty with balance, easy bruising, excessive thirst or frequent urination.  He is currently on Coumadin for atrial fibrillation. He reports not having an atrial fibrillation episode for years.  Past Medical History  Diagnosis Date  . Atrial fibrillation   . Arthritis     kness & shoulders  . PAF (paroxysmal atrial fibrillation)   . GERD (gastroesophageal reflux disease)   . Obesity   . Syncope     history of in the setting of  afib  . OSA (obstructive sleep apnea)     Severe w AHI 81.28/hr now on Bipap  . Hypertension     Past Surgical History  Procedure Laterality Date  . Knee arthroscopic    . Colonoscopy      Family History  Problem Relation Age of Onset  . CAD Mother   . Heart attack Mother   . Heart disease Mother   . Hypertension Brother    Social History:  reports that he quit smoking about 13 years ago. He has never used smokeless tobacco. He reports that he does not drink alcohol or use illicit drugs.  Allergies: No Known Allergies   (Not in a hospital admission)  Results for orders placed or performed in visit on 09/19/14 (from the past 48 hour(s))  Basic Metabolic Panel (BMET)     Status: Abnormal   Collection Time: 09/19/14  8:35 AM  Result Value Ref Range   Sodium 136 135 - 145 mEq/L   Potassium 3.9 3.5 - 5.1 mEq/L   Chloride 106 96 - 112 mEq/L   CO2 27 19 - 32 mEq/L   Glucose, Bld 118 (H) 70 - 99 mg/dL   BUN 14 6 - 23 mg/dL   Creatinine, Ser 0.94 0.40 - 1.50 mg/dL   Calcium 9.3 8.4 - 10.5 mg/dL   GFR 103.70 >60.00 mL/min   No results found.  Review of Systems  Constitutional: Negative.   HENT: Negative.   Eyes: Negative.   Respiratory: Negative.   Cardiovascular: Negative.  Gastrointestinal: Negative.   Genitourinary: Negative.   Musculoskeletal: Positive for back pain.  Skin: Negative.   Neurological: Positive for focal weakness.  Psychiatric/Behavioral: Negative.     There were no vitals taken for this visit. Physical Exam  Constitutional: He is oriented to person, place, and time. He appears well-developed and well-nourished.  HENT:  Head: Normocephalic and atraumatic.  Eyes: Conjunctivae and EOM are normal. Pupils are equal, round, and reactive to light.  Neck: Normal range of motion. Neck supple.  Cardiovascular: Normal rate and regular rhythm.   Respiratory: Effort normal and breath sounds normal.  GI: Soft. Bowel sounds are normal.  Musculoskeletal:   He is upright and in mild distress. Mood and affect appropriate. He walks with a forward flexed antalgic gait. He has bilateral quad weakness noted.  Lumbar spine exam reveals no evidence of soft tissue swelling, ecchymosis or deformity. The abdomen is soft and nontender. Nontender over the trochanters. No cellulitis or lymphadenopathy.  Limited extension and end flexion secondary to back and leg pain. Straight leg raise is negative. Motor is 5/5 including EHL, tibialis anterior, plantar flexion, quadriceps and hamstrings. Patient is normoreflexia. There is no Babinski or clonus. Sensory exam is intact to light touch. The patient has good distal pulses. No DVT. No pain and normal range of motion without instability of the hips, knees and ankles.  Inspection of the cervical spine reveals a normal lordosis without evidence of paraspinous spasms or soft tissue swelling. Nontender to palpation. Full flexion, full extension, full left and right lateral rotation. Extension combined with lateral flexion does not reproduce pain. Negative impingement sign, negative secondary impingement sign of the shoulders. Negative Tinel's at median and ulnar nerves at the elbow. Negative carpal compression test at the wrists. Motor of the upper extremities is 5/5 including biceps, triceps, brachioradialis,wrist flexion, wrist extension, finger flexion, finger extension. Reflexes are normoreflexic. Sensory exam is intact to light touch. There is no Hoffmann sign. Nontender over the thoracic spine.  Neurological: He is alert and oriented to person, place, and time. He has normal reflexes.  Skin: Skin is warm and dry.  Psychiatric: He has a normal mood and affect.    CT myelogram reviewed. This demonstrates a severe stenosis at L3-4 and L4-5. There is no instability.  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 Aggravated with a work injury as well  as producing a disk herniation with underlying stenosis generating a severe spinal stenosis and neurogenic claudication.  We discussed options. We discussed living with the symptoms. He has only had temporary relief from his injection versus a lumbar decompression. This involved two levels.  I had an extensive discussion of the risks and benefits of the lumbar decompression with the patient including bleeding, infection, damage to neurovascular structures, epidural fibrosis, CSF leak requiring repair. We also discussed increase in pain, adjacent segment disease, recurrent disc herniation, need for future surgery including repeat decompression and/or fusion. We also discussed risks of postoperative hematoma, paralysis, anesthetic complications including DVT, PE, death, cardiopulmonary dysfunction. In addition, the perioperative and postoperative courses were discussed in detail including the rehabilitative time and return to functional activity and work. I provided the patient with an illustrated handout and utilized the appropriate surgical models.  He will be required to be off his Coumadin and post operatively for five days. An option would be to use an aspirin peri-operatively for decompression, this should be appropriate. He reports his symptoms are severe. He is  having a very difficult time maintaining his activity level and weight. He is 89.  In the interim, he will be sedentary, no lifting over 10 pounds. We discussed this separately and in front of the patient with Alec Hunt, his case manager regarding stenosis, his mechanism of injury, his current options. He will Hunt his cardiologist, Vonna Drafts, tomorrow. I recommend a preoperative anesthesia consult as well for management of his atrial fibrillation and discussion with Dr. Radford Pax concerning appropriate anti-coagulation. This is related to his initial injury. I spent considerable time discussing all of his issues.  Plan microlumbar  decompression L4-5, L3-4, possible L2-3  Zareen Jamison M. PA-C for Dr. Tonita Cong 09/20/2014, 5:03 PM

## 2014-09-28 ENCOUNTER — Encounter (HOSPITAL_COMMUNITY): Payer: Self-pay | Admitting: Specialist

## 2014-09-28 DIAGNOSIS — I480 Paroxysmal atrial fibrillation: Secondary | ICD-10-CM

## 2014-09-28 DIAGNOSIS — I10 Essential (primary) hypertension: Secondary | ICD-10-CM

## 2014-09-28 DIAGNOSIS — M4806 Spinal stenosis, lumbar region: Secondary | ICD-10-CM | POA: Diagnosis not present

## 2014-09-28 LAB — CBC
HEMATOCRIT: 38.2 % — AB (ref 39.0–52.0)
HEMOGLOBIN: 12.6 g/dL — AB (ref 13.0–17.0)
MCH: 29.4 pg (ref 26.0–34.0)
MCHC: 33 g/dL (ref 30.0–36.0)
MCV: 89 fL (ref 78.0–100.0)
Platelets: 230 10*3/uL (ref 150–400)
RBC: 4.29 MIL/uL (ref 4.22–5.81)
RDW: 12.8 % (ref 11.5–15.5)
WBC: 10.5 10*3/uL (ref 4.0–10.5)

## 2014-09-28 LAB — BASIC METABOLIC PANEL
Anion gap: 5 (ref 5–15)
BUN: 10 mg/dL (ref 6–23)
CO2: 26 mmol/L (ref 19–32)
Calcium: 8.7 mg/dL (ref 8.4–10.5)
Chloride: 107 mmol/L (ref 96–112)
Creatinine, Ser: 0.99 mg/dL (ref 0.50–1.35)
GFR calc non Af Amer: 85 mL/min — ABNORMAL LOW (ref >90)
Glucose, Bld: 154 mg/dL — ABNORMAL HIGH (ref 70–99)
Potassium: 3.9 mmol/L (ref 3.5–5.1)
Sodium: 138 mmol/L (ref 135–145)

## 2014-09-28 MED ORDER — LOSARTAN POTASSIUM 50 MG PO TABS
100.0000 mg | ORAL_TABLET | Freq: Every day | ORAL | Status: DC
  Administered 2014-09-28 – 2014-09-29 (×2): 100 mg via ORAL
  Filled 2014-09-28 (×2): qty 2

## 2014-09-28 NOTE — Progress Notes (Signed)
Physical Therapy Treatment Patient Details Name: Alec Hunt MRN: [id number] DOB: [birth year] Today's Date: 2014-10-06    History of Present Illness Pt is s/p LUMBAR LAMINECTOMY/DECOMPRESSION MICRODISCECTOMY 3 LEVELS L4-5 L3-4 POSSIBLE L2-3 BILATERAL FORAMINOTOMIES AT L-3,4 AND 5     PT Comments    Pt progressing well with mobility and hopeful for dc in am.  Follow Up Recommendations  No PT follow up     Equipment Recommendations  Rolling walker with 5" wheels    Recommendations for Other Services OT consult     Precautions / Restrictions Precautions Precautions: Back Precaution Comments: Pt recalls all back precautions with min cueing Restrictions Weight Bearing Restrictions: No    Mobility  Bed Mobility Overal bed mobility: Needs Assistance Bed Mobility: Supine to Sit Rolling: Supervision Sidelying to sit: Min guard Supine to sit: Supervision        Transfers Overall transfer level: Needs assistance Equipment used: Rolling walker (2 wheeled) Transfers: Sit to/from Stand Sit to Stand: Supervision         General transfer comment: cues for transition position, use of UEs to self assist and adherence to back precautions  Ambulation/Gait Ambulation/Gait assistance: Min guard Ambulation Distance (Feet): 300 Feet Assistive device: Rolling walker (2 wheeled) Gait Pattern/deviations: Step-through pattern;Decreased step length - right;Decreased step length - left;Shuffle Gait velocity: decr   General Gait Details: cues for posture and position from [employer]            Wheelchair Mobility    Modified Rankin (Stroke Patients Only)       Balance                                    Cognition Arousal/Alertness: Awake/alert Behavior During Therapy: WFL for tasks assessed/performed Overall Cognitive Status: Within Functional Limits for tasks assessed                      Exercises      General Comments         Pertinent Vitals/Pain Pain Assessment: 0-10 Pain Score: 3  Pain Location: back Pain Descriptors / Indicators: Aching Pain Intervention(s): Limited activity within patient's tolerance;Monitored during session;Premedicated before session;Ice applied    Home Living                      Prior Function            PT Goals (current goals can now be found in the care plan section) Acute Rehab PT Goals Patient Stated Goal: return to more independence. PT Goal Formulation: With patient Time For Goal Achievement: 10/05/14 Potential to Achieve Goals: Good Progress towards PT goals: Progressing toward goals    Frequency  7X/week    PT Plan Current plan remains appropriate    Co-evaluation             End of Session   Activity Tolerance: Patient tolerated treatment well Patient left: in chair;with call bell/phone within reach     Time: 5465-6812 PT Time Calculation (min) (ACUTE ONLY): 23 min  Charges:  $Gait Training: 23-37 mins                    G Codes:      Sharalee Witman Oct 06, 2014, 5:28 PM

## 2014-09-28 NOTE — Evaluation (Signed)
Occupational Therapy Evaluation Patient Details Name: Alec Hunt MRN: [id number] DOB: 1951-06-10 Today's Date: 09/28/2014    History of Present Illness Pt is s/p LUMBAR LAMINECTOMY/DECOMPRESSION MICRODISCECTOMY 3 LEVELS L4-5 L3-4 POSSIBLE L2-3 BILATERAL FORAMINOTOMIES AT L-3,4 AND 5    Clinical Impression   Pt did well. Began education on 3in1 transfers and toilet aid for hygiene at the commode. Pt has some of the other AE and would benefit from further AE education. Will follow on acute to progress ADL independence for d/c home with wife.    Follow Up Recommendations  No OT follow up;Supervision/Assistance - 24 hour    Equipment Recommendations  3 in 1 bedside comode (wide 3in1)    Recommendations for Other Services       Precautions / Restrictions        Mobility Bed Mobility Overal bed mobility: Needs Assistance Bed Mobility: Rolling;Sidelying to Sit;Sit to Sidelying Rolling: Min guard Sidelying to sit: Min guard     Sit to sidelying: Min guard General bed mobility comments: verbal cues for log roll technique and increased time.  Transfers Overall transfer level: Needs assistance Equipment used: Rolling walker (2 wheeled) Transfers: Sit to/from Stand Sit to Stand: Min guard         General transfer comment: close guard for safety. verbal cues for hand placement. min assist to descend to 3in1 and bed.     Balance                                            ADL Overall ADL's : Needs assistance/impaired Eating/Feeding: Independent;Sitting   Grooming: Wash/dry hands;Set up;Sitting   Upper Body Bathing: Set up;Sitting;Supervision/ safety   Lower Body Bathing: Maximal assistance;Sit to/from stand   Upper Body Dressing : Minimal assistance;Sitting   Lower Body Dressing: Maximal assistance;Sit to/from stand   Toilet Transfer: Minimal assistance;Ambulation;BSC;RW   Toileting- Clothing Manipulation and Hygiene: Sit to/from  stand;Moderate assistance         General ADL Comments: Pt unable to reach posterior periarea for hygiene at the commode. Educated on toilet aid option. pt has all AE for LB bath/dressing except the LHS. Wife states she can help with washing feet. Pt with 5-6/10 when up but 3-4 at rest. Pt able to verbalize all back precautions and reviewed how to apply to ADL and handout.      Vision     Perception     Praxis      Pertinent Vitals/Pain Pain Assessment: 0-10 Pain Score: 6  Pain Location: back Pain Descriptors / Indicators: Aching Pain Intervention(s): Repositioned     Hand Dominance     Extremity/Trunk Assessment Upper Extremity Assessment Upper Extremity Assessment: Overall WFL for tasks assessed           Communication Communication Communication: No difficulties   Cognition Arousal/Alertness: Awake/alert Behavior During Therapy: WFL for tasks assessed/performed Overall Cognitive Status: Within Functional Limits for tasks assessed                     General Comments       Exercises       Shoulder Instructions      Home Living Family/patient expects to be discharged to:: Private residence Living Arrangements: Spouse/significant other Available Help at Discharge: Family               Bathroom Shower/Tub: Walk-in shower  Bathroom Toilet: Handicapped height     Home Equipment: Financial controller: Reacher;Sock aid;Long-handled shoe horn        Prior Functioning/Environment Level of Independence: Needs assistance    ADL's / Homemaking Assistance Needed: wife assisted with LB self care.         OT Diagnosis: Generalized weakness   OT Problem List: Decreased strength;Decreased knowledge of use of DME or AE;Decreased knowledge of precautions   OT Treatment/Interventions: Self-care/ADL training;Patient/family education;Therapeutic activities;DME and/or AE instruction    OT Goals(Current goals can be found in the  care plan section) Acute Rehab OT Goals Patient Stated Goal: return to more independence. OT Goal Formulation: With patient/family Time For Goal Achievement: 10/05/14 Potential to Achieve Goals: Good  OT Frequency: Min 2X/week   Barriers to D/C:            Co-evaluation              End of Session Equipment Utilized During Treatment: Rolling walker  Activity Tolerance: Patient tolerated treatment well Patient left: in bed;with call bell/phone within reach;with family/visitor present   Time: 6578-4696 OT Time Calculation (min): 31 min Charges:  OT General Charges $OT Visit: 1 Procedure OT Evaluation $Initial OT Evaluation Tier I: 1 Procedure OT Treatments $Therapeutic Activity: 8-22 mins G-Codes: OT G-codes **NOT FOR INPATIENT CLASS** Functional Assessment Tool Used: clinical judgement Functional Limitation: Self care Self Care Current Status (E9528): At least 40 percent but less than 60 percent impaired, limited or restricted Self Care Goal Status (U1324): At least 1 percent but less than 20 percent impaired, limited or restricted  [place], [employer] 09/28/2014, 11:37 AM

## 2014-09-28 NOTE — Evaluation (Signed)
Physical Therapy Evaluation Patient Details Name: Alec Hunt MRN: [id number] DOB: [birth year] Today's Date: 09/28/2014   History of Present Illness  Pt is s/p LUMBAR LAMINECTOMY/DECOMPRESSION MICRODISCECTOMY 3 LEVELS L4-5 L3-4 POSSIBLE L2-3 BILATERAL FORAMINOTOMIES AT L-3,4 AND 5   Clinical Impression  Pt s/p lumbar decompression presents with functional mobility limitations 2* post op pain and back precautions.  Pt should progress well to dc home with family assist.    Follow Up Recommendations No PT follow up    Equipment Recommendations  Rolling walker with 5" wheels (Wide RW)    Recommendations for Other Services OT consult     Precautions / Restrictions Precautions Precautions: Back Restrictions Weight Bearing Restrictions: No      Mobility  Bed Mobility Overal bed mobility: Needs Assistance Bed Mobility: Rolling;Sidelying to Sit;Sit to Sidelying Rolling: Min guard Sidelying to sit: Min guard Supine to sit: Min assist Sit to supine: Min assist;Min guard Sit to sidelying: Min guard General bed mobility comments: verbal cues for log roll technique and increased time.  Transfers Overall transfer level: Needs assistance Equipment used: Rolling walker (2 wheeled) Transfers: Sit to/from Stand Sit to Stand: Min guard         General transfer comment: close guard for safety. verbal cues for hand placement. min assist to descend to 3in1 and bed.   Ambulation/Gait Ambulation/Gait assistance: Min assist;Min guard Ambulation Distance (Feet): 111 Feet Assistive device: Rolling walker (2 wheeled) Gait Pattern/deviations: Step-through pattern;Decreased step length - right;Decreased step length - left;Shuffle;Trunk flexed Gait velocity: decr   General Gait Details: cues for posture and position from [employer]            Wheelchair Mobility    Modified Rankin (Stroke Patients Only)       Balance                                              Pertinent Vitals/Pain Pain Assessment: 0-10 Pain Score: 6  Pain Location: back Pain Descriptors / Indicators: Aching Pain Intervention(s): Repositioned    Home Living Family/patient expects to be discharged to:: Private residence Living Arrangements: Spouse/significant other Available Help at Discharge: Family Type of Home: House Home Access: Stairs to enter Entrance Stairs-Rails: None Technical brewer of Steps: 1 Home Layout: One level Home Equipment: Adaptive equipment      Prior Function Level of Independence: Needs assistance      ADL's / Homemaking Assistance Needed: wife assisted with LB self care.         Hand Dominance   Dominant Hand: Right    Extremity/Trunk Assessment   Upper Extremity Assessment: Overall WFL for tasks assessed           Lower Extremity Assessment: Overall WFL for tasks assessed         Communication   Communication: No difficulties  Cognition Arousal/Alertness: Awake/alert Behavior During Therapy: WFL for tasks assessed/performed Overall Cognitive Status: Within Functional Limits for tasks assessed                      General Comments      Exercises        Assessment/Plan    PT Assessment Patient needs continued PT services  PT Diagnosis Difficulty walking   PT Problem List Decreased activity tolerance;Decreased mobility;Decreased knowledge of use of DME;Obesity;Pain;Decreased knowledge of precautions  PT Treatment Interventions  DME instruction;Gait training;Stair training;Functional mobility training;Therapeutic activities;Therapeutic exercise;Patient/family education   PT Goals (Current goals can be found in the Care Plan section) Acute Rehab PT Goals Patient Stated Goal: return to more independence. PT Goal Formulation: With patient Time For Goal Achievement: 10/05/14 Potential to Achieve Goals: Good    Frequency 7X/week   Barriers to discharge        Co-evaluation                End of Session   Activity Tolerance: Patient tolerated treatment well Patient left: in bed;with call bell/phone within reach;with family/visitor present Nurse Communication: Mobility status    Functional Assessment Tool Used: clinical judgement Functional Limitation: Mobility: Walking and moving around Mobility: Walking and Moving Around Current Status (D7824): At least 20 percent but less than 40 percent impaired, limited or restricted Mobility: Walking and Moving Around Goal Status [phone]): At least 1 percent but less than 20 percent impaired, limited or restricted    Time: 1443-1540 PT Time Calculation (min) (ACUTE ONLY): 37 min   Charges:   PT Evaluation $Initial PT Evaluation Tier I: 1 Procedure PT Treatments $Gait Training: 8-22 mins   PT G Codes:   PT G-Codes **NOT FOR INPATIENT CLASS** Functional Assessment Tool Used: clinical judgement Functional Limitation: Mobility: Walking and moving around Mobility: Walking and Moving Around Current Status (G8676): At least 20 percent but less than 40 percent impaired, limited or restricted Mobility: Walking and Moving Around Goal Status [phone]): At least 1 percent but less than 20 percent impaired, limited or restricted    [facility] 09/28/2014, 12:29 PM

## 2014-09-28 NOTE — Progress Notes (Signed)
[facility] PROGRESS NOTE  JAQUANN GUARISCO GYK:[id number] DOB: 27-Mar-1951 DOA: 09/27/2014 PCP: Gavin Pound, MD  Assessment/Plan: 1. Paroxysmal atrial fibrillation -Patient remains in sinus rhythm, will continue Cardizem 180 mg by mouth daily and Bystolic 2.5 mg by mouth daily. -Patient's Coumadin was held for surgical intervention, plan to discharge patient on Coumadin, I don't think he'll require a Lovenox bridge  2.  Hypertension. -His blood pressures have been trending up over the course the day, having last blood pressure 159/95, will restart his losartan at 100 mg by mouth daily meanwhile continue Cardizem 180 mg by mouth daily and Bystolic 2.5 mg by mouth daily  Code Status: Full code Family Communication: I spoke to his wife was present at bedside Disposition Plan:    HPI/Subjective: Patient is a pleasant 64 year old gentleman with a past medical history paroxysmal atrial fibrillation, hypertension, chronic back pain and spinal stenosis to underwent elective L4/L5 laminectomy on 09/27/2014. Consulted for medical management. During this hospitalization he has remained in sinus rhythm and is on Cardizem 180 mg by mouth daily. His blood pressures increasing on 09/28/2014 for which losartan was added back. Patient otherwise reported feeling better having no new complaints.  Objective: Filed Vitals:   09/28/14 0920  BP: 159/95  Pulse: 67  Temp: 97.9 F (36.6 C)  Resp: 17    Intake/Output Summary (Last 24 hours) at 09/28/14 1213 Last data filed at 09/28/14 1047  Gross per 24 hour  Intake   2126 ml  Output   3725 ml  Net  -1599 ml   Filed Weights   09/27/14 0525 09/27/14 1155  Weight: 158.305 kg (349 lb) 158.305 kg (349 lb)    Exam:   General:  Patient is in no acute distress, he is awake, alert, reports feeling better today.  Cardiovascular: Regular rate and rhythm normal S1-S2 no murmurs rubs or gallops  Respiratory: Normal respiratory effort, lungs are  clear to auscultation bilaterally  Abdomen: Soft, nontender nondistended positive bowel sounds  Musculoskeletal: No edema   Data Reviewed: Basic Metabolic Panel:  Recent Labs Lab 09/28/14 0433  NA 138  K 3.9  CL 107  CO2 26  GLUCOSE 154*  BUN 10  CREATININE 0.99  CALCIUM 8.7   Liver Function Tests: No results for input(s): AST, ALT, ALKPHOS, BILITOT, PROT, ALBUMIN in the last 168 hours. No results for input(s): LIPASE, AMYLASE in the last 168 hours. No results for input(s): AMMONIA in the last 168 hours. CBC:  Recent Labs Lab 09/28/14 0433  WBC 10.5  HGB 12.6*  HCT 38.2*  MCV 89.0  PLT 230   Cardiac Enzymes: No results for input(s): CKTOTAL, CKMB, CKMBINDEX, TROPONINI in the last 168 hours. BNP (last 3 results) No results for input(s): BNP in the last 8760 hours.  ProBNP (last 3 results) No results for input(s): PROBNP in the last 8760 hours.  CBG: No results for input(s): GLUCAP in the last 168 hours.  Recent Results (from the past 240 hour(s))  Surgical pcr screen     Status: None   Collection Time: 09/21/14  8:25 AM  Result Value Ref Range Status   MRSA, PCR NEGATIVE NEGATIVE Final   Staphylococcus aureus NEGATIVE NEGATIVE Final    Comment:        The Xpert SA Assay (FDA approved for NASAL specimens in patients over 56 years of age), is one component of a comprehensive surveillance program.  Test performance has been validated by [facility] for patients greater than or equal to 1  year old. It is not intended to diagnose infection nor to guide or monitor treatment.      Studies: Dg Spine Portable 1 View  09/27/2014   CLINICAL DATA:  Intraoperative film 3.  Spinal numbering.  EXAM: PORTABLE SPINE - 1 VIEW  COMPARISON:  Radiography from earlier today. Lumbar myelography 09/11/2014.  FINDINGS: Spinal numbering as on previous radiograph, with the spinous processes numbered as requested. Lower lumbar laminectomies with retractors and probes from  the inferior L3 vertebra to the inferior L5 vertebra.  IMPRESSION: Intraoperative spinal numbering as above.   Electronically Signed   By: Monte Fantasia M.D.   On: 09/27/2014 09:35   Dg Spine Portable 1 View  09/27/2014   CLINICAL DATA:  Spine surgery.  EXAM: PORTABLE SPINE - 1 VIEW  COMPARISON:  09/17/2014.  FINDINGS: Lumbar spine is labeled with the lowest segmented appearing vertebra on lateral view as L5 . Metallic markers noted posteriorly at the L4-L5 disc space. Diffuse degenerative change with minimal anterolisthesis L4 on L5.  IMPRESSION: Metallic marker noted at the L4-L5 disc space level.   Electronically Signed   By: Marcello Moores  Register   On: 09/27/2014 08:35   Dg Spine Portable 1 View  09/27/2014   CLINICAL DATA:  Intraoperative film for lumbar decompression at L3-4 and L4-5  EXAM: PORTABLE SPINE - 1 VIEW  COMPARISON:  Preoperative lumbar spine films of 09/21/2014  FINDINGS: On intraoperative lateral lumbar spine film from the operating room labeled 1, the more cephalad needle is directed toward the inferior spinous process of L2. The more caudal needle is directed toward the inferior spinous process of L4 for localization.  IMPRESSION: Needles placed for localization as described above.   Electronically Signed   By: Ivar Drape M.D.   On: 09/27/2014 08:13    Scheduled Meds: . diltiazem  180 mg Oral Daily  . docusate sodium  100 mg Oral BID  . losartan  100 mg Oral Daily  . nebivolol  2.5 mg Oral QPM   Continuous Infusions: . dextrose 5 % and 0.45 % NaCl with KCl 20 mEq/L 20 mL/hr at 09/28/14 8325    Principal Problem:   Spinal stenosis of lumbar region Active Problems:   Morbid obesity   OSA (obstructive sleep apnea)   Atrial fibrillation   Hypertension   Spinal stenosis at L4-L5 level    Time spent: 25 minutes    Kelvin Cellar  [facility] Pager [phone]. If 7PM-7AM, please contact night-coverage at [email or web address], password [facility] 09/28/2014, 12:13 PM

## 2014-09-28 NOTE — Progress Notes (Addendum)
Subjective: 1 Day Post-Op Procedure(s) (LRB): LUMBAR LAMINECTOMY/DECOMPRESSION MICRODISCECTOMY 3 LEVELS L4-5 L3-4 POSSIBLE L2-3 BILATERAL FORAMINOTOMIES AT L-3,4 AND 5 (N/A) Patient reports pain as 4 on 0-10 scale.    Objective: Vital signs in last 24 hours: Temp:  [97.2 F (36.2 C)-98.9 F (37.2 C)] 98.5 F (36.9 C) (03/31 0504) Pulse Rate:  [65-87] 65 (03/31 0504) Resp:  [11-19] 16 (03/31 0504) BP: (117-181)/(71-128) 147/74 mmHg (03/31 0504) SpO2:  [96 %-100 %] 98 % (03/31 0504) Weight:  [158.305 kg (349 lb)] 158.305 kg (349 lb) (03/30 1155)  Intake/Output from previous day: 03/30 0701 - 03/31 0700 In: 4570 [P.O.:360; I.V.:4160; IV Piggyback:50] Out: 3975 [Urine:3725; Blood:250] Intake/Output this shift:     Recent Labs  09/28/14 0433  HGB 12.6*    Recent Labs  09/28/14 0433  WBC 10.5  RBC 4.29  HCT 38.2*  PLT 230    Recent Labs  09/28/14 0433  NA 138  K 3.9  CL 107  CO2 26  BUN 10  CREATININE 0.99  GLUCOSE 154*  CALCIUM 8.7    Recent Labs  09/27/14 0540  INR 1.12    ABD soft Sensation intact distally Dorsiflexion/Plantar flexion intact Incision: dressing C/D/I  Assessment/Plan: 1 Day Post-Op Procedure(s) (LRB): LUMBAR LAMINECTOMY/DECOMPRESSION MICRODISCECTOMY 3 LEVELS L4-5 L3-4 POSSIBLE L2-3 BILATERAL FORAMINOTOMIES AT L-3,4 AND 5 (N/A) Advance diet Up with therapy D/C IV fluids  Hold lisinopril  per medicine D/c tomorrow  Alec Hunt C 09/28/2014, 7:03 AM

## 2014-09-28 NOTE — Care Management Note (Signed)
    Page 1 of 1   09/28/2014     2:08:56 PM CARE MANAGEMENT NOTE 09/28/2014  Patient:  Alec Hunt, Alec Hunt   Account Number:  [id number]  Date Initiated:  09/28/2014  Documentation initiated by:  [facility]  Subjective/Objective Assessment:   adm: LUMBAR LAMINECTOMY/DECOMPRESSION MICRODISCECTOMY 3 LEVELS L4-5 L3-4 POSSIBLE L2-3 BILATERAL FORAMINOTOMIES AT L-3,4 AND 5       Action/Plan:   discharge planning   Anticipated DC Date:  09/29/2014   Anticipated DC Plan:        [place]  CM consult      Choice offered to / List presented to:     DME arranged  3-N-1  [place]           Status of service:  Completed, signed off Medicare Important Message given?   (If response is "NO", the following Medicare IM given date fields will be blank) Date Medicare IM given:   Medicare IM given by:   Date Additional Medicare IM given:   Additional Medicare IM given by:    Discharge Disposition:  HOME/SELF CARE  Per UR Regulation:    If discussed at Long Length of Stay Meetings, dates discussed:    Comments:  09/28/14 14:00 CM met with pt in room who gave CM WC contact Shirley [place] [phone]. CM called Enid Derry who requested I fax orders, facesheet, dc plan to [phone]. CM faxed per Enid Derry with request to please deliver the [facility] rolling walker and bari 3n1 to room prior to discharge.  No other CM needs were communicated.  Mariane Masters, BSN, [place].

## 2014-09-29 DIAGNOSIS — I10 Essential (primary) hypertension: Secondary | ICD-10-CM | POA: Diagnosis not present

## 2014-09-29 DIAGNOSIS — I480 Paroxysmal atrial fibrillation: Secondary | ICD-10-CM | POA: Diagnosis not present

## 2014-09-29 DIAGNOSIS — M4806 Spinal stenosis, lumbar region: Secondary | ICD-10-CM | POA: Diagnosis not present

## 2014-09-29 LAB — BASIC METABOLIC PANEL
ANION GAP: 6 (ref 5–15)
BUN: 13 mg/dL (ref 6–23)
CALCIUM: 8.7 mg/dL (ref 8.4–10.5)
CO2: 27 mmol/L (ref 19–32)
CREATININE: 1.08 mg/dL (ref 0.50–1.35)
Chloride: 104 mmol/L (ref 96–112)
GFR calc Af Amer: 82 mL/min — ABNORMAL LOW (ref >90)
GFR, EST NON AFRICAN AMERICAN: 71 mL/min — AB (ref >90)
Glucose, Bld: 123 mg/dL — ABNORMAL HIGH (ref 70–99)
Potassium: 3.7 mmol/L (ref 3.5–5.1)
Sodium: 137 mmol/L (ref 135–145)

## 2014-09-29 MED ORDER — WARFARIN SODIUM 5 MG PO TABS: ORAL_TABLET | ORAL | Status: DC

## 2014-09-29 NOTE — Discharge Summary (Signed)
Physician Discharge Summary   Patient ID: Alec Hunt MRN: [id number] DOB/AGE: [birth year] 64 y.o.  Admit date: 09/27/2014 Discharge date: 09/29/2014  Primary Diagnosis:   STENOSIS L4-5 L3-4  Admission Diagnoses:  Past Medical History  Diagnosis Date  . Atrial fibrillation   . Arthritis     kness & shoulders  . PAF (paroxysmal atrial fibrillation)   . Obesity   . Syncope     history of in the setting of afib  . OSA (obstructive sleep apnea)     Severe w AHI 81.28/hr now on Bipap  . Hypertension   . Dysrhythmia    Discharge Diagnoses:   Principal Problem:   Spinal stenosis of lumbar region Active Problems:   Morbid obesity   OSA (obstructive sleep apnea)   Atrial fibrillation   Hypertension   Spinal stenosis at L4-L5 level  Procedure:  Procedure(s) (LRB): LUMBAR LAMINECTOMY/DECOMPRESSION MICRODISCECTOMY 3 LEVELS L4-5 L3-4 POSSIBLE L2-3 BILATERAL FORAMINOTOMIES AT L-3,4 AND 5 (N/A)   Consults: hospitalists for hx of A.Fib and HTN, medical mgmt  HPI:  see H&P    Laboratory Data: Office Visit on 09/19/2014  Component Date Value Ref Range Status  . Sodium 09/19/2014 136  135 - 145 mEq/L Final  . Potassium 09/19/2014 3.9  3.5 - 5.1 mEq/L Final  . Chloride 09/19/2014 106  96 - 112 mEq/L Final  . CO2 09/19/2014 27  19 - 32 mEq/L Final  . Glucose, Bld 09/19/2014 118* 70 - 99 mg/dL Final  . BUN 09/19/2014 14  6 - 23 mg/dL Final  . Creatinine, Ser 09/19/2014 0.94  0.40 - 1.50 mg/dL Final  . Calcium 09/19/2014 9.3  8.4 - 10.5 mg/dL Final  . GFR 09/19/2014 103.70  >60.00 mL/min Final    Recent Labs  09/28/14 0433  HGB 12.6*    Recent Labs  09/28/14 0433  WBC 10.5  RBC 4.29  HCT 38.2*  PLT 230    Recent Labs  09/28/14 0433 09/29/14 0440  NA 138 137  K 3.9 3.7  CL 107 104  CO2 26 27  BUN 10 13  CREATININE 0.99 1.08  GLUCOSE 154* 123*  CALCIUM 8.7 8.7    Recent Labs  09/27/14 0540  INR 1.12    X-Rays:Dg Lumbar Spine 2-3  Views  09/21/2014   CLINICAL DATA:  Lumbar stenosis, preop planning  EXAM: LUMBAR SPINE - 2-3 VIEW  COMPARISON:  CT myelogram 09/11/2014  FINDINGS: Numbering scheme reflects that used on previous study. There is no evidence of lumbar spine fracture. Alignment is normal. Mild narrowing of the L4-5 interspace. Sclerosis and DJD involving bilateral facet joints L4-5 and L5-S1.  IMPRESSION: 1. Facet DJD L4-S1.   Electronically Signed   By: Lucrezia Europe M.D.   On: 09/21/2014 10:36   Ct Lumbar Spine W Contrast  09/11/2014   CLINICAL DATA:  Low back pain. Neurogenic claudication. Symptoms for several months, but worsening. Initial encounter.  EXAM: LUMBAR MYELOGRAM  FLUOROSCOPY TIME:  1 minutes 18 seconds corresponding to a Dose Area Product of 210 dGy*cm2  PROCEDURE: After thorough discussion of risks and benefits of the procedure including bleeding, infection, injury to nerves, blood vessels, adjacent structures as well as headache and CSF leak, written and oral informed consent was obtained. Consent was obtained by Dr. Rolla Flatten. Time out form was completed.  Patient was positioned prone on the fluoroscopy table. Local anesthesia was provided with 1% lidocaine without epinephrine after prepped and draped in the usual sterile fashion. Puncture was  performed at L2-L3 using a 3 1/2 inch 22-gauge spinal needle via midline approach. Using a single pass through the dura, the needle was placed within the thecal sac, with return of clear CSF. 15 mL of Omnipaque-180 was injected into the thecal sac, with normal opacification of the nerve roots and cauda equina consistent with free flow within the subarachnoid space.  I personally performed the lumbar puncture and administered the intrathecal contrast. I also personally supervised acquisition of the myelogram images.  TECHNIQUE: Contiguous axial images were obtained through the Lumbar spine after the intrathecal infusion of infusion. Coronal and sagittal reconstructions were  obtained of the axial image sets.  COMPARISON:  Outside MRI dated 10/22/2013.  FINDINGS: LUMBAR MYELOGRAM FINDINGS:  Good opacification lumbar subarachnoid space. Severe stenosis L3-4 and L4-5 is circumferential. BILATERAL L4 and L5 nerve root impingement is present at these levels. Similar changes of a less severe nature are present at L2-L3. Normal alignment without dynamic instability. Side-to-side excursion did not result in appreciable worsening.  CT LUMBAR MYELOGRAM FINDINGS:  Segmentation: Normal.  Alignment:  Normal.  Vertebrae: No worrisome osseous lesion.Minor anterior wedging at L2 and L3 unchanged from prior MR.  Conus medullaris: Normal in size,   and location.  Paraspinal tissues: No evidence for paravertebral mass or inflammatory process.  Disc levels:  L1-L2:  Mild bulge.  No impingement.  L2-L3: Mild bulge. Mild facet arthropathy. Anterior spurring. Slight subarticular zone narrowing without significant impingement.  L3-L4: Disc osteophyte complex projects into the BILATERAL subarticular and foraminal zones without significant central component. Abundant ventral epidural fat is greater on the LEFT at the L4 mid vertebral body level; this affects the LEFT greater than RIGHT L4 nerve roots (see for instance image 65 series 5). Moderately severe central canal stenosis, with compression of the thecal sac, also worse on the LEFT, is noted. Abundant dorsal epidural fat at the interspace level is noncompressive.  L4-L5: Central and leftward protrusion with disc osteophyte complex. Advanced facet and ligamentum flavum hypertrophy. Moderate to severe central canal stenosis is exacerbated by abundant ventral epidural fat at the mid L5 vertebral body level. LEFT greater than RIGHT L4 and L5 nerve root impingement is observed.  L5-S1: Mild bulge. Moderate facet arthropathy. Abundant epidural fat dorsally and ventrally. Thecal sac is mildly narrowed.  IMPRESSION: LUMBAR MYELOGRAM IMPRESSION:  Severe two level  spinal stenosis at L3-4 and L4-5 is multifactorial. No dynamic instability. No demonstrable worsening with side to side bending.  CT LUMBAR MYELOGRAM IMPRESSION:  Moderately severe multifactorial spinal stenosis at L3-4 and L4-5 relates to disc and posterior element pathology, as well as epidural fat. 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   Electronically Signed   By: Rolla Flatten M.D.   On: 09/11/2014 17:09   Dg Spine Portable 1 View  09/27/2014   CLINICAL DATA:  Intraoperative film 3.  Spinal numbering.  EXAM: PORTABLE SPINE - 1 VIEW  COMPARISON:  Radiography from earlier today. Lumbar myelography 09/11/2014.  FINDINGS: Spinal numbering as on previous radiograph, with the spinous processes numbered as requested. Lower lumbar laminectomies with retractors and probes from the inferior L3 vertebra to the inferior L5 vertebra.  IMPRESSION: Intraoperative spinal numbering as above.   Electronically Signed   By: Monte Fantasia M.D.   On: 09/27/2014 09:35   Dg Spine Portable 1 View  09/27/2014   CLINICAL DATA:  Spine surgery.  EXAM: PORTABLE SPINE - 1 VIEW  COMPARISON:  09/17/2014.  FINDINGS: Lumbar spine is labeled with the lowest segmented appearing vertebra on lateral view as L5 . Metallic markers noted posteriorly at the L4-L5 disc space. Diffuse degenerative change with minimal anterolisthesis L4 on L5.  IMPRESSION: Metallic marker noted at the L4-L5 disc space level.   Electronically Signed   By: Marcello Moores  Register   On: 09/27/2014 08:35   Dg Spine Portable 1 View  09/27/2014   CLINICAL DATA:  Intraoperative film for lumbar decompression at L3-4 and L4-5  EXAM: PORTABLE SPINE - 1 VIEW  COMPARISON:  Preoperative lumbar spine films of 09/21/2014  FINDINGS: On intraoperative lateral lumbar spine film from the operating room labeled 1, the more cephalad needle is directed toward the inferior spinous process of L2. The  more caudal needle is directed toward the inferior spinous process of L4 for localization.  IMPRESSION: Needles placed for localization as described above.   Electronically Signed   By: Ivar Drape M.D.   On: 09/27/2014 08:13   Dg Myelography Lumbar Inj Lumbosacral  09/11/2014   CLINICAL DATA:  Low back pain. Neurogenic claudication. Symptoms for several months, but worsening. Initial encounter.  EXAM: LUMBAR MYELOGRAM  FLUOROSCOPY TIME:  1 minutes 18 seconds corresponding to a Dose Area Product of 210 dGy*cm2  PROCEDURE: After thorough discussion of risks and benefits of the procedure including bleeding, infection, injury to nerves, blood vessels, adjacent structures as well as headache and CSF leak, written and oral informed consent was obtained. Consent was obtained by Dr. Rolla Flatten. Time out form was completed.  Patient was positioned prone on the fluoroscopy table. Local anesthesia was provided with 1% lidocaine without epinephrine after prepped and draped in the usual sterile fashion. Puncture was performed at L2-L3 using a 3 1/2 inch 22-gauge spinal needle via midline approach. Using a single pass through the dura, the needle was placed within the thecal sac, with return of clear CSF. 15 mL of Omnipaque-180 was injected into the thecal sac, with normal opacification of the nerve roots and cauda equina consistent with free flow within the subarachnoid space.  I personally performed the lumbar puncture and administered the intrathecal contrast. I also personally supervised acquisition of the myelogram images.  TECHNIQUE: Contiguous axial images were obtained through the Lumbar spine after the intrathecal infusion of infusion. Coronal and sagittal reconstructions were obtained of the axial image sets.  COMPARISON:  Outside MRI dated 10/22/2013.  FINDINGS: LUMBAR MYELOGRAM FINDINGS:  Good opacification lumbar subarachnoid space. Severe stenosis L3-4 and L4-5 is circumferential. BILATERAL L4 and L5 nerve root  impingement is present at these levels. Similar changes of a less severe nature are present at L2-L3. Normal alignment without dynamic instability. Side-to-side excursion did not result in appreciable worsening.  CT LUMBAR MYELOGRAM FINDINGS:  Segmentation: Normal.  Alignment:  Normal.  Vertebrae: No worrisome osseous lesion.Minor anterior wedging at L2 and L3 unchanged from prior MR.  Conus medullaris: Normal in size,   and location.  Paraspinal tissues: No evidence for paravertebral mass or inflammatory process.  Disc levels:  L1-L2:  Mild bulge.  No impingement.  L2-L3: Mild bulge. Mild facet arthropathy. Anterior spurring. Slight subarticular zone narrowing without significant impingement.  L3-L4: Disc osteophyte complex projects into the BILATERAL subarticular and foraminal zones without significant central component. Abundant ventral epidural fat is greater on the LEFT at the L4 mid vertebral body level; this affects the LEFT greater than RIGHT L4 nerve roots (see for instance image 65 series 5). Moderately severe central canal  stenosis, with compression of the thecal sac, also worse on the LEFT, is noted. Abundant dorsal epidural fat at the interspace level is noncompressive.  L4-L5: Central and leftward protrusion with disc osteophyte complex. Advanced facet and ligamentum flavum hypertrophy. Moderate to severe central canal stenosis is exacerbated by abundant ventral epidural fat at the mid L5 vertebral body level. LEFT greater than RIGHT L4 and L5 nerve root impingement is observed.  L5-S1: Mild bulge. Moderate facet arthropathy. Abundant epidural fat dorsally and ventrally. Thecal sac is mildly narrowed.  IMPRESSION: LUMBAR MYELOGRAM IMPRESSION:  Severe two level spinal stenosis at L3-4 and L4-5 is multifactorial. No dynamic instability. No demonstrable worsening with side to side bending.  CT LUMBAR MYELOGRAM IMPRESSION:  Moderately severe multifactorial spinal stenosis at L3-4 and L4-5 relates to disc  and posterior element pathology, as well as epidural fat. 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   Electronically Signed   By: Rolla Flatten M.D.   On: 09/11/2014 17:09    EKG: Orders placed or performed during the hospital encounter of 09/21/14  . EKG 12-Lead  . EKG 12-Lead     Hospital Course: Patient was admitted to [facility] and taken to the OR and underwent the above state procedure without complications.  Patient tolerated the procedure well and was later transferred to the recovery room and then to the orthopaedic floor for postoperative care.  They were given PO and IV analgesics for pain control following their surgery.  They were given 24 hours of postoperative antibiotics.   PT was consulted postop to assist with mobility and transfers.  The patient was allowed to be WBAT with therapy and was taught back precautions. Discharge planning was consulted to help with postop disposition and equipment needs.  Patient had a good night on the evening of surgery and started to get up OOB with therapy on day one. Patient was seen in rounds and was ready to go home on day two.  They were given discharge instructions and dressing directions.  They were instructed on when to follow up in the office with Dr. Tonita Cong.   Diet: low sodium heart healthy Activity:WBAT; Lspine precautions Follow-up:in 2 weeks Disposition - Home Discharged Condition: good   Discharge Instructions    Call MD / Call 911    Complete by:  As directed   If you experience chest pain or shortness of breath, CALL 911 and be transported to the hospital emergency room.  If you develope a fever above 101 F, pus (white drainage) or increased drainage or redness at the wound, or calf pain, call your surgeon's office.     Constipation Prevention    Complete by:  As directed   Drink plenty of fluids.  Prune juice may be helpful.  You  may use a stool softener, such as Colace (over the counter) 100 mg twice a day.  Use MiraLax (over the counter) for constipation as needed.     Diet - low sodium heart healthy    Complete by:  As directed      Increase activity slowly as tolerated    Complete by:  As directed             Medication List    STOP taking these medications        acetaminophen 500 MG tablet  Commonly known as:  TYLENOL      TAKE these medications  diltiazem 180 MG 24 hr capsule  Commonly known as:  CARDIZEM CD  TAKE ONE CAPSULE EVERY DAY     docusate sodium 100 MG capsule  Commonly known as:  COLACE  Take 1 capsule (100 mg total) by mouth 2 (two) times daily as needed for mild constipation.     losartan 100 MG tablet  Commonly known as:  COZAAR  Take 1 tablet (100 mg total) by mouth every evening.     methocarbamol 500 MG tablet  Commonly known as:  ROBAXIN  Take 1 tablet (500 mg total) by mouth every 8 (eight) hours as needed for muscle spasms.     nebivolol 5 MG tablet  Commonly known as:  BYSTOLIC  Take 0.5 tablets (2.5 mg total) by mouth every evening.     oxyCODONE-acetaminophen 5-325 MG per tablet  Commonly known as:  PERCOCET  Take 1-2 tablets by mouth every 4 (four) hours as needed.     traMADol 50 MG tablet  Commonly known as:  ULTRAM  Take 50 mg by mouth every 6 (six) hours as needed for moderate pain.     warfarin 5 MG tablet  Commonly known as:  COUMADIN  - TAKE 1 & 1/2 TABS ON ALL DAYS EXCEPT 2 TABS ON MONDAY, WED. & FRI. OR AS DIRECTED BY CLINIC  - Resume 5 days post-op           Follow-up Information    Follow up with BEANE,JEFFREY C, MD In 2 weeks.   Specialty:  Orthopedic Surgery   Why:  For suture removal   Contact information:   [address] [place] [address] [phone]       Follow up with Sueanne Margarita, MD. Schedule an appointment as soon as possible for a visit in 1 week.   Specialty:  Cardiology   Why:  for  coumadin management- to resume 5 days post-op   Contact information:   [address] [place] [address] [phone]       Signed: Lacie Draft, PA-C for Dr. Tonita Cong Orthopaedic Surgery 09/29/2014, 7:56 AM

## 2014-09-29 NOTE — Care Management Note (Signed)
[id number]-Alec Hunt/will check on equip and call back concerning delivery.

## 2014-09-29 NOTE — Progress Notes (Signed)
Physical Therapy Treatment Patient Details Name: Alec Hunt MRN: [id number] DOB: 07/19/1950 Today's Date: October 01, 2014    History of Present Illness Pt is s/p LUMBAR LAMINECTOMY/DECOMPRESSION MICRODISCECTOMY 3 LEVELS L4-5 L3-4 POSSIBLE L2-3 BILATERAL FORAMINOTOMIES AT L-3,4 AND 5     PT Comments    Pt mobilizing well and eager for return home  Follow Up Recommendations  No PT follow up     Equipment Recommendations  Rolling walker with 5" wheels    Recommendations for Other Services OT consult     Precautions / Restrictions Precautions Precautions: Back Precaution Comments: Pt recalls all back precautions without cues Restrictions Weight Bearing Restrictions: No    Mobility  Bed Mobility Overal bed mobility: Needs Assistance Bed Mobility: Supine to Sit Rolling: Supervision Sidelying to sit: Supervision          Transfers Overall transfer level: Needs assistance Equipment used: Rolling walker (2 wheeled) Transfers: Sit to/from Stand Sit to Stand: Supervision         General transfer comment: min cues for transition position  Ambulation/Gait Ambulation/Gait assistance: Supervision Ambulation Distance (Feet): 350 Feet Assistive device: Rolling walker (2 wheeled) Gait Pattern/deviations: Step-through pattern;Decreased step length - right;Decreased step length - left;Shuffle Gait velocity: decr       Stairs Stairs: Yes Stairs assistance: Min assist Stair Management: No rails Number of Stairs: 1 General stair comments: min assist for managing RW only  Wheelchair Mobility    Modified Rankin (Stroke Patients Only)       Balance                                    Cognition Arousal/Alertness: Awake/alert Behavior During Therapy: WFL for tasks assessed/performed Overall Cognitive Status: Within Functional Limits for tasks assessed                      Exercises      General Comments        Pertinent Vitals/Pain  Pain Assessment: 0-10 Pain Score: 2  Pain Location: back Pain Descriptors / Indicators: Tightness Pain Intervention(s): Limited activity within patient's tolerance;Monitored during session;Premedicated before session    Home Living                      Prior Function            PT Goals (current goals can now be found in the care plan section) Acute Rehab PT Goals Patient Stated Goal: return to more independence. PT Goal Formulation: With patient Time For Goal Achievement: 10/05/14 Potential to Achieve Goals: Good Progress towards PT goals: Progressing toward goals    Frequency  7X/week    PT Plan Current plan remains appropriate    Co-evaluation             End of Session   Activity Tolerance: Patient tolerated treatment well Patient left: in chair;with call bell/phone within reach;with family/visitor present     Time: 0812-0837 PT Time Calculation (min) (ACUTE ONLY): 25 min  Charges:  $Gait Training: 23-37 mins                    G Codes:      Carole Deere 10-01-14, 9:06 AM

## 2014-09-29 NOTE — Progress Notes (Signed)
OT Cancellation Note  Patient Details Name: Alec Hunt MRN: [id number] DOB: 06/16/1951   Cancelled Treatment:    Reason Eval/Treat Not Completed: Other (comment) Pt states he feels comfortable with ADL and has been up to the 3in1 again and feels comfortable with technique. Pt's wife assisted him to dress this and he states he knows how to use AE and wife will assist with LB bathing. He verbalizes understanding of how to step into shower and declines need to practice.   [employer], [place] 09/29/2014, 9:46 AM

## 2014-09-29 NOTE — Progress Notes (Addendum)
Subjective: 2 Days Post-Op Procedure(s) (LRB): LUMBAR LAMINECTOMY/DECOMPRESSION MICRODISCECTOMY 3 LEVELS L4-5 L3-4 POSSIBLE L2-3 BILATERAL FORAMINOTOMIES AT L-3,4 AND 5 (N/A) Patient reports pain as mild.  Reports incisional back pain, well controlled with pain medications. Leg pain improved. Tolerating ambulation well, did well with PT yesterday. Feels ready to go home today. Voiding without difficulty. + Flatus, no BM yet.  Objective: Vital signs in last 24 hours: Temp:  [97.7 F (36.5 C)-98.2 F (36.8 C)] 97.7 F (36.5 C) (04/01 0555) Pulse Rate:  [45-98] 45 (04/01 0555) Resp:  [16-17] 16 (04/01 0555) BP: (122-159)/(58-95) 144/90 mmHg (04/01 0555) SpO2:  [98 %-99 %] 99 % (04/01 0555)  Intake/Output from previous day: 03/31 0701 - 04/01 0700 In: 936 [P.O.:936] Out: 1565 [Urine:1565] Intake/Output this shift:     Recent Labs  09/28/14 0433  HGB 12.6*    Recent Labs  09/28/14 0433  WBC 10.5  RBC 4.29  HCT 38.2*  PLT 230    Recent Labs  09/28/14 0433 09/29/14 0440  NA 138 137  K 3.9 3.7  CL 107 104  CO2 26 27  BUN 10 13  CREATININE 0.99 1.08  GLUCOSE 154* 123*  CALCIUM 8.7 8.7    Recent Labs  09/27/14 0540  INR 1.12    Neurologically intact ABD soft Neurovascular intact Sensation intact distally Intact pulses distally Dorsiflexion/Plantar flexion intact Incision: dressing C/D/I and no drainage No cellulitis present Compartment soft no calf pain or sign of DVT  Assessment/Plan: 2 Days Post-Op Procedure(s) (LRB): LUMBAR LAMINECTOMY/DECOMPRESSION MICRODISCECTOMY 3 LEVELS L4-5 L3-4 POSSIBLE L2-3 BILATERAL FORAMINOTOMIES AT L-3,4 AND 5 (N/A) Advance diet Up with therapy D/C IV fluids  Dressing change prior to D/C- aquacel with hypofix tape at the distal aspect of the dressing to avoid rolling up Discussed Lspine precautions, D/C instructions, dressing instructions To resume coumadin 5 days post-op and follow up with Dr. Radford Pax for  anticoagulation management Appreciate hospitalist input- has remained in sinus rhythm, all cardiac meds have been resumed as his BP increased yesterday Follow up in 2 weeks with Dr. Tonita Cong for staple removal   Lacie Draft M. 09/29/2014, 7:49 AM

## 2014-09-29 NOTE — Progress Notes (Signed)
[facility] PROGRESS NOTE  Alec Hunt ZOX:[id number] DOB: 20-Dec-1950 DOA: 09/27/2014 PCP: Gavin Pound, MD  Assessment/Plan: 1. Paroxysmal atrial fibrillation -Patient remains in sinus rhythm, will continue Cardizem 180 mg by mouth daily and Bystolic 2.5 mg by mouth daily. -May restart coumadin therapy in the outpatient setting, when deemed appropriate by surgery. Will not need lovenox bridge.   2.  Hypertension. -Recommend discharging him on his home regimen, PCP to follow up on his blood pressures.   Code Status: Full code Family Communication: I spoke to his wife was present at bedside Disposition Plan:   HPI/Subjective: Patient is a pleasant 64 year old gentleman with a past medical history paroxysmal atrial fibrillation, hypertension, chronic back pain and spinal stenosis to underwent elective L4/L5 laminectomy on 09/27/2014. Consulted for medical management. During this hospitalization he has remained in sinus rhythm and is on Cardizem 180 mg by mouth daily. His blood pressures increasing on 09/28/2014 for which losartan was added back. Patient otherwise reported feeling better having no new complaints.  Objective: Filed Vitals:   09/29/14 1339  BP: 137/77  Pulse: 78  Temp: 97.8 F (36.6 C)  Resp: 16    Intake/Output Summary (Last 24 hours) at 09/29/14 1617 Last data filed at 09/29/14 0430  Gross per 24 hour  Intake    240 ml  Output    650 ml  Net   -410 ml   Filed Weights   09/27/14 0525 09/27/14 1155  Weight: 158.305 kg (349 lb) 158.305 kg (349 lb)    Exam:   General:  Patient is in no acute distress, he is awake, alert, reports feeling better today.  Cardiovascular: Regular rate and rhythm normal S1-S2 no murmurs rubs or gallops  Respiratory: Normal respiratory effort, lungs are clear to auscultation bilaterally  Abdomen: Soft, nontender nondistended positive bowel sounds  Musculoskeletal: No edema   Data Reviewed: Basic Metabolic  Panel:  Recent Labs Lab 09/28/14 0433 09/29/14 0440  NA 138 137  K 3.9 3.7  CL 107 104  CO2 26 27  GLUCOSE 154* 123*  BUN 10 13  CREATININE 0.99 1.08  CALCIUM 8.7 8.7   Liver Function Tests: No results for input(s): AST, ALT, ALKPHOS, BILITOT, PROT, ALBUMIN in the last 168 hours. No results for input(s): LIPASE, AMYLASE in the last 168 hours. No results for input(s): AMMONIA in the last 168 hours. CBC:  Recent Labs Lab 09/28/14 0433  WBC 10.5  HGB 12.6*  HCT 38.2*  MCV 89.0  PLT 230   Cardiac Enzymes: No results for input(s): CKTOTAL, CKMB, CKMBINDEX, TROPONINI in the last 168 hours. BNP (last 3 results) No results for input(s): BNP in the last 8760 hours.  ProBNP (last 3 results) No results for input(s): PROBNP in the last 8760 hours.  CBG: No results for input(s): GLUCAP in the last 168 hours.  Recent Results (from the past 240 hour(s))  Surgical pcr screen     Status: None   Collection Time: 09/21/14  8:25 AM  Result Value Ref Range Status   MRSA, PCR NEGATIVE NEGATIVE Final   Staphylococcus aureus NEGATIVE NEGATIVE Final    Comment:        The Xpert SA Assay (FDA approved for NASAL specimens in patients over 56 years of age), is one component of a comprehensive surveillance program.  Test performance has been validated by [facility] for patients greater than or equal to 61 year old. It is not intended to diagnose infection nor to guide or monitor treatment.  Studies: No results found.  Scheduled Meds: . diltiazem  180 mg Oral Daily  . docusate sodium  100 mg Oral BID  . losartan  100 mg Oral Daily  . nebivolol  2.5 mg Oral QPM   Continuous Infusions:    Principal Problem:   Spinal stenosis of lumbar region Active Problems:   Morbid obesity   OSA (obstructive sleep apnea)   Atrial fibrillation   Hypertension   Spinal stenosis at L4-L5 level    Time spent: 20 minutes    Kelvin Cellar  [facility] Pager  [phone]. If 7PM-7AM, please contact night-coverage at [email or web address], password [facility] 09/29/2014, 4:17 PM

## 2014-09-29 NOTE — Discharge Instructions (Signed)
Walk As Tolerated utilizing back precautions.  No bending, twisting, or lifting.  No driving for 2 weeks.   Aquacel dressing may remain in place until follow up. May shower with aquacel dressing in place. If the dressing peels off or becomes saturated, you may remove aquacel dressing and place gauze and tape dressing which should be kept clean and dry and changed daily. See Dr. Tonita Cong in office in 2 weeks.  Resume coumadin 5 days post-op. Follow up with Dr. Radford Pax for recheck of your PT/INR- call Dr. Theodosia Blender office upon discharge from the hospital for follow up within a week post-op for coumadin management. Walk daily even outside. Use a cane or walker only if necessary. Avoid sitting on soft sofas.

## 2014-10-06 ENCOUNTER — Other Ambulatory Visit: Payer: Self-pay

## 2014-10-06 MED ORDER — NEBIVOLOL HCL 5 MG PO TABS: 2.5000 mg | ORAL_TABLET | Freq: Every evening | ORAL | Status: DC

## 2014-10-06 NOTE — Telephone Encounter (Signed)
Alec Margarita, MD at 09/18/2014 8:20 AM  nebivolol (BYSTOLIC) 5 MG tablet Take 0.5 tablets (2.5 mg total) by mouth every evening         ASSESSMENT AND PLAN:  1. OSA on BiPAP and tolerating well. I will get a download from his DME. 2. HTN well controlled - continue Diltiazem/losartan/Bystolic - check BMET today

## 2014-10-10 ENCOUNTER — Ambulatory Visit (INDEPENDENT_AMBULATORY_CARE_PROVIDER_SITE_OTHER): Payer: 59

## 2014-10-10 DIAGNOSIS — I4891 Unspecified atrial fibrillation: Secondary | ICD-10-CM | POA: Diagnosis not present

## 2014-10-10 LAB — POCT INR: INR: 1.8

## 2014-10-21 ENCOUNTER — Other Ambulatory Visit: Payer: Self-pay | Admitting: Cardiology

## 2014-10-31 ENCOUNTER — Encounter: Payer: Self-pay | Admitting: Cardiology

## 2014-11-03 ENCOUNTER — Other Ambulatory Visit: Payer: Self-pay

## 2014-11-03 MED ORDER — LOSARTAN POTASSIUM 100 MG PO TABS: 100.0000 mg | ORAL_TABLET | Freq: Every morning | ORAL | Status: DC

## 2014-12-20 ENCOUNTER — Ambulatory Visit (INDEPENDENT_AMBULATORY_CARE_PROVIDER_SITE_OTHER): Payer: 59 | Admitting: *Deleted

## 2014-12-20 DIAGNOSIS — I4891 Unspecified atrial fibrillation: Secondary | ICD-10-CM

## 2014-12-20 LAB — POCT INR: INR: 2.3

## 2014-12-24 ENCOUNTER — Other Ambulatory Visit: Payer: Self-pay | Admitting: Cardiology

## 2014-12-25 ENCOUNTER — Other Ambulatory Visit: Payer: Self-pay | Admitting: Cardiology

## 2015-01-04 ENCOUNTER — Encounter: Payer: Self-pay | Admitting: Cardiology

## 2015-01-08 ENCOUNTER — Telehealth: Payer: Self-pay | Admitting: Cardiology

## 2015-01-08 MED ORDER — LOSARTAN POTASSIUM 100 MG PO TABS: 100.0000 mg | ORAL_TABLET | Freq: Every morning | ORAL | Status: DC

## 2015-01-08 NOTE — Telephone Encounter (Signed)
New Message  Tech from [facility] calling about prescription for Losartin- Ins requires 90 day supply. [facility] to reflect 90 day supply. Please call back and discuss.

## 2015-01-08 NOTE — Telephone Encounter (Signed)
Resent Losartan prescription to reflect a 90 day supply.

## 2015-01-22 ENCOUNTER — Other Ambulatory Visit: Payer: Self-pay | Admitting: Cardiology

## 2015-01-22 ENCOUNTER — Other Ambulatory Visit: Payer: Self-pay

## 2015-01-22 MED ORDER — DILTIAZEM HCL ER COATED BEADS 180 MG PO CP24: 180.0000 mg | ORAL_CAPSULE | Freq: Every day | ORAL | Status: DC

## 2015-01-31 ENCOUNTER — Ambulatory Visit (INDEPENDENT_AMBULATORY_CARE_PROVIDER_SITE_OTHER): Payer: 59 | Admitting: *Deleted

## 2015-01-31 DIAGNOSIS — I4891 Unspecified atrial fibrillation: Secondary | ICD-10-CM | POA: Diagnosis not present

## 2015-01-31 LAB — POCT INR: INR: 2.2

## 2015-03-14 ENCOUNTER — Ambulatory Visit (INDEPENDENT_AMBULATORY_CARE_PROVIDER_SITE_OTHER): Payer: 59 | Admitting: *Deleted

## 2015-03-14 DIAGNOSIS — I4891 Unspecified atrial fibrillation: Secondary | ICD-10-CM | POA: Diagnosis not present

## 2015-03-14 LAB — POCT INR: INR: 2.1

## 2015-03-21 ENCOUNTER — Ambulatory Visit (INDEPENDENT_AMBULATORY_CARE_PROVIDER_SITE_OTHER): Payer: 59 | Admitting: Cardiology

## 2015-03-21 ENCOUNTER — Encounter: Payer: Self-pay | Admitting: Cardiology

## 2015-03-21 VITALS — BP 134/78 | HR 63 | Ht 73.5 in | Wt 331.8 lb

## 2015-03-21 DIAGNOSIS — I10 Essential (primary) hypertension: Secondary | ICD-10-CM

## 2015-03-21 DIAGNOSIS — G4733 Obstructive sleep apnea (adult) (pediatric): Secondary | ICD-10-CM | POA: Diagnosis not present

## 2015-03-21 DIAGNOSIS — I480 Paroxysmal atrial fibrillation: Secondary | ICD-10-CM | POA: Diagnosis not present

## 2015-03-21 LAB — BASIC METABOLIC PANEL
BUN: 15 mg/dL (ref 6–23)
CHLORIDE: 108 meq/L (ref 96–112)
CO2: 29 mEq/L (ref 19–32)
CREATININE: 0.99 mg/dL (ref 0.40–1.50)
Calcium: 9.1 mg/dL (ref 8.4–10.5)
GFR: 97.53 mL/min (ref >60.00)
GLUCOSE: 107 mg/dL — AB (ref 70–99)
Potassium: 3.9 mEq/L (ref 3.5–5.1)
Sodium: 139 mEq/L (ref 135–145)

## 2015-03-21 NOTE — Patient Instructions (Signed)
Medication Instructions:  Your physician recommends that you continue on your current medications as directed. Please refer to the Current Medication list given to you today.   Labwork: TODAY: BMET  Testing/Procedures: None  Follow-Up: Your physician wants you to follow-up in: 1 year with Dr. Turner. You will receive a reminder letter in the mail two months in advance. If you don't receive a letter, please call our office to schedule the follow-up appointment.   Any Other Special Instructions Will Be Listed Below (If Applicable).   

## 2015-03-21 NOTE — Progress Notes (Signed)
Cardiology Office Note   Date:  03/21/2015   ID:  Alec Hunt, DOB 12-19-1950, MRN [id number]  PCP:  Gavin Pound, MD    Chief Complaint  Patient presents with  . OSA      History of Present Illness: Alec Hunt is a 64 y.o. male with a history of PAF, OSA on BiPAP, HTN and morbid obesity who presents today for followup. He is doing well. He denies any chest pain, SOB, DOE, LE edema, palpitations, dizziness or syncope. He tolerates his BiPAP and feels the pressure is adequate. He feels rested in the am and has no daytime sleepiness. He tolerates the full face mask without any problems. He has no nasal or mouth dryness. He occasionally has some sinus congestion but that is from allergies.    Past Medical History  Diagnosis Date  . Atrial fibrillation   . Arthritis     kness & shoulders  . PAF (paroxysmal atrial fibrillation)   . Obesity   . Syncope     history of in the setting of afib  . OSA (obstructive sleep apnea)     Severe w AHI 81.28/hr now on Bipap  . Hypertension   . Dysrhythmia     Past Surgical History  Procedure Laterality Date  . Knee arthroscopic    . Colonoscopy    . Right rotator cuff surgery       2015  . Cardiac catheterization    . Lumbar laminectomy/decompression microdiscectomy N/A 09/27/2014    Procedure: LUMBAR LAMINECTOMY/DECOMPRESSION MICRODISCECTOMY 3 LEVELS L4-5 L3-4 POSSIBLE L2-3 BILATERAL FORAMINOTOMIES AT L-3,4 AND 5;  Surgeon: Susa Day, MD;  Location: [facility];  Service: Orthopedics;  Laterality: N/A;     Current Outpatient Prescriptions  Medication Sig Dispense Refill  . diltiazem (CARDIZEM CD) 180 MG 24 hr capsule Take 1 capsule (180 mg total) by mouth daily. 90 capsule 2  . HYDROcodone-acetaminophen (NORCO) 7.5-325 MG per tablet Take 1 tablet by mouth 2 (two) times daily as needed. for pain  0  . losartan (COZAAR) 100 MG tablet Take 1 tablet (100 mg total) by mouth every morning. 90 tablet 2  .  methocarbamol (ROBAXIN) 500 MG tablet Take 500 mg by mouth every 8 (eight) hours as needed.  1  . nebivolol (BYSTOLIC) 5 MG tablet Take 0.5 tablets (2.5 mg total) by mouth every evening. 45 tablet 2  . warfarin (COUMADIN) 5 MG tablet Take as directed by Coumadin clinic 60 tablet 3   No current facility-administered medications for this visit.    Allergies:   Review of patient's allergies indicates no known allergies.    Social History:  The patient  reports that he quit smoking about 15 years ago. His smoking use included Cigarettes. He has a 3.75 pack-year smoking history. He has never used smokeless tobacco. He reports that he drinks alcohol. He reports that he does not use illicit drugs.   Family History:  The patient's family history includes CAD in his mother; Heart attack in his mother; Heart disease in his mother; Hypertension in his brother.    ROS:  Please see the history of present illness.   Otherwise, review of systems are positive for none.   All other systems are reviewed and negative.    PHYSICAL EXAM: VS:  BP 134/78 mmHg  Pulse 63  Ht 6' 1.5" (1.867 m)  Wt 331 lb 12.8 oz (150.503  kg)  BMI 43.18 kg/m2  SpO2 96% , BMI Body mass index is 43.18 kg/(m^2). GEN: Well nourished, well developed, in no acute distress HEENT: normal Neck: no JVD, carotid bruits, or masses Cardiac: RRR; no murmurs, rubs, or gallops,no edema  Respiratory:  clear to auscultation bilaterally, normal work of breathing GI: soft, nontender, nondistended, + BS MS: no deformity or atrophy Skin: warm and dry, no rash Neuro:  Strength and sensation are intact Psych: euthymic mood, full affect   EKG:  EKG is not ordered today.    Recent Labs: 09/28/2014: Hemoglobin 12.6*; Platelets 230 09/29/2014: BUN 13; Creatinine, Ser 1.08; Potassium 3.7; Sodium 137    Lipid Panel No results found for: CHOL, TRIG, HDL, CHOLHDL, VLDL, LDLCALC, LDLDIRECT    Wt Readings from Last 3 Encounters:  03/21/15 331 lb  12.8 oz (150.503 kg)  09/27/14 349 lb (158.305 kg)  09/21/14 349 lb 4 oz (158.419 kg)    ASSESSMENT AND PLAN:  1. OSA on BiPAP and tolerating well. I will get a download from his DME. 2. HTN well controlled - continue Diltiazem/losartan/Bystolic - check BMET today  3. PAF maintaining NSR - continue Diltiazem/warfarin /BB 4. Obesity - he has lost 15lbs since I saw him last after changing his diet and I congratulated. 5. Chronic systemic anticoagulation   Current medicines are reviewed at length with the patient today.  The patient does not have concerns regarding medicines.  The following changes have been made:  no change  Labs/ tests ordered today: See above Assessment and Plan No orders of the defined types were placed in this encounter.     Disposition:   FU with me in 1 year  Signed, Sueanne Margarita, MD  03/21/2015 8:52 AM    [facility] [place], [place], [place]  [address] Phone: [phone]; Fax: [phone]

## 2015-03-30 ENCOUNTER — Encounter: Payer: Self-pay | Admitting: Cardiology

## 2015-04-25 ENCOUNTER — Ambulatory Visit (INDEPENDENT_AMBULATORY_CARE_PROVIDER_SITE_OTHER): Payer: 59 | Admitting: *Deleted

## 2015-04-25 DIAGNOSIS — I4891 Unspecified atrial fibrillation: Secondary | ICD-10-CM

## 2015-04-25 LAB — POCT INR: INR: 1.5

## 2015-04-29 ENCOUNTER — Other Ambulatory Visit: Payer: Self-pay | Admitting: Cardiology

## 2015-05-23 ENCOUNTER — Ambulatory Visit (INDEPENDENT_AMBULATORY_CARE_PROVIDER_SITE_OTHER): Payer: 59 | Admitting: *Deleted

## 2015-05-23 DIAGNOSIS — I4891 Unspecified atrial fibrillation: Secondary | ICD-10-CM

## 2015-05-23 LAB — POCT INR: INR: 2.6

## 2015-07-04 ENCOUNTER — Other Ambulatory Visit: Payer: Self-pay | Admitting: *Deleted

## 2015-07-04 NOTE — Telephone Encounter (Signed)
nebivolol (BYSTOLIC) 5 MG tablet  Medication   Date: 10/06/2014  Department: [address] Office  Ordering/Authorizing: Sueanne Margarita, MD      Order Providers    Prescribing Provider Encounter Provider   Sueanne Margarita, MD Garret Reddish, CMA    Medication Detail      Disp Refills Start End     nebivolol (BYSTOLIC) 5 MG tablet 45 tablet 2 10/06/2014     Sig - Route: Take 0.5 tablets (2.5 mg total) by mouth every evening. - Oral    E-Prescribing Status: Receipt confirmed by pharmacy (10/06/2014 1:54 PM EDT)     Pharmacy    [id number] [place], [place]        Additional Information    AVS Reports     Date/Time Report Action User    03/21/2015 9:07 AM After Visit Summary Printed Theodoro Parma, RN      Patient Instructions     Medication Instructions:  Your physician recommends that you continue on your current medications as directed. Please refer to the Current Medication list given to you today.   Labwork: TODAY: BMET  Testing/Procedures: None  Follow-Up: Your physician wants you to follow-up in: 1 year with Dr. Radford Pax. You will receive a reminder letter in the mail two months in advance. If you don't receive a letter, please call our office to schedule the follow-up appointment.

## 2015-07-06 ENCOUNTER — Other Ambulatory Visit: Payer: Self-pay | Admitting: Cardiology

## 2015-07-06 ENCOUNTER — Ambulatory Visit (INDEPENDENT_AMBULATORY_CARE_PROVIDER_SITE_OTHER): Payer: Medicare Other

## 2015-07-06 DIAGNOSIS — I480 Paroxysmal atrial fibrillation: Secondary | ICD-10-CM | POA: Diagnosis not present

## 2015-07-06 DIAGNOSIS — I4891 Unspecified atrial fibrillation: Secondary | ICD-10-CM

## 2015-07-06 LAB — POCT INR: INR: 2.1

## 2015-07-06 MED ORDER — NEBIVOLOL HCL 5 MG PO TABS: 2.5000 mg | ORAL_TABLET | Freq: Every evening | ORAL | Status: DC

## 2015-07-21 ENCOUNTER — Other Ambulatory Visit: Payer: Self-pay | Admitting: Cardiology

## 2015-08-09 ENCOUNTER — Ambulatory Visit (INDEPENDENT_AMBULATORY_CARE_PROVIDER_SITE_OTHER): Payer: Medicare Other | Admitting: *Deleted

## 2015-08-09 DIAGNOSIS — I4891 Unspecified atrial fibrillation: Secondary | ICD-10-CM

## 2015-08-09 LAB — POCT INR: INR: 1.8

## 2015-08-11 IMAGING — CR DG LUMBAR SPINE 2-3V
2 series · 2 of 2 positions shown · non-contrast
Comparison: CT myelogram 09/11/2014

CLINICAL DATA: Lumbar stenosis, preop planning

EXAM:
LUMBAR SPINE - 2-3 VIEW

[w l-spine a.p. *]
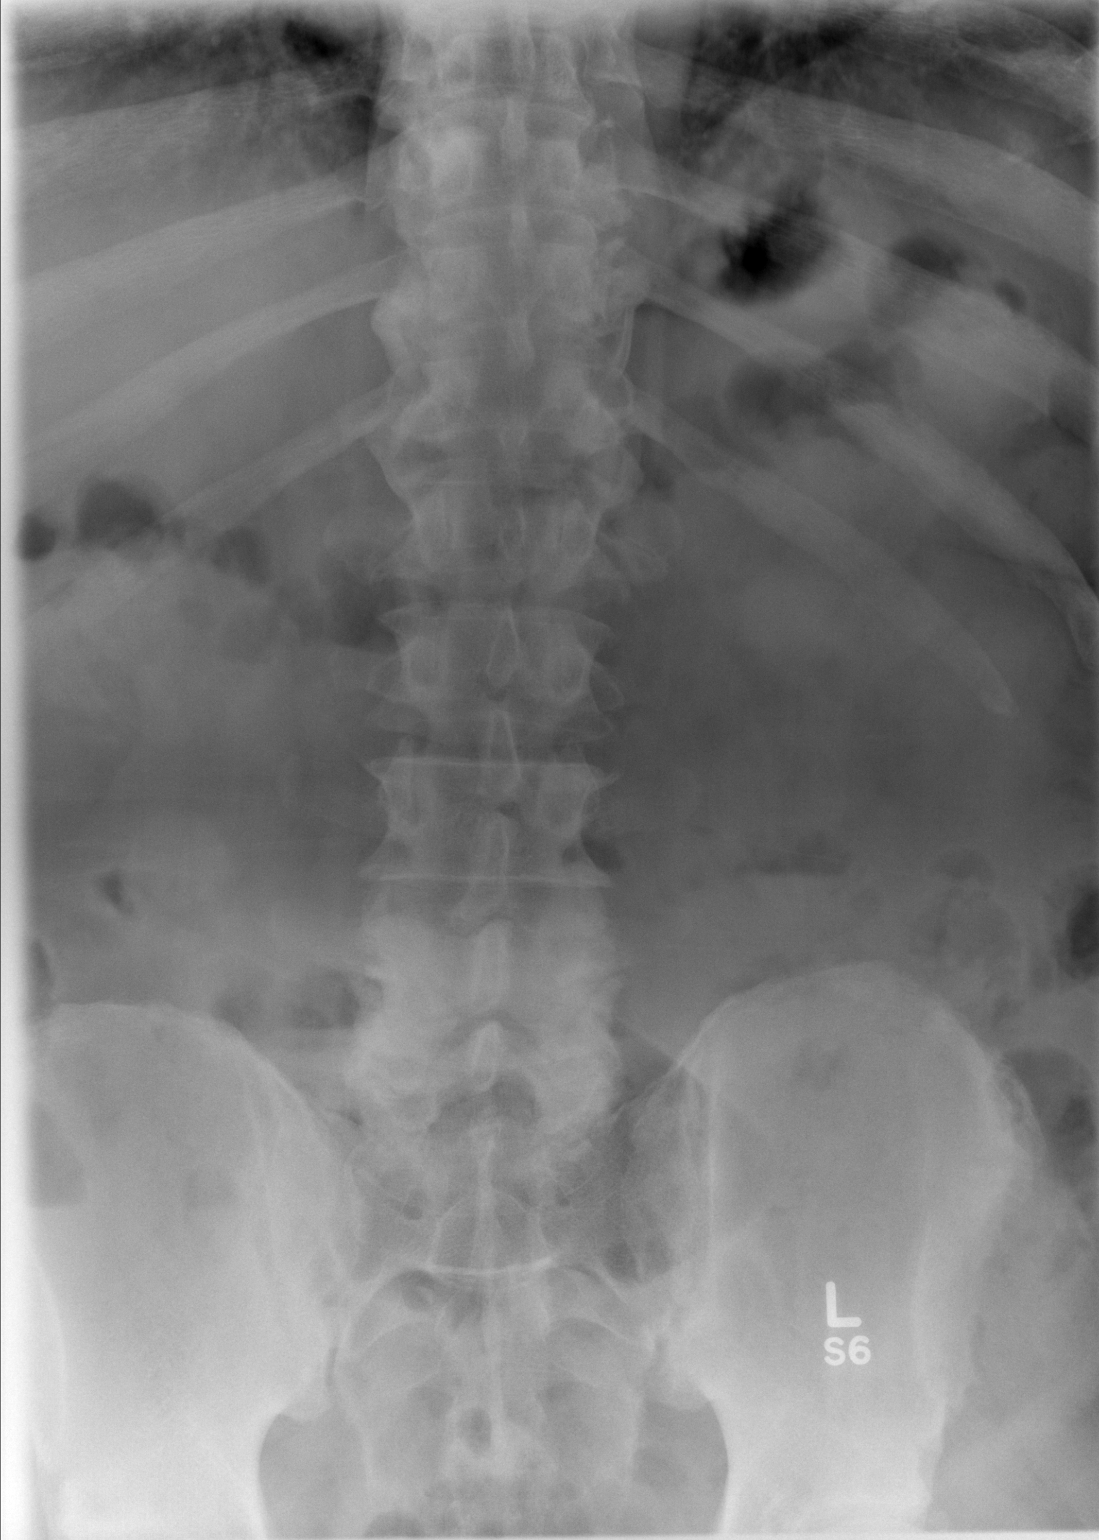

[w l-spine lat *]
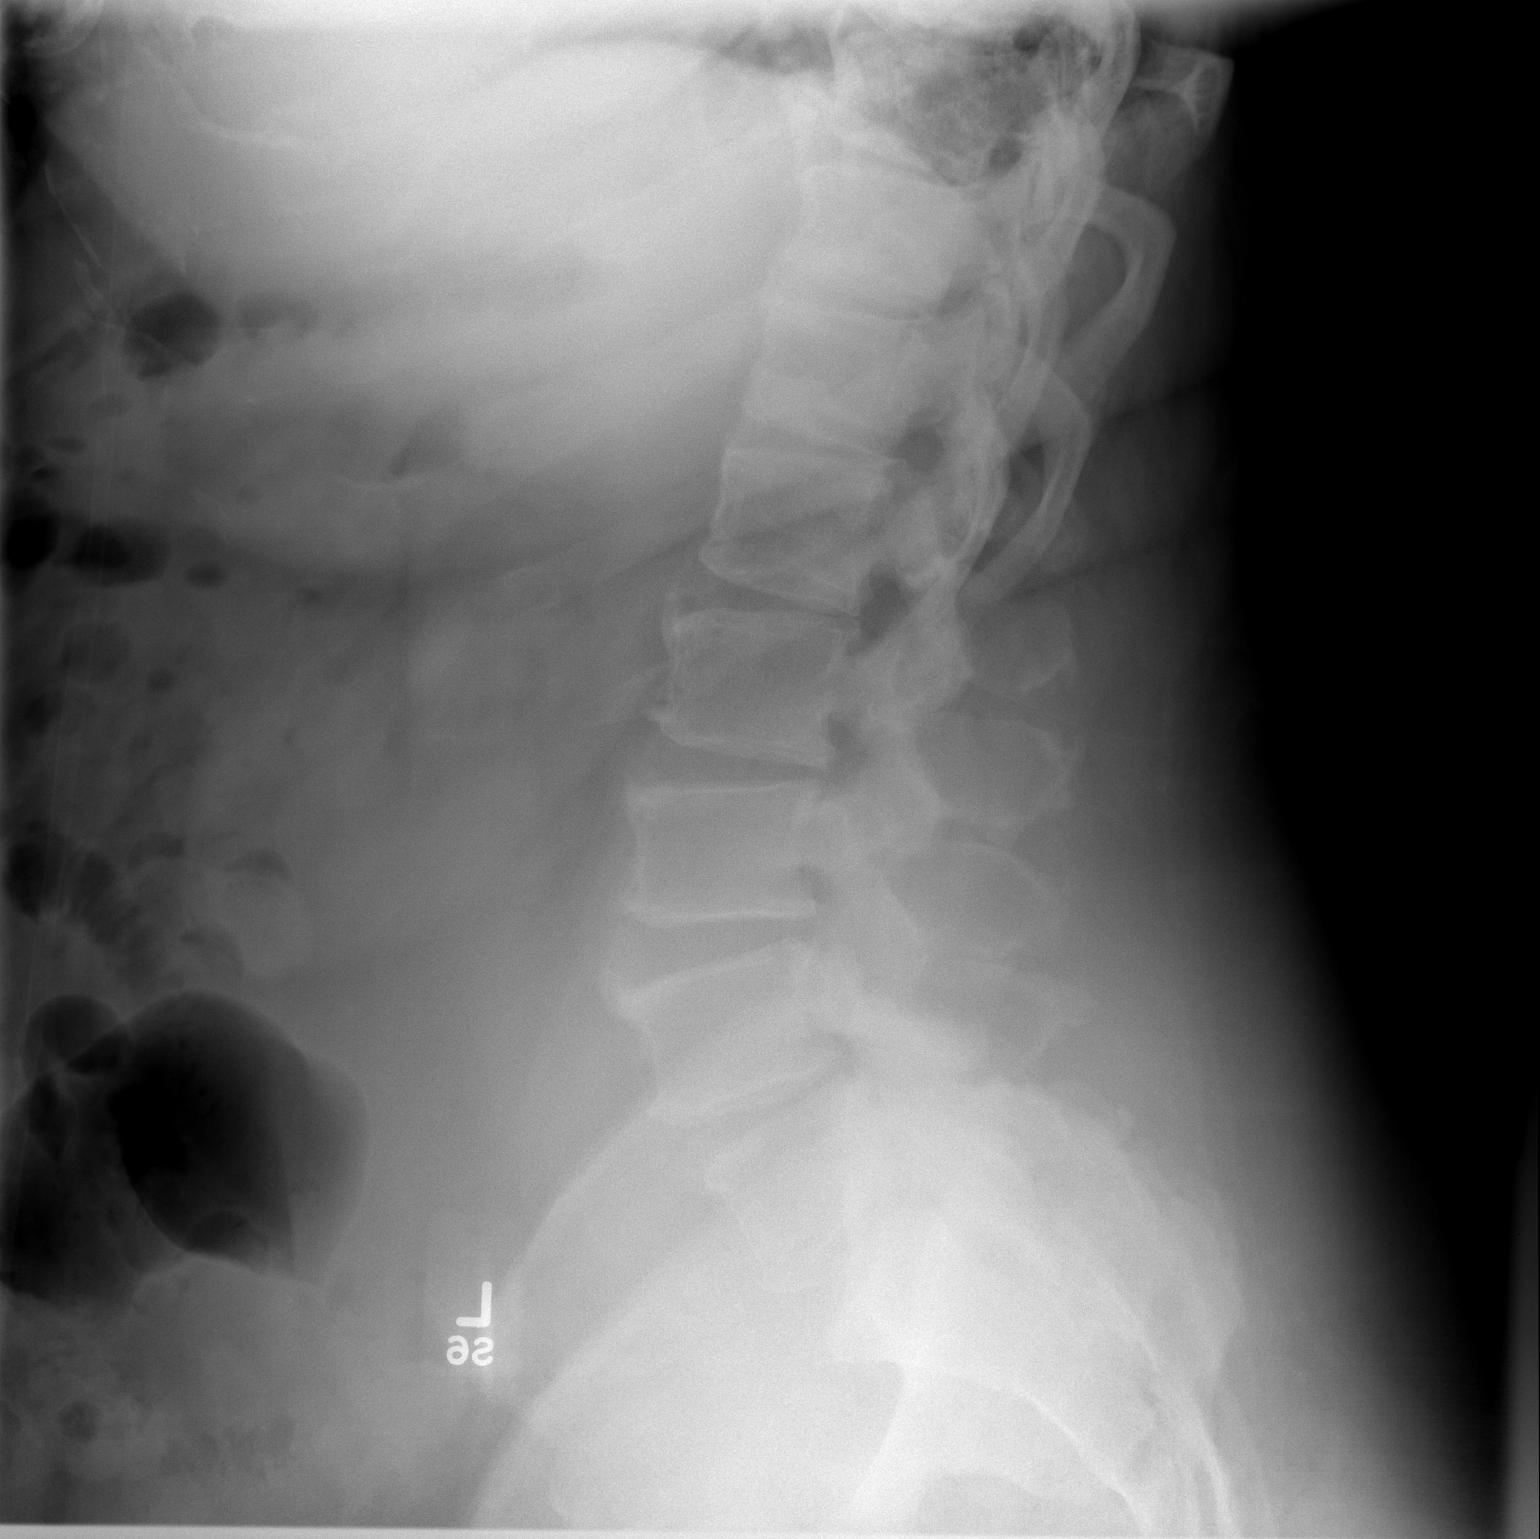

[2 of 2 positions shown; findings below may reference images not displayed]

FINDINGS: Numbering scheme reflects that used on previous study. There is no
evidence of lumbar spine fracture. Alignment is normal. Mild
narrowing of the L4-5 interspace. Sclerosis and DJD involving
bilateral facet joints L4-5 and L5-S1.
IMPRESSION: 1. Facet DJD L4-S1.

## 2015-08-30 ENCOUNTER — Ambulatory Visit (INDEPENDENT_AMBULATORY_CARE_PROVIDER_SITE_OTHER): Payer: Medicare Other | Admitting: *Deleted

## 2015-08-30 DIAGNOSIS — I4891 Unspecified atrial fibrillation: Secondary | ICD-10-CM

## 2015-08-30 LAB — POCT INR: INR: 2.3

## 2015-09-27 ENCOUNTER — Ambulatory Visit (INDEPENDENT_AMBULATORY_CARE_PROVIDER_SITE_OTHER): Payer: Medicare Other | Admitting: *Deleted

## 2015-09-27 DIAGNOSIS — I4891 Unspecified atrial fibrillation: Secondary | ICD-10-CM | POA: Diagnosis not present

## 2015-09-27 LAB — POCT INR: INR: 2.6

## 2015-09-30 ENCOUNTER — Other Ambulatory Visit: Payer: Self-pay | Admitting: Cardiology

## 2015-10-04 ENCOUNTER — Other Ambulatory Visit: Payer: Self-pay | Admitting: Cardiology

## 2015-10-04 ENCOUNTER — Telehealth: Payer: Self-pay | Admitting: *Deleted

## 2015-10-04 NOTE — Telephone Encounter (Signed)
Approved      Disp Refills Start End    losartan (COZAAR) 100 MG tablet 90 tablet 1 10/02/2015     Sig:  TAKE 1 TABLET BY MOUTH EVERY MORNING.    Class:  Normal    DAW:  No    Authorizing Provider:  Sueanne Margarita, MD

## 2015-10-04 NOTE — Telephone Encounter (Signed)
Patient called and stated that he is unable to afford the rx for bystolic as his oop cost for a three month supply is $160. He was told by the pharmacy that his rx needs to be written for the generic. To my knowledge the medication is not yet available in generic. I called the pharmacy to follow up on this and was informed that it is not available in generic. Please advise as to what patient can take to replace this medication. Thanks, MI

## 2015-10-05 NOTE — Telephone Encounter (Signed)
Change to metoprolol tartrate 25mg  BID.  Have him check his BP daily for a week on new med and call with results

## 2015-10-08 MED ORDER — METOPROLOL TARTRATE 25 MG PO TABS: 25.0000 mg | ORAL_TABLET | Freq: Two times a day (BID) | ORAL | Status: DC

## 2015-10-08 NOTE — Telephone Encounter (Signed)
Instructed patient to STOP BYSTOLIC and START METOPROLOL 25 mg BID. Patient has about 1 week left of Bystolic. He understands to check BP daily once he changes medications and call with results. Patient was grateful for call.

## 2015-10-17 ENCOUNTER — Other Ambulatory Visit: Payer: Self-pay | Admitting: Cardiology

## 2015-10-25 ENCOUNTER — Ambulatory Visit (INDEPENDENT_AMBULATORY_CARE_PROVIDER_SITE_OTHER): Payer: Medicare Other | Admitting: Pharmacist

## 2015-10-25 DIAGNOSIS — I4891 Unspecified atrial fibrillation: Secondary | ICD-10-CM

## 2015-10-25 LAB — POCT INR: INR: 2

## 2015-12-01 ENCOUNTER — Other Ambulatory Visit: Payer: Self-pay | Admitting: Cardiology

## 2015-12-06 ENCOUNTER — Ambulatory Visit (INDEPENDENT_AMBULATORY_CARE_PROVIDER_SITE_OTHER): Payer: Medicare Other | Admitting: *Deleted

## 2015-12-06 DIAGNOSIS — I4891 Unspecified atrial fibrillation: Secondary | ICD-10-CM | POA: Diagnosis not present

## 2015-12-06 LAB — POCT INR: INR: 1.7

## 2015-12-20 ENCOUNTER — Ambulatory Visit (INDEPENDENT_AMBULATORY_CARE_PROVIDER_SITE_OTHER): Payer: Medicare Other | Admitting: *Deleted

## 2015-12-20 DIAGNOSIS — I4891 Unspecified atrial fibrillation: Secondary | ICD-10-CM

## 2015-12-20 LAB — POCT INR: INR: 2.9

## 2016-01-03 ENCOUNTER — Encounter: Payer: Self-pay | Admitting: Cardiology

## 2016-01-17 ENCOUNTER — Encounter: Payer: Self-pay | Admitting: Cardiology

## 2016-01-31 ENCOUNTER — Ambulatory Visit (INDEPENDENT_AMBULATORY_CARE_PROVIDER_SITE_OTHER): Payer: Medicare Other

## 2016-01-31 DIAGNOSIS — I4891 Unspecified atrial fibrillation: Secondary | ICD-10-CM | POA: Diagnosis not present

## 2016-01-31 LAB — POCT INR: INR: 2.4

## 2016-02-29 ENCOUNTER — Encounter: Payer: Self-pay | Admitting: Cardiology

## 2016-02-29 ENCOUNTER — Ambulatory Visit: Payer: Self-pay | Admitting: Cardiology

## 2016-03-04 ENCOUNTER — Encounter: Payer: Self-pay | Admitting: Cardiology

## 2016-03-16 ENCOUNTER — Encounter: Payer: Self-pay | Admitting: Cardiology

## 2016-03-19 ENCOUNTER — Encounter: Payer: Self-pay | Admitting: Cardiology

## 2016-03-19 ENCOUNTER — Encounter (INDEPENDENT_AMBULATORY_CARE_PROVIDER_SITE_OTHER): Payer: Self-pay

## 2016-03-19 ENCOUNTER — Ambulatory Visit (INDEPENDENT_AMBULATORY_CARE_PROVIDER_SITE_OTHER): Payer: Medicare Other | Admitting: *Deleted

## 2016-03-19 ENCOUNTER — Ambulatory Visit (INDEPENDENT_AMBULATORY_CARE_PROVIDER_SITE_OTHER): Payer: Medicare Other | Admitting: Cardiology

## 2016-03-19 VITALS — BP 124/78 | HR 54 | Ht 73.5 in | Wt 349.0 lb

## 2016-03-19 DIAGNOSIS — G4733 Obstructive sleep apnea (adult) (pediatric): Secondary | ICD-10-CM

## 2016-03-19 DIAGNOSIS — I10 Essential (primary) hypertension: Secondary | ICD-10-CM | POA: Diagnosis not present

## 2016-03-19 DIAGNOSIS — I4891 Unspecified atrial fibrillation: Secondary | ICD-10-CM | POA: Diagnosis not present

## 2016-03-19 DIAGNOSIS — I480 Paroxysmal atrial fibrillation: Secondary | ICD-10-CM | POA: Diagnosis not present

## 2016-03-19 DIAGNOSIS — R001 Bradycardia, unspecified: Secondary | ICD-10-CM

## 2016-03-19 LAB — CBC WITH DIFFERENTIAL/PLATELET
Basophils Absolute: 0 cells/uL (ref 0–200)
Basophils Relative: 0 %
EOS PCT: 9 %
Eosinophils Absolute: 603 cells/uL — ABNORMAL HIGH (ref 15–500)
HCT: 40 % (ref 38.5–50.0)
HEMOGLOBIN: 13.6 g/dL (ref 13.2–17.1)
LYMPHS ABS: 2144 {cells}/uL (ref 850–3900)
LYMPHS PCT: 32 %
MCH: 29.8 pg (ref 27.0–33.0)
MCHC: 34 g/dL (ref 32.0–36.0)
MCV: 87.5 fL (ref 80.0–100.0)
MPV: 9.4 fL (ref 7.5–12.5)
Monocytes Absolute: 402 cells/uL (ref 200–950)
Monocytes Relative: 6 %
NEUTROS PCT: 53 %
Neutro Abs: 3551 cells/uL (ref 1500–7800)
PLATELETS: 248 10*3/uL (ref 140–400)
RBC: 4.57 MIL/uL (ref 4.20–5.80)
RDW: 13 % (ref 11.0–15.0)
WBC: 6.7 10*3/uL (ref 3.8–10.8)

## 2016-03-19 LAB — BASIC METABOLIC PANEL
BUN: 15 mg/dL (ref 7–25)
CO2: 23 mmol/L (ref 20–31)
Calcium: 8.8 mg/dL (ref 8.6–10.3)
Chloride: 107 mmol/L (ref 98–110)
Creat: 1 mg/dL (ref 0.70–1.25)
Glucose, Bld: 114 mg/dL — ABNORMAL HIGH (ref 65–99)
POTASSIUM: 4 mmol/L (ref 3.5–5.3)
SODIUM: 139 mmol/L (ref 135–146)

## 2016-03-19 LAB — POCT INR: INR: 3

## 2016-03-19 NOTE — Progress Notes (Signed)
Cardiology Office Note    Date:  03/19/2016   ID:  Alec Hunt, DOB 1950-10-26, MRN [id number]  PCP:  Gavin Pound, MD  Cardiologist:  Fransico Him, MD   Chief Complaint  Patient presents with  . Atrial Fibrillation  . Hypertension  . Sleep Apnea    History of Present Illness:  Alec Hunt is a 65 y.o. male with a history of PAF, OSA on BiPAP, HTN and morbid obesity who presents today for followup. He is doing well. He denies any chest pain, SOB, DOE, palpitations, dizziness or syncope. Rarely he will have some right ankle edema.  He tolerates his BiPAP and feels the pressure is adequate. He feels rested in the am and has no daytime sleepiness. He tolerates the full face mask without any problems. He has no nasal or mouth dryness. He occasionally has some sinus congestion but that is from allergies and change of the season.    Past Medical History:  Diagnosis Date  . Arthritis    kness & shoulders  . Atrial fibrillation ([place])   . Dysrhythmia   . Hypertension   . Obesity   . OSA (obstructive sleep apnea)    Severe w AHI 81.28/hr now on Bipap  . PAF (paroxysmal atrial fibrillation) ([place])   . Syncope    history of in the setting of afib    Past Surgical History:  Procedure Laterality Date  . CARDIAC CATHETERIZATION    . COLONOSCOPY    . knee arthroscopic    . LUMBAR LAMINECTOMY/DECOMPRESSION MICRODISCECTOMY N/A 09/27/2014   Procedure: LUMBAR LAMINECTOMY/DECOMPRESSION MICRODISCECTOMY 3 LEVELS L4-5 L3-4 POSSIBLE L2-3 BILATERAL FORAMINOTOMIES AT L-3,4 AND 5;  Surgeon: Susa Day, MD;  Location: [facility];  Service: Orthopedics;  Laterality: N/A;  . right rotator cuff surgery      2015    Current Medications: Outpatient Medications Prior to Visit  Medication Sig Dispense Refill  . diltiazem (CARDIZEM CD) 180 MG 24 hr capsule TAKE 1 CAPSULE (180 MG TOTAL) BY MOUTH DAILY. 90 capsule 1  . HYDROcodone-acetaminophen (NORCO) 7.5-325 MG per tablet Take 1 tablet by  mouth 2 (two) times daily as needed. for pain  0  . losartan (COZAAR) 100 MG tablet TAKE 1 TABLET BY MOUTH EVERY MORNING. 90 tablet 1  . methocarbamol (ROBAXIN) 500 MG tablet Take 500 mg by mouth every 8 (eight) hours as needed.  1  . metoprolol tartrate (LOPRESSOR) 25 MG tablet Take 1 tablet (25 mg total) by mouth 2 (two) times daily. 60 tablet 11  . warfarin (COUMADIN) 5 MG tablet TAKE AS DIRECTED BY COUMADIN CLINIC 60 tablet 3   No facility-administered medications prior to visit.      Allergies:   Review of patient's allergies indicates no known allergies.   Social History   Social History  . Marital status: Married    Spouse name: N/A  . Number of children: N/A  . Years of education: N/A   Social History Main Topics  . Smoking status: Former Smoker    Packs/day: 0.25    Years: 15.00    Types: Cigarettes    Quit date: 02/03/2000  . Smokeless tobacco: Never Used  . Alcohol use Yes     Comment: hx of ETOH use quit at age 26 was drinking beer  . Drug use: No  . Sexual activity: Not Asked   Other Topics Concern  . None   Social History Narrative  . None     Family History:  The patient's family history includes CAD in his mother; Heart attack in his mother; Heart disease in his mother; Hypertension in his brother.   ROS:   Please see the history of present illness.    ROS All other systems reviewed and are negative.  No flowsheet data found.     PHYSICAL EXAM:   VS:  BP 124/78   Pulse (!) 54   Ht 6' 1.5" (1.867 m)   Wt (!) 349 lb (158.3 kg)   SpO2 98%   BMI 45.42 kg/m    GEN: Well nourished, well developed, in no acute distress  HEENT: normal  Neck: no JVD, carotid bruits, or masses Cardiac: RRR; no murmurs, rubs, or gallops,no edema.  Intact distal pulses bilaterally.  Respiratory:  clear to auscultation bilaterally, normal work of breathing GI: soft, nontender, nondistended, + BS MS: no deformity or atrophy  Skin: warm and dry, no rash Neuro:  Alert  and Oriented x 3, Strength and sensation are intact Psych: euthymic mood, full affect  Wt Readings from Last 3 Encounters:  03/19/16 (!) 349 lb (158.3 kg)  03/21/15 (!) 331 lb 12.8 oz (150.5 kg)  09/27/14 (!) 349 lb (158.3 kg)      Studies/Labs Reviewed:   EKG:  EKG is ordered today and showed atrial fibrillation with SVR and PVC with RBBB  Recent Labs: 03/21/2015: BUN 15; Creatinine, Ser 0.99; Potassium 3.9; Sodium 139   Lipid Panel No results found for: CHOL, TRIG, HDL, CHOLHDL, VLDL, LDLCALC, LDLDIRECT  Additional studies/ records that were reviewed today include:  none    ASSESSMENT:    1. Paroxysmal atrial fibrillation (HCC)   2. Essential hypertension   3. OSA (obstructive sleep apnea)   4. Morbid obesity, unspecified obesity type ([place])   5. Bradycardia      PLAN:  In order of problems listed above:  1.  PAF - EKG today shows that he is in atrial fibrillation with SVR at 53bpm with occasional PVC and RBBB.  We discussed rate vs. Rhythm control. He is completely asymptomatic and does not want to try to get back in rhythm at this time.  Continue Cardizem/BB and warfarin. 2.  HTN - BP controlled on current meds.  Continue CCB/BB and ARB.  Check BMET. 3.  OSA - the patient is tolerating PAP therapy well without any problems. The patient has been using and benefiting from CPAP use and will continue to benefit from therapy. I will get a download from the DME 4.  Morbid obesity - I have encouraged him to get into a routine exercise program and cut back on carbs and portions.  5.  Bradycardia - asymptomatic     Medication Adjustments/Labs and Tests Ordered: Current medicines are reviewed at length with the patient today.  Concerns regarding medicines are outlined above.  Medication changes, Labs and Tests ordered today are listed in the Patient Instructions below.  There are no Patient Instructions on file for this visit.   Signed, Fransico Him, MD  03/19/2016 10:01  AM    [facility] [place], [place], [place]  [address] Phone: [phone]; Fax: [phone]

## 2016-03-19 NOTE — Patient Instructions (Addendum)
Your physician recommends that you continue on your current medications as directed. Please refer to the Current Medication list given to you today.  Your physician recommends that you return for lab work in: [place] wants you to follow-up in: [place] will receive a reminder letter in the mail two months in advance. If you don't receive a letter, please call our office to schedule the follow-up appointment.

## 2016-03-26 ENCOUNTER — Other Ambulatory Visit: Payer: Self-pay | Admitting: Cardiology

## 2016-04-12 ENCOUNTER — Other Ambulatory Visit: Payer: Self-pay | Admitting: Cardiology

## 2016-04-14 ENCOUNTER — Other Ambulatory Visit: Payer: Self-pay | Admitting: *Deleted

## 2016-04-14 MED ORDER — WARFARIN SODIUM 5 MG PO TABS: ORAL_TABLET | ORAL | 3 refills | Status: DC

## 2016-04-30 ENCOUNTER — Encounter (INDEPENDENT_AMBULATORY_CARE_PROVIDER_SITE_OTHER): Payer: Self-pay

## 2016-04-30 ENCOUNTER — Ambulatory Visit (INDEPENDENT_AMBULATORY_CARE_PROVIDER_SITE_OTHER): Payer: Medicare Other | Admitting: *Deleted

## 2016-04-30 DIAGNOSIS — I4891 Unspecified atrial fibrillation: Secondary | ICD-10-CM

## 2016-04-30 LAB — POCT INR: INR: 2.6

## 2016-06-11 ENCOUNTER — Ambulatory Visit (INDEPENDENT_AMBULATORY_CARE_PROVIDER_SITE_OTHER): Payer: Medicare Other | Admitting: *Deleted

## 2016-06-11 DIAGNOSIS — I4891 Unspecified atrial fibrillation: Secondary | ICD-10-CM

## 2016-06-11 LAB — POCT INR: INR: 3.9

## 2016-06-18 ENCOUNTER — Other Ambulatory Visit: Payer: Self-pay | Admitting: *Deleted

## 2016-06-18 NOTE — Telephone Encounter (Signed)
Pharmacy requests a ninety day rx. 

## 2016-06-19 MED ORDER — WARFARIN SODIUM 5 MG PO TABS: ORAL_TABLET | ORAL | 3 refills | Status: DC

## 2016-06-20 ENCOUNTER — Other Ambulatory Visit: Payer: Self-pay | Admitting: *Deleted

## 2016-06-20 MED ORDER — WARFARIN SODIUM 5 MG PO TABS: ORAL_TABLET | ORAL | 1 refills | Status: DC

## 2016-06-20 NOTE — Telephone Encounter (Signed)
Received fax from [facility] requesting a ninety day rx.

## 2016-07-09 ENCOUNTER — Ambulatory Visit (INDEPENDENT_AMBULATORY_CARE_PROVIDER_SITE_OTHER): Payer: Medicare Other | Admitting: *Deleted

## 2016-07-09 DIAGNOSIS — I4891 Unspecified atrial fibrillation: Secondary | ICD-10-CM | POA: Diagnosis not present

## 2016-07-09 LAB — POCT INR: INR: 2.7

## 2016-07-20 ENCOUNTER — Other Ambulatory Visit: Payer: Self-pay | Admitting: Cardiology

## 2016-07-22 NOTE — Telephone Encounter (Signed)
Medication Detail    Disp Refills Start End   metoprolol tartrate (LOPRESSOR) 25 MG tablet 60 tablet 11 10/08/2015    Sig - Route: Take 1 tablet (25 mg total) by mouth 2 (two) times daily. - Oral   E-Prescribing Status: Receipt confirmed by pharmacy (10/08/2015 8:40 AM EDT)   Ph[facility]acy   [id number] [place], [place]

## 2016-08-06 ENCOUNTER — Ambulatory Visit (INDEPENDENT_AMBULATORY_CARE_PROVIDER_SITE_OTHER): Payer: Medicare Other | Admitting: *Deleted

## 2016-08-06 DIAGNOSIS — I4891 Unspecified atrial fibrillation: Secondary | ICD-10-CM

## 2016-08-06 LAB — POCT INR: INR: 2.5

## 2016-09-10 ENCOUNTER — Ambulatory Visit (INDEPENDENT_AMBULATORY_CARE_PROVIDER_SITE_OTHER): Payer: Medicare Other | Admitting: *Deleted

## 2016-09-10 DIAGNOSIS — I4891 Unspecified atrial fibrillation: Secondary | ICD-10-CM

## 2016-09-10 LAB — POCT INR: INR: 2.8

## 2016-09-19 ENCOUNTER — Other Ambulatory Visit: Payer: Self-pay | Admitting: Cardiology

## 2016-10-22 ENCOUNTER — Encounter (INDEPENDENT_AMBULATORY_CARE_PROVIDER_SITE_OTHER): Payer: Self-pay

## 2016-10-22 ENCOUNTER — Ambulatory Visit (INDEPENDENT_AMBULATORY_CARE_PROVIDER_SITE_OTHER): Payer: Medicare Other | Admitting: *Deleted

## 2016-10-22 DIAGNOSIS — I4891 Unspecified atrial fibrillation: Secondary | ICD-10-CM

## 2016-10-22 LAB — POCT INR: INR: 2.8

## 2016-12-03 ENCOUNTER — Ambulatory Visit (INDEPENDENT_AMBULATORY_CARE_PROVIDER_SITE_OTHER): Payer: Medicare Other | Admitting: *Deleted

## 2016-12-03 DIAGNOSIS — I4891 Unspecified atrial fibrillation: Secondary | ICD-10-CM | POA: Diagnosis not present

## 2016-12-03 LAB — POCT INR: INR: 3.3

## 2016-12-16 ENCOUNTER — Ambulatory Visit (INDEPENDENT_AMBULATORY_CARE_PROVIDER_SITE_OTHER): Payer: Medicare Other | Admitting: *Deleted

## 2016-12-16 DIAGNOSIS — I4891 Unspecified atrial fibrillation: Secondary | ICD-10-CM

## 2016-12-16 LAB — POCT INR: INR: 2.7

## 2017-01-13 ENCOUNTER — Encounter (INDEPENDENT_AMBULATORY_CARE_PROVIDER_SITE_OTHER): Payer: Self-pay

## 2017-01-13 ENCOUNTER — Ambulatory Visit (INDEPENDENT_AMBULATORY_CARE_PROVIDER_SITE_OTHER): Payer: Medicare Other | Admitting: *Deleted

## 2017-01-13 DIAGNOSIS — Z5181 Encounter for therapeutic drug level monitoring: Secondary | ICD-10-CM

## 2017-01-13 DIAGNOSIS — I4891 Unspecified atrial fibrillation: Secondary | ICD-10-CM

## 2017-01-13 LAB — POCT INR: INR: 2.8

## 2017-01-26 ENCOUNTER — Encounter: Payer: Self-pay | Admitting: Cardiology

## 2017-01-30 ENCOUNTER — Telehealth: Payer: Self-pay | Admitting: *Deleted

## 2017-01-30 NOTE — Telephone Encounter (Signed)
Informed patient of compliance results and patient understanding was verbalized. Patient understands his settings will not change. Patient was grateful for the call and thanked me.  

## 2017-01-30 NOTE — Telephone Encounter (Signed)
-----   Message from Sueanne Margarita, MD sent at 01/28/2017 12:42 PM EDT ----- Good AHI and compliance.  Continue current PAP settings.

## 2017-02-10 ENCOUNTER — Ambulatory Visit (INDEPENDENT_AMBULATORY_CARE_PROVIDER_SITE_OTHER): Payer: Medicare Other | Admitting: *Deleted

## 2017-02-10 DIAGNOSIS — Z5181 Encounter for therapeutic drug level monitoring: Secondary | ICD-10-CM | POA: Diagnosis not present

## 2017-02-10 DIAGNOSIS — I4891 Unspecified atrial fibrillation: Secondary | ICD-10-CM

## 2017-02-10 LAB — POCT INR: INR: 3.5

## 2017-03-03 ENCOUNTER — Ambulatory Visit (INDEPENDENT_AMBULATORY_CARE_PROVIDER_SITE_OTHER): Payer: Medicare Other | Admitting: *Deleted

## 2017-03-03 ENCOUNTER — Encounter (INDEPENDENT_AMBULATORY_CARE_PROVIDER_SITE_OTHER): Payer: Self-pay

## 2017-03-03 DIAGNOSIS — I4891 Unspecified atrial fibrillation: Secondary | ICD-10-CM | POA: Diagnosis not present

## 2017-03-03 DIAGNOSIS — Z5181 Encounter for therapeutic drug level monitoring: Secondary | ICD-10-CM | POA: Diagnosis not present

## 2017-03-03 LAB — POCT INR: INR: 2.6

## 2017-03-31 ENCOUNTER — Other Ambulatory Visit: Payer: Self-pay | Admitting: Cardiology

## 2017-04-15 ENCOUNTER — Encounter: Payer: Self-pay | Admitting: *Deleted

## 2017-04-24 ENCOUNTER — Telehealth: Payer: Self-pay | Admitting: Cardiology

## 2017-04-24 NOTE — Telephone Encounter (Signed)
°  Alec Hunt     [facility] Pre-operative Risk Assessment    Request for surgical clearance:  1. What type of surgery is being performed? Prostate Biopsy  2. When is this surgery scheduled? 06/11/17  3. Are there any medications that need to be held prior to surgery and how long? Warfarin needs to be held 5 days prior.   4. Practice name and name of physician performing surgery? Dr. Stark Hunt performing procedure  5. What is your office phone and fax number? [facility] [phone] Ext: [phone]. Fax Number [phone]  6. Anesthesia type (None, local, MAC, general) ? Local   Alec Hunt 04/24/2017, 4:14 PM  _________________________________________________________________   (provider comments below)

## 2017-04-27 NOTE — Telephone Encounter (Signed)
    Chart reviewed as part of pre-operative protocol coverage. Because of Alec Hunt's past medical history and time since last visit, he/she will require a follow-up visit in order to better assess preoperative cardiovascular risk.  Also needs Coumadin addressed by [facility].  Will route to PharmD as well.   Pre-op covering staff: - Please schedule appointment and call patient to inform them. - Please contact requesting surgeon's office via preferred method (i.e, phone, fax) to inform them of need for appointment prior to surgery.  Richardson Dopp, PA-C  04/27/2017, 5:00 PM

## 2017-04-28 ENCOUNTER — Telehealth: Payer: Self-pay | Admitting: *Deleted

## 2017-04-28 NOTE — Telephone Encounter (Signed)
SPOKE TO PT AND SCHEDULED PT FOR CARDIAC CLEARANCE  WITH EKG  ON  05-19-2017 WITH DAYNA DUNN

## 2017-04-28 NOTE — Telephone Encounter (Signed)
Patient with diagnosis of atrial fibrillation on warfarin for anticoagulation.    Procedure: Prostate Biosy Date of procedure: 06/11/2017  CHADS2-VASc score of  2 (HTN, AGE)  Per office protocol, patient can hold warfarin for 5 days prior to procedure.   Patient will not need bridging with Lovenox (enoxaparin) around procedure.  Patient should restart warfarin on the evening of procedure or day after, at discretion of procedure MD

## 2017-04-28 NOTE — Telephone Encounter (Signed)
PT SCHEDULED ON 05-19-17 FOR CLEARANCE

## 2017-04-28 NOTE — Telephone Encounter (Signed)
LMOVM TO CONTACT OFFICE BACK FOR AN CARDIAC CLEARANCE APPT TO BE SET UP .

## 2017-04-30 ENCOUNTER — Other Ambulatory Visit: Payer: Self-pay | Admitting: Cardiology

## 2017-05-04 ENCOUNTER — Other Ambulatory Visit: Payer: Self-pay | Admitting: Cardiology

## 2017-05-05 ENCOUNTER — Ambulatory Visit (INDEPENDENT_AMBULATORY_CARE_PROVIDER_SITE_OTHER): Payer: Medicare Other | Admitting: *Deleted

## 2017-05-05 ENCOUNTER — Encounter (INDEPENDENT_AMBULATORY_CARE_PROVIDER_SITE_OTHER): Payer: Self-pay

## 2017-05-05 DIAGNOSIS — I4891 Unspecified atrial fibrillation: Secondary | ICD-10-CM

## 2017-05-05 DIAGNOSIS — Z5181 Encounter for therapeutic drug level monitoring: Secondary | ICD-10-CM

## 2017-05-05 LAB — POCT INR: INR: 2.2

## 2017-05-18 ENCOUNTER — Encounter: Payer: Self-pay | Admitting: Physician Assistant

## 2017-05-18 NOTE — Progress Notes (Signed)
Cardiology Office Note    Date:  05/19/2017  ID:  Alec Hunt, DOB 1950-07-03, MRN [id number] PCP:  Gavin Pound, MD  Cardiologist:  Dr. Radford Pax   Chief Complaint: pre-op eval  History of Present Illness:  Alec Hunt is a 66 y.o. male with history of persistent atrial fib with h/o bradycardia, Mobitz 1 heart block, hyperglycemia, OSA on bipap, HTN, morbid obesity, RBBB, h/o syncope in setting of atrial fib who presents for overdue follow-up and pre-operative evaluation.  Last echo 2013 showed normal EF 66.9%, mild LVH, mild left atrial enlargement. Remote stress test in 2013 showed mild perfusion defect due to attenuation, cannot r/o small mild superimposed area of ischemia in basal inferior, mid inferior, and apical inferior regions, EF 61%, "unremarkable pharmacologic stress test, low risk scan." Admission in 10/2012 revealed transient bradycardia with heart rates dipping into the 30s at times and had associated second-degree heart block type I transiently, prompting discontinuation of digoxin. At last OV 02/2016 he was in atrial fib with HR 63bpm with occasional PVC and RBBB. Rate vs rhythm control was discussed. As he was asymptomatic he did not wish to pursue rhythm control. He was continued on warfarin. Last labs 02/2016 showed Hgb 13.6, K 4.0, Cr 1.0, glucose 114.  He returns for pre-operative evaluation for prostate biopsy on 06/11/17 for rising PSA. In general he states he's been feeling very well. No palpitations or dyspnea. He initially denied chest pain then his wife reminded him of an episode about a month ago at rest which he felt some tightness in the middle of his chest lasting approximately 1 minute. It felt like prior acid reflux and resolved completely with a burp. He has been able to exert himself doing yardwork without any symptom or problem since that time. He reports some pre-syncopal feelings over the summer while working out in the yard in the heat, requiring him to  sit down. He also recalls remotely having issues with BP dropping while on fluid pills in the past. He does drink a lot of fluid daily in the form of water and sweet tea. He knows to watch sodium but I'm not convinced he's following this. He's been following his blood pressure at home and sees readings similar to the value today, although it occasionally runs 150-160s by his home cuff. He's compliant with CPAP. No syncope. Asymptomatic today aside from trace LEE particularly on the R which he states is chronic and comes and goes.  Past Medical History:  Diagnosis Date  . Arthritis    kness & shoulders  . Bradycardia   . Hyperglycemia   . Hypertension   . Mobitz type 1 second degree atrioventricular block   . Morbid obesity ([place])   . OSA (obstructive sleep apnea)    Severe w AHI 81.28/hr now on Bipap  . Persistent atrial fibrillation ([place])   . RBBB   . Syncope    history of in the setting of afib    Past Surgical History:  Procedure Laterality Date  . CARDIAC CATHETERIZATION    . CARDIOVERSION N/A 11/05/2012   Performed by Candee Furbish, MD at [facility]    . knee arthroscopic    . LUMBAR LAMINECTOMY/DECOMPRESSION MICRODISCECTOMY 3 LEVELS L4-5 L3-4 POSSIBLE L2-3 BILATERAL FORAMINOTOMIES AT L-3,4 AND 5 N/A 09/27/2014   Performed by Susa Day, MD at [facility]  . right rotator cuff surgery      2015    Current Medications: Current Meds  Medication Sig  . diltiazem (CARDIZEM CD) 180 MG 24 hr capsule TAKE 1 CAPSULE (180 MG TOTAL) BY MOUTH DAILY.  Marland Kitchen losartan (COZAAR) 100 MG tablet TAKE 1 TABLET BY MOUTH EVERY DAY IN THE MORNING  . metoprolol tartrate (LOPRESSOR) 25 MG tablet TAKE 1 TABLET BY MOUTH 2 TIMES DAILY.  Marland Kitchen warfarin (COUMADIN) 5 MG tablet TAKE AS DIRECTED BY COUMADIN CLINIC     Allergies:   Patient has no known allergies.   Social History   Socioeconomic History  . Marital status: Married    Spouse name: None  . Number of children: None  . Years of education:  None  . Highest education level: None  Social Needs  . Financial resource strain: None  . Food insecurity - worry: None  . Food insecurity - inability: None  . Transportation needs - medical: None  . Transportation needs - non-medical: None  Occupational History  . None  Tobacco Use  . Smoking status: Former Smoker    Packs/day: 0.25    Years: 15.00    Pack years: 3.75    Types: Cigarettes    Last attempt to quit: 02/03/2000    Years since quitting: 17.3  . Smokeless tobacco: Never Used  Substance and Sexual Activity  . Alcohol use: Yes    Comment: hx of ETOH use quit at age 25 was drinking beer  . Drug use: No  . Sexual activity: None  Other Topics Concern  . None  Social History Narrative  . None     Family History:  Family History  Problem Relation Age of Onset  . CAD Mother   . Heart attack Mother   . Heart disease Mother   . Hypertension Brother      ROS:   Please see the history of present illness. All other systems are reviewed and otherwise negative.    PHYSICAL EXAM:   VS:  BP 127/70   Pulse (!) 49   Ht 6' (1.829 m)   Wt (!) 351 lb 12.8 oz (159.6 kg)   BMI 47.71 kg/m   BMI: Body mass index is 47.71 kg/m. GEN: Morbidly obese friendly AAM in no acute distress  HEENT: normocephalic, atraumatic Neck: no JVD, carotid bruits, or masses Cardiac: Reg rhythm, bradycardic; no murmurs, rubs, or gallops, trace BLE edema R>L Respiratory:  clear to auscultation bilaterally, normal work of breathing GI: soft, nontender, nondistended, + BS MS: no deformity or atrophy  Skin: warm and dry, no rash Neuro:  Alert and Oriented x 3, Strength and sensation are intact, follows commands Psych: euthymic mood, full affect  Wt Readings from Last 3 Encounters:  05/19/17 (!) 351 lb 12.8 oz (159.6 kg)  03/19/16 (!) 349 lb (158.3 kg)  03/21/15 (!) 331 lb 12.8 oz (150.5 kg)      Studies/Labs Reviewed:   EKG:  EKG was ordered today and personally reviewed by me and  demonstrates sinus bradycardia 49bpm, RBBB, no acute ST-T changes  Recent Labs: No results found for requested labs within last 8760 hours.   Lipid Panel No results found for: CHOL, TRIG, HDL, CHOLHDL, VLDL, LDLCALC, LDLDIRECT  Additional studies/ records that were reviewed today include: Summarized above.    ASSESSMENT & PLAN:   1. Pre-operative evaluation/chest pressure - he describes an episode of atypical chest pressure about a month ago, resolved with burping. His weight makes noninvasive testing challenging. With his bradycardia I am not sure he would achieve target HR on an ETT and his history of  2nd degree heart block precludes Lexiscan testing. Will proceed with echocardiogram to ensure no changes to LV function or wall motion. If this is normal and he has no further symptoms, suspect he will be able to be cleared without further testing. Revised cardiac risk index score is currently 0.4% which would negate the need for further ischemic testing if he remains stable from symptom standpoint. See below regarding heart rate. His biggest risk of surgery comes from his morbid obesity and sleep apnea, as sedation may worsen his apnea thus he will need to be monitored closely for hypoxia if anesthesia is used.  2. Paroxysmal atrial fibrillation with bradycardia - HR is running 49, currently back in sinus bradycardia. Given the need for upcoming anesthesia as well as history of pre-syncope, will reduce diltiazem to 120mg  daily. Even when in atrial fib, his rate has been very slow so I do not suspect rebound RVR to occur. I have asked him to keep a log of blood pressures daily and bring to next OV to make sure his BP does not creep up with this reduction. Recheck BP by me in clinic was 127/70. Update labs for continued surveillance including TSH, BMET, and CBC since he is on warfarin. 3. HTN - BP recheck as above. Discussed fluid restriction to 2027mL per day given history of edema. Will update BNP  to assess if any contribution from diastolic CHF. We also discussed sodium reduction. 4. Morbid obesity with OSA - discussed importance of long-term weight gain and risks associated with morbid obesity. Will also get him back in to see Dr. Radford Pax for OSA eval next available (last OV 02/2016). 5. Hyperglycemia - check A1C to update CHADSVASC score. This is currently 2 for HTN and age, but may be 3 if he has developed DM.  Disposition: F/u with APP in 1 week to f/u echo and BP, and also arrange OSA f/u with Dr. Radford Pax. Will forward this note to make surgeon aware of plans for updated testing.    Medication Adjustments/Labs and Tests Ordered: Current medicines are reviewed at length with the patient today.  Concerns regarding medicines are outlined above. Medication changes, Labs and Tests ordered today are summarized above and listed in the Patient Instructions accessible in Encounters.   Signed, [name], PA-C  05/19/2017 9:03 AM    [facility] [place], [place], [place]  [address] Phone: [phone]; Fax: [phone]

## 2017-05-19 ENCOUNTER — Ambulatory Visit (INDEPENDENT_AMBULATORY_CARE_PROVIDER_SITE_OTHER): Payer: Medicare Other | Admitting: Physician Assistant

## 2017-05-19 ENCOUNTER — Encounter: Payer: Self-pay | Admitting: Physician Assistant

## 2017-05-19 VITALS — BP 127/70 | HR 49 | Ht 72.0 in | Wt 351.8 lb

## 2017-05-19 DIAGNOSIS — I480 Paroxysmal atrial fibrillation: Secondary | ICD-10-CM | POA: Diagnosis not present

## 2017-05-19 DIAGNOSIS — I10 Essential (primary) hypertension: Secondary | ICD-10-CM

## 2017-05-19 DIAGNOSIS — G4733 Obstructive sleep apnea (adult) (pediatric): Secondary | ICD-10-CM | POA: Diagnosis not present

## 2017-05-19 DIAGNOSIS — R739 Hyperglycemia, unspecified: Secondary | ICD-10-CM | POA: Diagnosis not present

## 2017-05-19 DIAGNOSIS — Z01810 Encounter for preprocedural cardiovascular examination: Secondary | ICD-10-CM | POA: Diagnosis not present

## 2017-05-19 DIAGNOSIS — R001 Bradycardia, unspecified: Secondary | ICD-10-CM | POA: Diagnosis not present

## 2017-05-19 DIAGNOSIS — R0789 Other chest pain: Secondary | ICD-10-CM

## 2017-05-19 MED ORDER — DILTIAZEM HCL ER COATED BEADS 120 MG PO CP24: 120.0000 mg | ORAL_CAPSULE | Freq: Every day | ORAL | 3 refills | Status: DC

## 2017-05-19 NOTE — Patient Instructions (Addendum)
Medication Instructions:  Your physician has recommended you make the following change in your medication:  1.  DECREASE the Cardizem to 120 mg daily   Labwork: TODAY:  BMET, CBC, TSH, PRO BNP, & HGBA1C  Testing/Procedures: Your physician has requested that you have an echocardiogram. Echocardiography is a painless test that uses sound waves to create images of your heart. It provides your doctor with information about the size and shape of your heart and how well your heart's chambers and valves are working. This procedure takes approximately one hour. There are no restrictions for this procedure.   Follow-Up: Your physician recommends that you schedule a follow-up appointment in: 1 WEEK AFTER ECHO WITH APP  Your physician recommends that you schedule a follow-up appointment in: [place] OSA    Any Other Special Instructions Will Be Listed Below (If Applicable).  Echocardiogram An echocardiogram, or echocardiography, uses sound waves (ultrasound) to produce an image of your heart. The echocardiogram is simple, painless, obtained within a short period of time, and offers valuable information to your health care provider. The images from an echocardiogram can provide information such as:  Evidence of coronary artery disease (CAD).  Heart size.  Heart muscle function.  Heart valve function.  Aneurysm detection.  Evidence of a past heart attack.  Fluid buildup around the heart.  Heart muscle thickening.  Assess heart valve function.  Tell a health care provider about:  Any allergies you have.  All medicines you are taking, including vitamins, herbs, eye drops, creams, and over-the-counter medicines.  Any problems you or family members have had with anesthetic medicines.  Any blood disorders you have.  Any surgeries you have had.  Any medical conditions you have.  Whether you are pregnant or may be pregnant. What happens before the  procedure? No special preparation is needed. Eat and drink normally. What happens during the procedure?  In order to produce an image of your heart, gel will be applied to your chest and a wand-like tool (transducer) will be moved over your chest. The gel will help transmit the sound waves from the transducer. The sound waves will harmlessly bounce off your heart to allow the heart images to be captured in real-time motion. These images will then be recorded.  You may need an IV to receive a medicine that improves the quality of the pictures. What happens after the procedure? You may return to your normal schedule including diet, activities, and medicines, unless your health care provider tells you otherwise. This information is not intended to replace advice given to you by your health care provider. Make sure you discuss any questions you have with your health care provider. Document Released: 06/13/2000 Document Revised: 02/02/2016 Document Reviewed: 02/21/2013 Elsevier Interactive Patient Education  2017 [employer].    If you need a refill on your cardiac medications before your next appointment, please call your pharmacy.

## 2017-05-20 LAB — BASIC METABOLIC PANEL
BUN/Creatinine Ratio: 12 (ref 10–24)
BUN: 12 mg/dL (ref 8–27)
CO2: 22 mmol/L (ref 20–29)
Calcium: 9.1 mg/dL (ref 8.6–10.2)
Chloride: 103 mmol/L (ref 96–106)
Creatinine, Ser: 0.97 mg/dL (ref 0.76–1.27)
GFR calc Af Amer: 93 mL/min/{1.73_m2} (ref >59)
GFR, EST NON AFRICAN AMERICAN: 80 mL/min/{1.73_m2} (ref >59)
Glucose: 106 mg/dL — ABNORMAL HIGH (ref 65–99)
Potassium: 4 mmol/L (ref 3.5–5.2)
SODIUM: 138 mmol/L (ref 134–144)

## 2017-05-20 LAB — CBC
HEMATOCRIT: 39.5 % (ref 37.5–51.0)
HEMOGLOBIN: 13.1 g/dL (ref 13.0–17.7)
MCH: 29.5 pg (ref 26.6–33.0)
MCHC: 33.2 g/dL (ref 31.5–35.7)
MCV: 89 fL (ref 79–97)
Platelets: 272 10*3/uL (ref 150–379)
RBC: 4.44 x10E6/uL (ref 4.14–5.80)
RDW: 13.6 % (ref 12.3–15.4)
WBC: 6.2 10*3/uL (ref 3.4–10.8)

## 2017-05-20 LAB — PRO B NATRIURETIC PEPTIDE: NT-PRO BNP: 68 pg/mL (ref 0–376)

## 2017-05-20 LAB — HEMOGLOBIN A1C
ESTIMATED AVERAGE GLUCOSE: 134 mg/dL
HEMOGLOBIN A1C: 6.3 % — AB (ref 4.8–5.6)

## 2017-05-20 LAB — TSH: TSH: 1.96 u[IU]/mL (ref 0.450–4.500)

## 2017-05-25 ENCOUNTER — Other Ambulatory Visit: Payer: Self-pay

## 2017-05-25 MED ORDER — LOSARTAN POTASSIUM 100 MG PO TABS: ORAL_TABLET | ORAL | 3 refills | Status: DC

## 2017-05-26 ENCOUNTER — Ambulatory Visit (HOSPITAL_COMMUNITY): Payer: Medicare Other | Attending: Cardiology

## 2017-05-26 ENCOUNTER — Other Ambulatory Visit: Payer: Self-pay

## 2017-05-26 ENCOUNTER — Ambulatory Visit: Payer: Medicare Other | Admitting: Physician Assistant

## 2017-05-26 DIAGNOSIS — I77810 Thoracic aortic ectasia: Secondary | ICD-10-CM | POA: Diagnosis not present

## 2017-05-26 DIAGNOSIS — Z6841 Body Mass Index (BMI) 40.0 and over, adult: Secondary | ICD-10-CM | POA: Diagnosis not present

## 2017-05-26 DIAGNOSIS — I080 Rheumatic disorders of both mitral and aortic valves: Secondary | ICD-10-CM | POA: Insufficient documentation

## 2017-05-26 DIAGNOSIS — I119 Hypertensive heart disease without heart failure: Secondary | ICD-10-CM | POA: Insufficient documentation

## 2017-05-26 DIAGNOSIS — I4510 Unspecified right bundle-branch block: Secondary | ICD-10-CM | POA: Diagnosis not present

## 2017-05-26 DIAGNOSIS — R0789 Other chest pain: Secondary | ICD-10-CM | POA: Insufficient documentation

## 2017-05-26 DIAGNOSIS — I4891 Unspecified atrial fibrillation: Secondary | ICD-10-CM | POA: Diagnosis not present

## 2017-05-26 DIAGNOSIS — Z87891 Personal history of nicotine dependence: Secondary | ICD-10-CM | POA: Diagnosis not present

## 2017-05-26 HISTORY — PX: TRANSTHORACIC ECHOCARDIOGRAM: SHX275

## 2017-05-27 ENCOUNTER — Telehealth: Payer: Self-pay | Admitting: Physician Assistant

## 2017-05-27 NOTE — Telephone Encounter (Signed)
Returned pts call and discussed his echo results. See result note.

## 2017-05-27 NOTE — Telephone Encounter (Signed)
F/u message  Pt returning call about echo results. Please call back to discuss

## 2017-05-27 NOTE — Telephone Encounter (Signed)
-----   Message from [name], [place] sent at 05/26/2017  3:25 PM EST ----- Please let patient know overall echo showed normal squeeze function but stiffened heart muscle which puts him at risk for fluid retention. Would advise to generally stick to lower sodium diet (aiming for maximim 2,000mg  per day) and staying hydrated but not to excess >2L per day of total fluid intake. The mainstays of treatment for this will be to work on weight reduction as we discussed in clinic, and also make sure BP is well controlled. F/u as planned. Dayna Dunn PA-C

## 2017-06-01 DIAGNOSIS — Z01818 Encounter for other preprocedural examination: Secondary | ICD-10-CM | POA: Insufficient documentation

## 2017-06-01 NOTE — Progress Notes (Signed)
Cardiology Office Note    Date:  06/02/2017   ID:  Alec Hunt, DOB 1950/10/22, MRN [id number]  PCP:  Vernie Shanks, MD  Cardiologist: Dr. Radford Pax  Chief Complaint  Patient presents with  . Follow-up    History of Present Illness:  Alec Hunt is a 66 y.o. male with history of persistent atrial fibrillation chads vasc, OSA on BiPAP, hyperglycemia.  Patient saw Sharrell Ku, PA-C 05/19/17 for preoperative clearance for prostate biopsy 06/11/17 for rising PSA.  He had an episode of atypical chest pain that felt like acid reflux resolved completely with the burp the month before.  He also had some dizziness during the summer when working in the yard in the heat requiring him to sit down.  In a did not feel he could achieve his target heart rate on ETT given his bradycardia and could not do a Lexiscan given his second-degree heart block.  She ordered an echo which showed normal LV function ejection fraction 60-65% with grade 2 DD, mildly dilated ascending aorta.  She also decreased his diltiazem to 120 mg daily because heart rate of 49.  Hemoglobin A1c was 6.3 and he was told to follow-up with his PCP.  Patient comes in today for follow-up.  He brings readings of his blood pressure and pulse from home.  His blood pressure has been running high since diltiazem was decreased but his pulses come up in the 60s.  He feels fine.  He does admit to getting extra salt in his diet.  Past Medical History:  Diagnosis Date  . Arthritis    kness & shoulders  . Bradycardia   . Hyperglycemia   . Hypertension   . Mobitz type 1 second degree atrioventricular block   . Morbid obesity ([place])   . OSA (obstructive sleep apnea)    Severe w AHI 81.28/hr now on Bipap  . Persistent atrial fibrillation ([place])   . RBBB   . Syncope    history of in the setting of afib    Past Surgical History:  Procedure Laterality Date  . CARDIAC CATHETERIZATION    . COLONOSCOPY    . knee arthroscopic    . LUMBAR  LAMINECTOMY/DECOMPRESSION MICRODISCECTOMY N/A 09/27/2014   Procedure: LUMBAR LAMINECTOMY/DECOMPRESSION MICRODISCECTOMY 3 LEVELS L4-5 L3-4 POSSIBLE L2-3 BILATERAL FORAMINOTOMIES AT L-3,4 AND 5;  Surgeon: Susa Day, MD;  Location: [facility];  Service: Orthopedics;  Laterality: N/A;  . right rotator cuff surgery      2015    Current Medications: Current Meds  Medication Sig  . diltiazem (CARDIZEM CD) 120 MG 24 hr capsule Take 1 capsule (120 mg total) daily by mouth.  . losartan (COZAAR) 100 MG tablet TAKE 1 TABLET BY MOUTH EVERY DAY IN THE MORNING  . metoprolol tartrate (LOPRESSOR) 25 MG tablet TAKE 1 TABLET BY MOUTH 2 TIMES DAILY.  Marland Kitchen warfarin (COUMADIN) 5 MG tablet TAKE AS DIRECTED BY COUMADIN CLINIC     Allergies:   Patient has no known allergies.   Social History   Socioeconomic History  . Marital status: Married    Spouse name: None  . Number of children: None  . Years of education: None  . Highest education level: None  Social Needs  . Financial resource strain: None  . Food insecurity - worry: None  . Food insecurity - inability: None  . Transportation needs - medical: None  . Transportation needs - non-medical: None  Occupational History  . None  Tobacco Use  .  Smoking status: Former Smoker    Packs/day: 0.25    Years: 15.00    Pack years: 3.75    Types: Cigarettes    Last attempt to quit: 02/03/2000    Years since quitting: 17.3  . Smokeless tobacco: Never Used  Substance and Sexual Activity  . Alcohol use: Yes    Comment: hx of ETOH use quit at age 70 was drinking beer  . Drug use: No  . Sexual activity: None  Other Topics Concern  . None  Social History Narrative  . None     Family History:  The patient's family history includes CAD in his mother; Heart attack in his mother; Heart disease in his mother; Hypertension in his brother.   ROS:   Please see the history of present illness.    Review of Systems  Cardiovascular: Positive for leg swelling.   All  other systems reviewed and are negative.   PHYSICAL EXAM:   VS:  BP (!) 168/90   Pulse 62   Ht 6\' 1"  (1.854 m)   Wt (!) 349 lb (158.3 kg)   SpO2 97%   BMI 46.04 kg/m   Physical Exam  GEN: Well nourished, well developed, in no acute distress  Neck: no JVD, carotid bruits, or masses Cardiac:RRR; positive S4, 2/6 systolic murmur at the left sternal border Respiratory:  clear to auscultation bilaterally, normal work of breathing GI: soft, nontender, nondistended, + BS Ext: Trace of edema bilaterally without cyanosis, clubbing,  Good distal pulses bilaterally Neuro:  Alert and Oriented x 3 Psych: euthymic mood, full affect  Wt Readings from Last 3 Encounters:  06/02/17 (!) 349 lb (158.3 kg)  05/19/17 (!) 351 lb 12.8 oz (159.6 kg)  03/19/16 (!) 349 lb (158.3 kg)      Studies/Labs Reviewed:   EKG:  EKG is not ordered today.    Recent Labs: 05/19/2017: BUN 12; Creatinine, Ser 0.97; Hemoglobin 13.1; NT-Pro BNP 68; Platelets 272; Potassium 4.0; Sodium 138; TSH 1.960   Lipid Panel No results found for: CHOL, TRIG, HDL, CHOLHDL, VLDL, LDLCALC, LDLDIRECT  Additional studies/ records that were reviewed today include:  2D echo 05/26/17  Study Conclusions   - Left ventricle: The cavity size was normal. Wall thickness was   increased in a pattern of mild LVH. Systolic function was normal.   The estimated ejection fraction was in the range of 60% to 65%.   Wall motion was normal; there were no regional wall motion   abnormalities. Features are consistent with a pseudonormal left   ventricular filling pattern, with concomitant abnormal relaxation   and increased filling pressure (grade 2 diastolic dysfunction). - Aortic valve: Trileaflet; moderately calcified leaflets. There   was no stenosis. - Aorta: Mildly dilated ascending aorta. Ascending aortic diameter:   41 mm (S). - Mitral valve: Mildly calcified annulus. There was no significant   regurgitation. - Right ventricle: The  cavity size was normal. Systolic function   was normal. - Right atrium: The atrium was mildly dilated. - Tricuspid valve: Peak RV-RA gradient (S): 27 mm Hg. - Pulmonary arteries: PA peak pressure: 30 mm Hg (S). - Inferior vena cava: The vessel was normal in size. The   respirophasic diameter changes were in the normal range (>= 50%),   consistent with normal central venous pressure.   Impressions:   - Normal LV size with mild LV hypertrophy, EF 60-65%. Moderate   diastolic dysfunction. Normal RV size and systolic function. No   significant valvular abnormalities.   ----------  ASSESSMENT:    1. Preoperative clearance   2. Persistent atrial fibrillation ([place])   3. Bradycardia   4. Essential hypertension   5. Morbid obesity ([place])   6. OSA (obstructive sleep apnea)      PLAN:  In order of problems listed above:  Preoperative clearance patient's 2D echo showed normal LV function with grade 2 DD.  Revised cardiac risk index score is 0.4% which would negate need for further ischemic testing.  He is cleared for prostate biopsy by Dr. Nicolette Bang without further cardiac workup.  Persistent atrial fibrillation on Coumadin, metoprolol and diltiazem.  Pulse up in the 60s since diltiazem decreased  Bradycardia  Pulse up in the 60s since diltiazem since decreased  Essential hypertension blood pressure running high since diltiazem was decreased.  Will begin HCTZ 25 mg once daily.  2 g sodium diet.  Patient says he had near syncope on a fluid medication in the past but thinks it was Lasix.  I asked him to call if he becomes dizzy.  Follow-up with the pharmacy December 13 for blood pressure check as well as BMP and Coumadin check.  Morbid obesity weight loss recommended.  OSA on bipap followed by Dr. Radford Pax.    Medication Adjustments/Labs and Tests Ordered: Current medicines are reviewed at length with the patient today.  Concerns regarding medicines are outlined above.   Medication changes, Labs and Tests ordered today are listed in the Patient Instructions below. There are no Patient Instructions on file for this visit.   Sumner Boast, PA-C  06/02/2017 8:53 AM    [facility] [place], [place], [place]  [address] Phone: [phone]; Fax: [phone]

## 2017-06-02 ENCOUNTER — Encounter (INDEPENDENT_AMBULATORY_CARE_PROVIDER_SITE_OTHER): Payer: Self-pay

## 2017-06-02 ENCOUNTER — Ambulatory Visit: Payer: Medicare Other | Admitting: Physician Assistant

## 2017-06-02 ENCOUNTER — Encounter: Payer: Self-pay | Admitting: Physician Assistant

## 2017-06-02 VITALS — BP 168/90 | HR 62 | Ht 73.0 in | Wt 349.0 lb

## 2017-06-02 DIAGNOSIS — I481 Persistent atrial fibrillation: Secondary | ICD-10-CM

## 2017-06-02 DIAGNOSIS — I10 Essential (primary) hypertension: Secondary | ICD-10-CM

## 2017-06-02 DIAGNOSIS — R001 Bradycardia, unspecified: Secondary | ICD-10-CM

## 2017-06-02 DIAGNOSIS — I4819 Other persistent atrial fibrillation: Secondary | ICD-10-CM

## 2017-06-02 DIAGNOSIS — Z01810 Encounter for preprocedural cardiovascular examination: Secondary | ICD-10-CM

## 2017-06-02 DIAGNOSIS — G4733 Obstructive sleep apnea (adult) (pediatric): Secondary | ICD-10-CM | POA: Diagnosis not present

## 2017-06-02 DIAGNOSIS — Z01818 Encounter for other preprocedural examination: Secondary | ICD-10-CM

## 2017-06-02 MED ORDER — HYDROCHLOROTHIAZIDE 25 MG PO TABS: 25.0000 mg | ORAL_TABLET | Freq: Every day | ORAL | 3 refills | Status: DC

## 2017-06-02 NOTE — Patient Instructions (Signed)
Medication Instructions:  Your physician has recommended you make the following change in your medication:   START: HCTZ 25 mg once a day   Labwork: Your physician recommends that you return for lab work in: on 06/10/17 for basic metabolic panel   Testing/Procedures: None ordered   Follow-Up: Your physician recommends that you schedule a follow-up appointment at the HTN clinic on 06/10/17.   Your physician recommends that you schedule a follow-up appointment in: 3 months with Dr. Radford Pax.     Any Other Special Instructions Will Be Listed Below (If Applicable).  Your physician recommends you follow a 2g sodium diet   Low-Sodium Eating Plan Sodium, which is an element that makes up salt, helps you maintain a healthy balance of fluids in your body. Too much sodium can increase your blood pressure and cause fluid and waste to be held in your body. Your health care provider or dietitian may recommend following this plan if you have high blood pressure (hypertension), kidney disease, liver disease, or heart failure. Eating less sodium can help lower your blood pressure, reduce swelling, and protect your heart, liver, and kidneys. What are tips for following this plan? General guidelines  Most people on this plan should limit their sodium intake to 1,500-2,000 mg (milligrams) of sodium each day. Reading food labels  The Nutrition Facts label lists the amount of sodium in one serving of the food. If you eat more than one serving, you must multiply the listed amount of sodium by the number of servings.  Choose foods with less than 140 mg of sodium per serving.  Avoid foods with 300 mg of sodium or more per serving. Shopping  Look for lower-sodium products, often labeled as "low-sodium" or "no salt added."  Always check the sodium content even if foods are labeled as "unsalted" or "no salt added".  Buy fresh foods. ? Avoid canned foods and premade or frozen meals. ? Avoid canned,  cured, or processed meats  Buy breads that have less than 80 mg of sodium per slice. Cooking  Eat more home-cooked food and less restaurant, buffet, and fast food.  Avoid adding salt when cooking. Use salt-free seasonings or herbs instead of table salt or sea salt. Check with your health care provider or pharmacist before using salt substitutes.  Cook with plant-based oils, such as canola, sunflower, or olive oil. Meal planning  When eating at a restaurant, ask that your food be prepared with less salt or no salt, if possible.  Avoid foods that contain MSG (monosodium glutamate). MSG is sometimes added to [place] food, bouillon, and some canned foods. What foods are recommended? The items listed may not be a complete list. Talk with your dietitian about what dietary choices are best for you. Grains Low-sodium cereals, including oats, puffed wheat and rice, and shredded wheat. Low-sodium crackers. Unsalted rice. Unsalted pasta. Low-sodium bread. Whole-grain breads and whole-grain pasta. Vegetables Fresh or frozen vegetables. "No salt added" canned vegetables. "No salt added" tomato sauce and paste. Low-sodium or reduced-sodium tomato and vegetable juice. Fruits Fresh, frozen, or canned fruit. Fruit juice. Meats and other protein foods Fresh or frozen (no salt added) meat, poultry, seafood, and fish. Low-sodium canned tuna and salmon. Unsalted nuts. Dried peas, beans, and lentils without added salt. Unsalted canned beans. Eggs. Unsalted nut butters. Dairy Milk. Soy milk. Cheese that is naturally low in sodium, such as ricotta cheese, fresh mozzarella, or Swiss cheese Low-sodium or reduced-sodium cheese. Cream cheese. Yogurt. Fats and oils Unsalted butter. Unsalted  margarine with no trans fat. Vegetable oils such as canola or olive oils. Seasonings and other foods Fresh and dried herbs and spices. Salt-free seasonings. Low-sodium mustard and ketchup. Sodium-free salad dressing. Sodium-free  light mayonnaise. Fresh or refrigerated horseradish. Lemon juice. Vinegar. Homemade, reduced-sodium, or low-sodium soups. Unsalted popcorn and pretzels. Low-salt or salt-free chips. What foods are not recommended? The items listed may not be a complete list. Talk with your dietitian about what dietary choices are best for you. Grains Instant hot cereals. Bread stuffing, pancake, and biscuit mixes. Croutons. Seasoned rice or pasta mixes. Noodle soup cups. Boxed or frozen macaroni and cheese. Regular salted crackers. Self-rising flour. Vegetables Sauerkraut, pickled vegetables, and relishes. Olives. [place] fries. Onion rings. Regular canned vegetables (not low-sodium or reduced-sodium). Regular canned tomato sauce and paste (not low-sodium or reduced-sodium). Regular tomato and vegetable juice (not low-sodium or reduced-sodium). Frozen vegetables in sauces. Meats and other protein foods Meat or fish that is salted, canned, smoked, spiced, or pickled. Bacon, ham, sausage, hotdogs, corned beef, chipped beef, packaged lunch meats, salt pork, jerky, pickled herring, anchovies, regular canned tuna, sardines, salted nuts. Dairy Processed cheese and cheese spreads. Cheese curds. Blue cheese. Feta cheese. String cheese. Regular cottage cheese. Buttermilk. Canned milk. Fats and oils Salted butter. Regular margarine. Ghee. Bacon fat. Seasonings and other foods Onion salt, garlic salt, seasoned salt, table salt, and sea salt. Canned and packaged gravies. Worcestershire sauce. Tartar sauce. Barbecue sauce. Teriyaki sauce. Soy sauce, including reduced-sodium. Steak sauce. Fish sauce. Oyster sauce. Cocktail sauce. Horseradish that you find on the shelf. Regular ketchup and mustard. Meat flavorings and tenderizers. Bouillon cubes. Hot sauce and Tabasco sauce. Premade or packaged marinades. Premade or packaged taco seasonings. Relishes. Regular salad dressings. Salsa. Potato and tortilla chips. Corn chips and puffs.  Salted popcorn and pretzels. Canned or dried soups. Pizza. Frozen entrees and pot pies. Summary  Eating less sodium can help lower your blood pressure, reduce swelling, and protect your heart, liver, and kidneys.  Most people on this plan should limit their sodium intake to 1,500-2,000 mg (milligrams) of sodium each day.  Canned, boxed, and frozen foods are high in sodium. Restaurant foods, fast foods, and pizza are also very high in sodium. You also get sodium by adding salt to food.  Try to cook at home, eat more fresh fruits and vegetables, and eat less fast food, canned, processed, or prepared foods. This information is not intended to replace advice given to you by your health care provider. Make sure you discuss any questions you have with your health care provider. Document Released: 12/06/2001 Document Revised: 06/09/2016 Document Reviewed: 06/09/2016 Elsevier Interactive Patient Education  2017 [employer].      If you need a refill on your cardiac medications before your next appointment, please call your pharmacy.

## 2017-06-08 NOTE — Progress Notes (Deleted)
Patient ID: Alec Hunt                 DOB: 07/11/1950                      MRN: [id number]     HPI: Alec Hunt is a 66 y.o. male patient of Dr. Radford Pax who presents today for hypertension evaluation.  PMH includes persistent atrial fibrillation, OSA on BiPAP, HTN, and hyperglycemia. His recent Echo showed normal EF 60-65% with grade 2DD. He was previously seen by Melina Copa, PA and his diltiazem was decreased d/t low HR in the 40s. He has since seen Ermalinda Barrios, PA for f/u at which time he was started on HCTZ 25mg  for elevated pressures.   He presents today for follow up.    Current HTN meds:  Diltiazem CD 120mg  daily HCTZ 25mg  daily  Losartan 100mg  daily  Metoprolol tartrate 25mg  BID  Previously tried: higher doses of dilt - low HR  BP goal: <130/80  Family History: The patient's family history includes CAD in his mother; Heart attack in his mother; Heart disease in his mother; Hypertension in his brother.   Social History:   Diet:   Exercise:   Home BP readings:   Wt Readings from Last 3 Encounters:  06/02/17 (!) 349 lb (158.3 kg)  05/19/17 (!) 351 lb 12.8 oz (159.6 kg)  03/19/16 (!) 349 lb (158.3 kg)   BP Readings from Last 3 Encounters:  06/02/17 (!) 168/90  05/19/17 127/70  03/19/16 124/78   Pulse Readings from Last 3 Encounters:  06/02/17 62  05/19/17 (!) 49  03/19/16 (!) 54    Renal function: Estimated Creatinine Clearance: 116.3 mL/min (by C-G formula based on SCr of 0.97 mg/dL).  Past Medical History:  Diagnosis Date  . Arthritis    kness & shoulders  . Bradycardia   . Hyperglycemia   . Hypertension   . Mobitz type 1 second degree atrioventricular block   . Morbid obesity ([place])   . OSA (obstructive sleep apnea)    Severe w AHI 81.28/hr now on Bipap  . Persistent atrial fibrillation ([place])   . RBBB   . Syncope    history of in the setting of afib    Current Outpatient Medications on File Prior to Visit  Medication Sig Dispense  Refill  . diltiazem (CARDIZEM CD) 120 MG 24 hr capsule Take 1 capsule (120 mg total) daily by mouth. 90 capsule 3  . hydrochlorothiazide (HYDRODIURIL) 25 MG tablet Take 1 tablet (25 mg total) by mouth daily. 90 tablet 3  . losartan (COZAAR) 100 MG tablet TAKE 1 TABLET BY MOUTH EVERY DAY IN THE MORNING 90 tablet 3  . metoprolol tartrate (LOPRESSOR) 25 MG tablet TAKE 1 TABLET BY MOUTH 2 TIMES DAILY. 60 tablet 2  . warfarin (COUMADIN) 5 MG tablet TAKE AS DIRECTED BY COUMADIN CLINIC 180 tablet 1   No current facility-administered medications on file prior to visit.     No Known Allergies  There were no vitals taken for this visit.   Assessment/Plan: Hypertension:    Thank you, Lelan Pons. Patterson Hammersmith, [facility]  06/08/2017 2:17 PM

## 2017-06-10 ENCOUNTER — Ambulatory Visit (INDEPENDENT_AMBULATORY_CARE_PROVIDER_SITE_OTHER): Payer: Medicare Other | Admitting: Pharmacist

## 2017-06-10 ENCOUNTER — Other Ambulatory Visit: Payer: Medicare Other | Admitting: *Deleted

## 2017-06-10 ENCOUNTER — Encounter: Payer: Self-pay | Admitting: Pharmacist

## 2017-06-10 ENCOUNTER — Ambulatory Visit: Payer: Medicare Other

## 2017-06-10 VITALS — BP 128/84 | HR 52

## 2017-06-10 DIAGNOSIS — Z5181 Encounter for therapeutic drug level monitoring: Secondary | ICD-10-CM | POA: Diagnosis not present

## 2017-06-10 DIAGNOSIS — I10 Essential (primary) hypertension: Secondary | ICD-10-CM

## 2017-06-10 DIAGNOSIS — I4891 Unspecified atrial fibrillation: Secondary | ICD-10-CM

## 2017-06-10 DIAGNOSIS — I4819 Other persistent atrial fibrillation: Secondary | ICD-10-CM

## 2017-06-10 DIAGNOSIS — I481 Persistent atrial fibrillation: Secondary | ICD-10-CM

## 2017-06-10 LAB — BASIC METABOLIC PANEL
BUN/Creatinine Ratio: 16 (ref 10–24)
BUN: 18 mg/dL (ref 8–27)
CALCIUM: 10.1 mg/dL (ref 8.6–10.2)
CHLORIDE: 98 mmol/L (ref 96–106)
CO2: 25 mmol/L (ref 20–29)
Creatinine, Ser: 1.1 mg/dL (ref 0.76–1.27)
GFR, EST AFRICAN AMERICAN: 80 mL/min/{1.73_m2} (ref >59)
GFR, EST NON AFRICAN AMERICAN: 69 mL/min/{1.73_m2} (ref >59)
Glucose: 114 mg/dL — ABNORMAL HIGH (ref 65–99)
POTASSIUM: 4.5 mmol/L (ref 3.5–5.2)
SODIUM: 137 mmol/L (ref 134–144)

## 2017-06-10 LAB — POCT INR: INR: 1.3

## 2017-06-10 NOTE — Progress Notes (Signed)
Patient ID: Alec Hunt                 DOB: Mar 29, 1951                      MRN: [id number]     HPI: Alec Hunt is a 66 y.o. male patient of Dr. Radford Pax who presents today for hypertension evaluation.  PMH includes persistent atrial fibrillation, OSA on BiPAP, HTN, and hyperglycemia. His recent Echo showed normal EF 60-65% with grade 2DD. He was previously seen by Melina Copa, PA and his diltiazem was decreased d/t low HR in the 40s. He has since seen Ermalinda Barrios, PA for f/u at which time he was started on HCTZ 25mg  for elevated pressures.   He presents today for follow up. He reports that he feels better since the recent lifestyle changes. He states his pressures have been much better at home and his blood pressure cuff has read in the "green" range the last few times (which he says is an improvement), previously they have been running in the "red zone".   He states that he has not had any dizziness with the addition of HCTZ 25mg . Though he does report when he was previously on HCTZ12.5mg  he did have an episode while at church, but admits he may have been dehydrated/overwhelmed at the time. He states that the medication was stopped due to this episode.   Current HTN meds:  Diltiazem CD 120mg  daily HCTZ 25mg  daily  Losartan 100mg  daily  Metoprolol tartrate 25mg  BID  Previously tried: higher doses of dilt - low HR  BP goal: <130/80  Family History: The patient's family history includes CAD in his mother; Heart attack in his mother; Heart disease in his mother; Hypertension in his brother.   Social History: Quit tobacco products and alcohol about 17 years ago.   Diet: Recently has been eating at home more. He is eating mostly baked. Has been cutting back on salt.   Exercise: He has a stationary bike and elliptical.   Home BP readings: 125/83 last night  Wt Readings from Last 3 Encounters:  06/02/17 (!) 349 lb (158.3 kg)  05/19/17 (!) 351 lb 12.8 oz (159.6 kg)  03/19/16 (!)  349 lb (158.3 kg)   BP Readings from Last 3 Encounters:  06/10/17 128/84  06/02/17 (!) 168/90  05/19/17 127/70   Pulse Readings from Last 3 Encounters:  06/10/17 (!) 52  06/02/17 62  05/19/17 (!) 49    Renal function: CrCl cannot be calculated (Patient's most recent lab result is older than the maximum 21 days allowed.).  Past Medical History:  Diagnosis Date  . Arthritis    kness & shoulders  . Bradycardia   . Hyperglycemia   . Hypertension   . Mobitz type 1 second degree atrioventricular block   . Morbid obesity ([place])   . OSA (obstructive sleep apnea)    Severe w AHI 81.28/hr now on Bipap  . Persistent atrial fibrillation ([place])   . RBBB   . Syncope    history of in the setting of afib    Current Outpatient Medications on File Prior to Visit  Medication Sig Dispense Refill  . diltiazem (CARDIZEM CD) 120 MG 24 hr capsule Take 1 capsule (120 mg total) daily by mouth. 90 capsule 3  . hydrochlorothiazide (HYDRODIURIL) 25 MG tablet Take 1 tablet (25 mg total) by mouth daily. 90 tablet 3  . losartan (COZAAR) 100 MG tablet TAKE 1  TABLET BY MOUTH EVERY DAY IN THE MORNING 90 tablet 3  . metoprolol tartrate (LOPRESSOR) 25 MG tablet TAKE 1 TABLET BY MOUTH 2 TIMES DAILY. 60 tablet 2  . warfarin (COUMADIN) 5 MG tablet TAKE AS DIRECTED BY COUMADIN CLINIC 180 tablet 1   No current facility-administered medications on file prior to visit.     No Known Allergies  Blood pressure 128/84, pulse (!) 52.   Assessment/Plan: Hypertension: BP at borderline at goal today. No changes to medications. He also has labs scheduled for today. Advised he call if he develops dizziness. Otherwise he will follow up with Dr. Radford Pax as scheduled and HTN clinic as needed.     Thank you, Lelan Pons. Patterson Hammersmith, [facility]  06/10/2017 3:13 PM

## 2017-06-10 NOTE — Patient Instructions (Signed)
Continue holding Coumadin through tomorrow for your biopsy. Resume Coumadin tomorrow evening after your procedure or as directed by urologist. Resume at normal dose of 7.5mg  daily except 10 mg on Mondays, Wednesdays, and Fridays. Recheck INR in 3 weeks

## 2017-06-10 NOTE — Patient Instructions (Signed)
Return for a follow up appointment as scheduled with Dr. Radford Pax  Check your blood pressure at home daily (if able) and keep record of the readings.  Take your BP meds as follows: Continue all as prescribed Call [phone] if you have any dizziness  Bring all of your meds, your BP cuff and your record of home blood pressures to your next appointment.  Exercise as you're able, try to walk approximately 30 minutes per day.  Keep salt intake to a minimum, especially watch canned and prepared boxed foods.  Eat more fresh fruits and vegetables and fewer canned items.  Avoid eating in fast food restaurants.    HOW TO TAKE YOUR BLOOD PRESSURE: . Rest 5 minutes before taking your blood pressure. .  Don't smoke or drink caffeinated beverages for at least 30 minutes before. . Take your blood pressure before (not after) you eat. . Sit comfortably with your back supported and both feet on the floor (don't cross your legs). . Elevate your arm to heart level on a table or a desk. . Use the proper sized cuff. It should fit smoothly and snugly around your bare upper arm. There should be enough room to slip a fingertip under the cuff. The bottom edge of the cuff should be 1 inch above the crease of the elbow. . Ideally, take 3 measurements at one sitting and record the average.

## 2017-06-19 ENCOUNTER — Other Ambulatory Visit: Payer: Self-pay | Admitting: Urology

## 2017-06-19 DIAGNOSIS — C61 Malignant neoplasm of prostate: Secondary | ICD-10-CM

## 2017-07-02 ENCOUNTER — Encounter (HOSPITAL_COMMUNITY): Payer: Medicare Other

## 2017-07-02 ENCOUNTER — Encounter (HOSPITAL_COMMUNITY)
Admission: RE | Admit: 2017-07-02 | Discharge: 2017-07-02 | Disposition: A | Discharge status: Home / Self Care | Payer: Medicare Other | Source: Ambulatory Visit | Attending: Urology | Admitting: Urology

## 2017-07-02 DIAGNOSIS — C61 Malignant neoplasm of prostate: Secondary | ICD-10-CM | POA: Insufficient documentation

## 2017-07-02 MED ORDER — TECHNETIUM TC 99M MEDRONATE IV KIT
20.8000 | PACK | Freq: Once | INTRAVENOUS | Status: AC | PRN
  Administered 2017-07-02: 20.8 via INTRAVENOUS

## 2017-07-03 ENCOUNTER — Ambulatory Visit (INDEPENDENT_AMBULATORY_CARE_PROVIDER_SITE_OTHER): Payer: Medicare Other | Admitting: *Deleted

## 2017-07-03 DIAGNOSIS — I4891 Unspecified atrial fibrillation: Secondary | ICD-10-CM

## 2017-07-03 DIAGNOSIS — I4819 Other persistent atrial fibrillation: Secondary | ICD-10-CM

## 2017-07-03 DIAGNOSIS — Z5181 Encounter for therapeutic drug level monitoring: Secondary | ICD-10-CM | POA: Diagnosis not present

## 2017-07-03 LAB — POCT INR: INR: 2.6

## 2017-07-03 NOTE — Patient Instructions (Signed)
Description   Continue taking  7.5mg  daily except 10 mg on Mondays, Wednesdays, and Fridays. Recheck INR in 6 weeks. Call us with any medication changes or concerns # [phone] [facility].

## 2017-07-06 ENCOUNTER — Ambulatory Visit
Admission: RE | Admit: 2017-07-06 | Discharge: 2017-07-06 | Disposition: A | Discharge status: Home / Self Care | Payer: Medicare Other | Source: Ambulatory Visit | Attending: Radiation Oncology | Admitting: Radiation Oncology

## 2017-07-06 ENCOUNTER — Encounter: Payer: Self-pay | Admitting: Radiation Oncology

## 2017-07-06 ENCOUNTER — Encounter: Payer: Self-pay | Admitting: Medical Oncology

## 2017-07-06 ENCOUNTER — Other Ambulatory Visit: Payer: Self-pay

## 2017-07-06 VITALS — BP 118/82 | HR 56 | Temp 98.3°F | Resp 16 | Ht 73.0 in | Wt 342.6 lb

## 2017-07-06 DIAGNOSIS — I4510 Unspecified right bundle-branch block: Secondary | ICD-10-CM | POA: Diagnosis not present

## 2017-07-06 DIAGNOSIS — Z7901 Long term (current) use of anticoagulants: Secondary | ICD-10-CM | POA: Insufficient documentation

## 2017-07-06 DIAGNOSIS — I481 Persistent atrial fibrillation: Secondary | ICD-10-CM | POA: Insufficient documentation

## 2017-07-06 DIAGNOSIS — C61 Malignant neoplasm of prostate: Secondary | ICD-10-CM | POA: Insufficient documentation

## 2017-07-06 DIAGNOSIS — Z8249 Family history of ischemic heart disease and other diseases of the circulatory system: Secondary | ICD-10-CM | POA: Insufficient documentation

## 2017-07-06 DIAGNOSIS — Z79899 Other long term (current) drug therapy: Secondary | ICD-10-CM | POA: Diagnosis not present

## 2017-07-06 DIAGNOSIS — G4733 Obstructive sleep apnea (adult) (pediatric): Secondary | ICD-10-CM | POA: Insufficient documentation

## 2017-07-06 DIAGNOSIS — Z87891 Personal history of nicotine dependence: Secondary | ICD-10-CM | POA: Diagnosis not present

## 2017-07-06 DIAGNOSIS — I10 Essential (primary) hypertension: Secondary | ICD-10-CM | POA: Insufficient documentation

## 2017-07-06 HISTORY — DX: Malignant neoplasm of prostate: C61

## 2017-07-06 NOTE — Progress Notes (Signed)
Introduced myself to Alec Hunt and his wife as the prostate nurse navigator and my role. He states he has limited knowledge of his cancer and treatment options. I explained that Dr. Tammi Klippel will discuss these with him today and if I can answer and questions or concerns to please call me. I will continue to follow.

## 2017-07-06 NOTE — Progress Notes (Addendum)
Radiation Oncology         [phone] ________________________________  Initial Outpatient Consultation  Name: Alec Hunt MRN: [id number]  Date: 07/06/2017  DOB: [birth year]  IO:EVOJ, [name], MD  McKenzie, Candee Furbish, MD   REFERRING PHYSICIAN: Cleon Gustin, MD  DIAGNOSIS: 67 y.o. gentleman with Stage T1c, high risk, adenocarcinoma of the prostate with Gleason Score of 4+5, and PSA of 7.65    ICD-10-CM   1. Malignant neoplasm of prostate ([place]) C61     HISTORY OF PRESENT ILLNESS: Alec Hunt is a 67 y.o. male with a diagnosis of prostate cancer. He was noted to have an elevated PSA of 7.65 by his primary care physician, Dr. Yaakov Guthrie.  Accordingly, he was referred for evaluation in urology to Dr. Nicolette Bang on 04/24/2017, where a digital rectal examination was performed at that time revealing a hard right lobe but no prostate nodules.  The patient proceeded to transrectal ultrasound with 12 biopsies of the prostate on 06/11/2017.  The prostate volume measured 32.8 cc.  Out of 12 core biopsies, 6 were positive.  The maximum Gleason score was 4+5, and this was seen in the right apex.  Biopsy of prostate revealed:    The patient reviewed the biopsy results with his urologist and he has kindly been referred today for discussion of potential radiation treatment options. He is accompanied by his wife. Given the patient's high-risk prostate cancer, he had imaging studies done to rule out any metastatic disease. CT scan of the abdomen/pelvis on 07/02/17 showed no concerns for pelvic adenopathy or metastatic disease. He does have chronic changes of several mesenteric nodes that has been seen since 2015. A Bone scan on 07/02/2017 showed no evidence of osseous metastatic disease.   PREVIOUS RADIATION THERAPY: No  PAST MEDICAL HISTORY:  Past Medical History:  Diagnosis Date  . Arthritis    kness & shoulders  . Bradycardia   . Hyperglycemia   . Hypertension   .  Mobitz type 1 second degree atrioventricular block   . Morbid obesity ([place])   . OSA (obstructive sleep apnea)    Severe w AHI 81.28/hr now on Bipap  . Persistent atrial fibrillation ([place])   . Prostate cancer ([place])   . RBBB   . Syncope    history of in the setting of afib      PAST SURGICAL HISTORY: Past Surgical History:  Procedure Laterality Date  . CARDIAC CATHETERIZATION    . COLONOSCOPY    . knee arthroscopic    . LUMBAR LAMINECTOMY/DECOMPRESSION MICRODISCECTOMY N/A 09/27/2014   Procedure: LUMBAR LAMINECTOMY/DECOMPRESSION MICRODISCECTOMY 3 LEVELS L4-5 L3-4 POSSIBLE L2-3 BILATERAL FORAMINOTOMIES AT L-3,4 AND 5;  Surgeon: Susa Day, MD;  Location: [facility];  Service: Orthopedics;  Laterality: N/A;  . PROSTATE BIOPSY    . right rotator cuff surgery      2015    FAMILY HISTORY:  Family History  Problem Relation Age of Onset  . CAD Mother   . Heart attack Mother   . Heart disease Mother   . Hypertension Brother   . Cancer Neg Hx     SOCIAL HISTORY:  Social History   Socioeconomic History  . Marital status: Married    Spouse name: Not on file  . Number of children: 2  . Years of education: Not on file  . Highest education level: Not on file  Social Needs  . Financial resource strain: Not on file  . Food insecurity - worry: Not on  file  . Food insecurity - inability: Not on file  . Transportation needs - medical: Not on file  . Transportation needs - non-medical: Not on file  Occupational History  . Occupation: retired    Fish farm manager: [employer]  Tobacco Use  . Smoking status: Former Smoker    Packs/day: 0.25    Years: 15.00    Pack years: 3.75    Types: Cigarettes    Last attempt to quit: 02/03/2000    Years since quitting: 17.4  . Smokeless tobacco: Never Used  Substance and Sexual Activity  . Alcohol use: Yes    Comment: hx of ETOH use quit at age 46 was drinking beer  . Drug use: No  . Sexual activity: Not Currently  Other Topics Concern  . Not on file   Social History Narrative   Married.    Retired from [employer] after 43 years.   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  ALLERGIES: Patient has no known allergies.  MEDICATIONS:  Current Outpatient Medications  Medication Sig Dispense Refill  . diltiazem (CARDIZEM CD) 120 MG 24 hr capsule Take 1 capsule (120 mg total) daily by mouth. 90 capsule 3  . hydrochlorothiazide (HYDRODIURIL) 25 MG tablet Take 1 tablet (25 mg total) by mouth daily. 90 tablet 3  . losartan (COZAAR) 100 MG tablet TAKE 1 TABLET BY MOUTH EVERY DAY IN THE MORNING 90 tablet 3  . metoprolol tartrate (LOPRESSOR) 25 MG tablet TAKE 1 TABLET BY MOUTH 2 TIMES DAILY. 60 tablet 2  . warfarin (COUMADIN) 5 MG tablet TAKE AS DIRECTED BY COUMADIN CLINIC 180 tablet 1   No current facility-administered medications for this encounter.     REVIEW OF SYSTEMS:  On review of systems, the patient reports that he is doing well overall. He denies any chest pain, shortness of breath, cough, fevers, chills, night sweats, or unintended weight changes. He denies any bowel disturbances, and denies abdominal pain, nausea or vomiting. He denies any new musculoskeletal or joint aches or pains. His IPSS was 4, indicating mild urinary symptoms. He denies dysuria, hematuria, leakage or incontinence. He is not currently sexually active. A complete review of systems is obtained and is otherwise negative.    PHYSICAL EXAM:  Wt Readings from Last 3 Encounters:  07/06/17 (!) 342 lb 9.6 oz (155.4 kg)  06/02/17 (!) 349 lb (158.3 kg)  05/19/17 (!) 351 lb 12.8 oz (159.6 kg)   Temp Readings from Last 3 Encounters:  07/06/17 98.3 F (36.8 C) (Oral)  09/29/14 97.8 F (36.6 C) (Oral)  09/21/14 98.1 F (36.7 C) (Oral)   BP Readings from Last 3 Encounters:  07/06/17 118/82  06/10/17 128/84  06/02/17 (!) 168/90   Pulse Readings from Last 3 Encounters:    07/06/17 (!) 56  06/10/17 (!) 52  06/02/17 62   Pain Assessment Pain Score: 0-No pain/10  In general this is a well appearing African-American man in no acute distress. He is alert and oriented x4 and appropriate throughout the examination. HEENT reveals that the patient is normocephalic, atraumatic. Skin is intact without any evidence of gross lesions. Cardiovascular exam reveals a regular rate and rhythm, no clicks rubs or murmurs are auscultated. Chest is clear to auscultation bilaterally. Lymphatic assessment is performed and does not reveal any adenopathy in the cervical, supraclavicular, axillary, or inguinal chains.   KPS = 100  100 - Normal; no complaints; no evidence of disease. 90   - Able to carry on normal activity; minor signs or symptoms of disease. 80   - Normal activity with effort; some signs or symptoms of disease. 74   - Cares for self; unable to carry on normal activity or to do active work. 60   - Requires occasional assistance, but is able to care for most of his personal needs. 50   - Requires considerable assistance and frequent medical care. 41   - Disabled; requires special care and assistance. 34   - Severely disabled; hospital admission is indicated although death not imminent. 4   - Very sick; hospital admission necessary; active supportive treatment necessary. 10   - Moribund; fatal processes progressing rapidly. 0     - Dead  Karnofsky DA, Abelmann [place], Craver LS and Burchenal [facility] [phone]) The use of the nitrogen mustards in the palliative treatment of carcinoma: with particular reference to bronchogenic carcinoma Cancer 1 634-56  LABORATORY DATA:  Lab Results  Component Value Date   WBC 6.2 05/19/2017   HGB 13.1 05/19/2017   HCT 39.5 05/19/2017   MCV 89 05/19/2017   PLT 272 05/19/2017   Lab Results  Component Value Date   NA 137 06/10/2017   K 4.5 06/10/2017   CL 98 06/10/2017   CO2 25 06/10/2017   Lab Results  Component Value Date   ALT 14  11/04/2012   AST 16 11/04/2012   ALKPHOS 63 11/04/2012   BILITOT 0.6 11/04/2012     RADIOGRAPHY: Nm Bone Scan Whole Body  Result Date: 07/02/2017 CLINICAL DATA:  Prostate cancer, PSA 7.65 on 01/19/2017 EXAM: NUCLEAR MEDICINE WHOLE BODY BONE SCAN TECHNIQUE: Whole body anterior and posterior images were obtained approximately 3 hours after intravenous injection of radiopharmaceutical. RADIOPHARMACEUTICALS:  20.8 mCi Technetium-75m MDP IV COMPARISON:  None Radiographic correlation:  CT abdomen pelvis 07/02/2017 FINDINGS: Uptake at the shoulders, wrists, knees and feet, typically degenerative. No sites of abnormal osseous tracer accumulation are identified which are suspicious for osseous metastatic disease from prostate cancer. Small focus of nonspecific increased soft tissue localization of tracer at the medial proximal RIGHT calf, of uncertain etiology. Expected urinary tract and soft tissue distribution of tracer otherwise seen. IMPRESSION: No scintigraphic evidence of osseous metastatic disease. Small focus of nonspecific increased tracer localization at the soft tissues of the proximal medial RIGHT calf. Electronically Signed   By: Lavonia Dana M.D.   On: 07/02/2017 19:02      IMPRESSION/PLAN: 1. 67 y.o. gentleman with Stage T1c, high risk, adenocarcinoma of the prostate with Gleason Score of 4+5, and PSA of 7.65. We discussed the patient's workup and outlines the nature of prostate cancer in this setting. The patient's Gleason's score puts him into the high risk group. Accordingly, he is eligible for several treatment options including 8 weeks of external radiation or brachytherapy boost followed by 5 weeks of external radiation. We discussed the available radiation techniques, and focused on the details and logistics and delivery. We discussed and outlined the risks, benefits, short and long-term effects associated with radiotherapy and compared and contrasted these with prostatectomy. We discussed the  role of SpaceOAR in reducing the rectal toxicity associated with radiotherapy. We also detailed the role of ADT in the treatment of high-risk prostate cancer and outlined the associated side effects that could be expected with this therapy and the duration of 2 years of therapy. We discussed the delivery and logistics of treatment, and he  is interested in combination therapy of ADT with seed implant as a boost and 5 weeks of external radiotherapy.      Carola Rhine, PAC    Tyler Pita, MD  [facility] Direct Dial: [phone]  Fax: [phone] [email or web address]  Skype  [email or web address]    Page Me      This document serves as a record of services personally performed by Tyler Pita, MD and Shona Simpson, PA-C. It was created on their behalf by Rae Lips, a trained medical scribe. The creation of this record is based on the scribe's personal observations and the providers' statements to them. This document has been checked and approved by the attending providers.

## 2017-07-06 NOTE — Progress Notes (Signed)
See progress note under physician encounter. 

## 2017-07-06 NOTE — Progress Notes (Addendum)
GU Location of Tumor / Histology: prostatic adenocarcinoma  If Prostate Cancer, Gleason Score is (4 + 5) and PSA is (7.65). Prostate volume: 32.8 grams.  Alec Hunt was referred by Dr. Jacelyn Grip to Dr. Alyson Ingles in October 2018 for further evaluation of an elevated PSA.   Biopsies of prostate (if applicable) revealed:    Past/Anticipated interventions by urology, if any: biopsy, bone scan (negative), referral to radiation oncology. Patient reports he has not received ADT but has an appointment with Dr. Alyson Ingles 07/23/17.  Past/Anticipated interventions by medical oncology, if any: no  Weight changes, if any: no  Bowel/Bladder complaints, if any: IPSS 4. Denies dysuria, hematuria, leakage, or incontinence.   Nausea/Vomiting, if any: no  Pain issues, if any:  no  SAFETY ISSUES:  Prior radiation? no  Pacemaker/ICD? no  Possible current pregnancy? no  Is the patient on methotrexate? no  Current Complaints / other details:  67 year old male. Married. Retired from [employer].

## 2017-07-16 ENCOUNTER — Telehealth: Payer: Self-pay | Admitting: Cardiology

## 2017-07-16 ENCOUNTER — Telehealth: Payer: Self-pay | Admitting: *Deleted

## 2017-07-16 NOTE — Telephone Encounter (Signed)
New Message      [facility] Pre-operative Risk Assessment    Request for surgical clearance:  1. What type of surgery is being performed? Prostate implant   2. When is this surgery scheduled? Not scheduled needs the cardiac clearance   3. Are there any medications that need to be held prior to surgery and how long? Aspirin 5-7 days  4. Practice name and name of physician performing [facility]? [facility]   5. What is your office phone and fax number? Office phone number [phone] fax number [phone] attention [place]   6. Anesthesia type (None, local, MAC, general) ? Local    Alec Hunt 07/16/2017, 3:08 PM  _________________________________________________________________   (provider comments below)

## 2017-07-16 NOTE — Telephone Encounter (Signed)
CALLED PATIENT TO INFORM THAT  DR. Theodosia Blender OFFICE TO CALL HIM AND  AN ARRANGE AN  APPT. FOR CARDIAC CLEARANCE, SPOKE WITH PATIENT AND HE IS AWARE THIS WILL HAPPEN

## 2017-07-16 NOTE — Telephone Encounter (Signed)
I called pt- no change since we saw him in December- no chest pain or unusual dyspnea. He is cleared for surgery without further cardiac workup. OK to hold Coumadin 5 days pre op as he did before his prostate biopsy.  Kerin Ransom PA-C 07/16/2017 4:49 PM

## 2017-07-17 NOTE — Telephone Encounter (Signed)
Pt takes warfarin not aspirin for afib with CHADS2VASc score of 2 (age, HTN). Ok to hold warfarin for 5 days prior without Lovenox bridge. Clearance routed to below number.

## 2017-07-27 NOTE — Progress Notes (Signed)
Cardiology Office Note:    Date:  07/28/2017   ID:  Alec Hunt, DOB 1950-11-02, MRN [id number]  PCP:  Vernie Shanks, MD  Cardiologist:  No primary care provider on file.    Referring MD: Gavin Pound, MD   Chief Complaint  Patient presents with  . Atrial Fibrillation  . Sleep Apnea  . Hypertension    History of Present Illness:    Alec Hunt is a 67 y.o. male with a hx of persistent atrial fibrillation, OSA on BiPAP, HTN, second degree type I Mobitz heart block, RBBB, history of syncope in setting of afib and morbid obesity.  His last echo showed normal LVF with EF 60-65% with G2DD and mildly dilated ascending aorta.  He has a history of bradycardia and earlier this year his Cardizem was decreased due to HRs in the 40's.    He is here today for followup and is doing well.  He denies any chest pain or pressure, SOB, DOE, PND, orthopnea, LE edema, dizziness, palpitations or syncope. He is compliant with his meds and is tolerating meds with no SE. He is doing well with his BiPAP device.  He tolerates the mask and feels the pressure is adequate.  Since going on CPAP he feels rested in the am and has no significant daytime sleepiness.  He denies any significant mouth or nasal dryness or nasal congestion.  He does not think that he snores.      Past Medical History:  Diagnosis Date  . Arthritis    kness & shoulders  . Bradycardia   . Hyperglycemia   . Hypertension   . Mobitz type 1 second degree atrioventricular block   . Morbid obesity ([place])   . OSA (obstructive sleep apnea)    Severe w AHI 81.28/hr now on Bipap  . Persistent atrial fibrillation ([place])   . Prostate cancer ([place])   . RBBB   . Syncope    history of in the setting of afib    Past Surgical History:  Procedure Laterality Date  . CARDIAC CATHETERIZATION    . COLONOSCOPY    . knee arthroscopic    . LUMBAR LAMINECTOMY/DECOMPRESSION MICRODISCECTOMY N/A 09/27/2014   Procedure: LUMBAR  LAMINECTOMY/DECOMPRESSION MICRODISCECTOMY 3 LEVELS L4-5 L3-4 POSSIBLE L2-3 BILATERAL FORAMINOTOMIES AT L-3,4 AND 5;  Surgeon: Susa Day, MD;  Location: [facility];  Service: Orthopedics;  Laterality: N/A;  . PROSTATE BIOPSY    . right rotator cuff surgery      2015    Current Medications: Current Meds  Medication Sig  . diltiazem (CARDIZEM CD) 120 MG 24 hr capsule Take 1 capsule (120 mg total) daily by mouth.  . hydrochlorothiazide (HYDRODIURIL) 25 MG tablet Take 1 tablet (25 mg total) by mouth daily.  Marland Kitchen losartan (COZAAR) 100 MG tablet TAKE 1 TABLET BY MOUTH EVERY DAY IN THE MORNING  . metoprolol tartrate (LOPRESSOR) 25 MG tablet TAKE 1 TABLET BY MOUTH 2 TIMES DAILY.  Marland Kitchen warfarin (COUMADIN) 5 MG tablet TAKE AS DIRECTED BY COUMADIN CLINIC     Allergies:   Patient has no known allergies.   Social History   Socioeconomic History  . Marital status: Married    Spouse name: None  . Number of children: 2  . Years of education: None  . Highest education level: None  Social Needs  . Financial resource strain: None  . Food insecurity - worry: None  . Food insecurity - inability: None  . Transportation needs - medical: None  .  Transportation needs - non-medical: None  Occupational History  . Occupation: retired    Fish farm manager: [employer]  Tobacco Use  . Smoking status: Former Smoker    Packs/day: 0.25    Years: 15.00    Pack years: 3.75    Types: Cigarettes    Last attempt to quit: 02/03/2000    Years since quitting: 17.4  . Smokeless tobacco: Never Used  Substance and Sexual Activity  . Alcohol use: Yes    Comment: hx of ETOH use quit at age 30 was drinking beer  . Drug use: No  . Sexual activity: Not Currently  Other Topics Concern  . None  Social History Narrative   Married.    Retired from [employer] after 43 years.   3 sons but one passed from a MVA     Family History: The patient's family history includes CAD in his mother; Heart attack in his mother; Heart disease in  his mother; Hypertension in his brother. There is no history of Cancer.  ROS:   Please see the history of present illness.    ROS  All other systems reviewed and negative.   EKGs/Labs/Other Studies Reviewed:    The following studies were reviewed today: BiPAP download  EKG:  EKG is ordered today and showed sinus bradycardia at 45bpm with RBBB  Recent Labs: 05/19/2017: Hemoglobin 13.1; NT-Pro BNP 68; Platelets 272; TSH 1.960 06/10/2017: BUN 18; Creatinine, Ser 1.10; Potassium 4.5; Sodium 137   Recent Lipid Panel No results found for: CHOL, TRIG, HDL, CHOLHDL, VLDL, LDLCALC, LDLDIRECT  Physical Exam:    VS:  BP 134/76   Pulse (!) 51   Ht 6\' 1"  (1.854 m)   Wt (!) 340 lb 12.8 oz (154.6 kg)   SpO2 98%   BMI 44.96 kg/m     Wt Readings from Last 3 Encounters:  07/28/17 (!) 340 lb 12.8 oz (154.6 kg)  07/06/17 (!) 342 lb 9.6 oz (155.4 kg)  06/02/17 (!) 349 lb (158.3 kg)     GEN:  Well nourished, well developed in no acute distress HEENT: Normal NECK: No JVD; No carotid bruits LYMPHATICS: No lymphadenopathy CARDIAC: RRR, no murmurs, rubs, gallops RESPIRATORY:  Clear to auscultation without rales, wheezing or rhonchi  ABDOMEN: Soft, non-tender, non-distended MUSCULOSKELETAL:  No edema; No deformity  SKIN: Warm and dry NEUROLOGIC:  Alert and oriented x 3 PSYCHIATRIC:  Normal affect   ASSESSMENT:    1. Persistent atrial fibrillation ([place])   2. Essential hypertension   3. OSA (obstructive sleep apnea)   4. Morbid obesity ([place])   5. Bradycardia    PLAN:    In order of problems listed above:  1.  Persistent atrial fibrillation - he is maintaining sinus bradycardia on exam today.  He will continue on  Lopressor for suppression of afib and I am going to stop his Cardizem.  He is on warfarin followed in our coumadin clinic.  He denies any bleeding problems.   2.  HTN - his BP is well controlled on exam today.  He will continue on Losartan 100mg  daily,HCTZ 25mg  daily and  decrease Lopressor to 12.5mg  BID and start amlodipine 2.5mg  daily.  .  3.  OSA - the patient is tolerating PAP therapy well without any problems. The PAP download was reviewed today and showed an AHI of 5.1/hr on 21/15 cm H2O with 99% compliance in using more than 4 hours nightly.  The patient has been using and benefiting from PAP use and will continue  to benefit from therapy.   4 . Morbid obesity - I have encouraged him to get into a routine exercise program and cut back on carbs and portions.   5.  Bradycardia - He remains bradycardic at 45bpm by EKG.  I will stop his Cardizem and decrease Lopressor to 12.5mg  BID.  I will have him see the PA in 2 weeks   Medication Adjustments/Labs and Tests Ordered: Current medicines are reviewed at length with the patient today.  Concerns regarding medicines are outlined above.  No orders of the defined types were placed in this encounter.  No orders of the defined types were placed in this encounter.   Signed, Fransico Him, MD  07/28/2017 7:57 AM    [place]

## 2017-07-28 ENCOUNTER — Ambulatory Visit: Payer: Medicare Other | Admitting: Cardiology

## 2017-07-28 ENCOUNTER — Encounter: Payer: Self-pay | Admitting: Cardiology

## 2017-07-28 VITALS — BP 134/76 | HR 51 | Ht 73.0 in | Wt 340.8 lb

## 2017-07-28 DIAGNOSIS — I4819 Other persistent atrial fibrillation: Secondary | ICD-10-CM

## 2017-07-28 DIAGNOSIS — I481 Persistent atrial fibrillation: Secondary | ICD-10-CM

## 2017-07-28 DIAGNOSIS — I10 Essential (primary) hypertension: Secondary | ICD-10-CM

## 2017-07-28 DIAGNOSIS — G4733 Obstructive sleep apnea (adult) (pediatric): Secondary | ICD-10-CM

## 2017-07-28 DIAGNOSIS — R001 Bradycardia, unspecified: Secondary | ICD-10-CM | POA: Diagnosis not present

## 2017-07-28 MED ORDER — METOPROLOL TARTRATE 25 MG PO TABS: 12.5000 mg | ORAL_TABLET | Freq: Two times a day (BID) | ORAL | 2 refills | Status: DC

## 2017-07-28 MED ORDER — AMLODIPINE BESYLATE 2.5 MG PO TABS: 2.5000 mg | ORAL_TABLET | Freq: Every day | ORAL | 1 refills | Status: DC

## 2017-07-28 NOTE — Patient Instructions (Addendum)
Medication Instructions:  Your physician has recommended you make the following change in your medication:  STOP: Cardizem  DECREASE: Lopressor to 12.5 mg (0.5 tab) two times a day  START: amlodipine 2.5 mg once a day    Labwork: None ordered   Testing/Procedures: None ordered   Follow-Up: Your physician recommends that you schedule a follow-up appointment in: 2 weeks with PA.  Your physician wants you to follow-up in: 1 year with Dr. Radford Pax. You will receive a reminder letter in the mail two months in advance. If you don't receive a letter, please call our office to schedule the follow-up appointment.  Any Other Special Instructions Will Be Listed Below (If Applicable).  Your physician would like for you to check your blood pressure and heart rate daily for the next week. Call the office at [phone] with your recordings    How to Take Your Blood Pressure You can take your blood pressure at home with a machine. You may need to check your blood pressure at home:  To check if you have high blood pressure (hypertension).  To check your blood pressure over time.  To make sure your blood pressure medicine is working.  Supplies needed: You will need a blood pressure machine, or monitor. You can buy one at a drugstore or online. When choosing one:  Choose one with an arm cuff.  Choose one that wraps around your upper arm. Only one finger should fit between your arm and the cuff.  Do not choose one that measures your blood pressure from your wrist or finger.  Your doctor can suggest a monitor. How to prepare Avoid these things for 30 minutes before checking your blood pressure:  Drinking caffeine.  Drinking alcohol.  Eating.  Smoking.  Exercising.  Five minutes before checking your blood pressure:  Pee.  Sit in a dining chair. Avoid sitting in a soft couch or armchair.  Be quiet. Do not talk.  How to take your blood pressure Follow the instructions that  came with your machine. If you have a digital blood pressure monitor, these may be the instructions: 1. Sit up straight. 2. Place your feet on the floor. Do not cross your ankles or legs. 3. Rest your left arm at the level of your heart. You may rest it on a table, desk, or chair. 4. Pull up your shirt sleeve. 5. Wrap the blood pressure cuff around the upper part of your left arm. The cuff should be 1 inch (2.5 cm) above your elbow. It is best to wrap the cuff around bare skin. 6. Fit the cuff snugly around your arm. You should be able to place only one finger between the cuff and your arm. 7. Put the cord inside the groove of your elbow. 8. Press the power button. 9. Sit quietly while the cuff fills with air and loses air. 10. Write down the numbers on the screen. 11. Wait 2-3 minutes and then repeat steps 1-10.  What do the numbers mean? Two numbers make up your blood pressure. The first number is called systolic pressure. The second is called diastolic pressure. An example of a blood pressure reading is "120 over 80" (or 120/80). If you are an adult and do not have a medical condition, use this guide to find out if your blood pressure is normal: Normal  First number: below 120.  Second number: below 80. Elevated  First number: 120-129.  Second number: below 80. Hypertension stage 1  First number: 130-139.  Second number: 80-89. Hypertension stage 2  First number: 140 or above.  Second number: 85 or above. Your blood pressure is above normal even if only the top or bottom number is above normal. Follow these instructions at home:  Check your blood pressure as often as your doctor tells you to.  Take your monitor to your next doctor's appointment. Your doctor will: ? Make sure you are using it correctly. ? Make sure it is working right.  Make sure you understand what your blood pressure numbers should be.  Tell your doctor if your medicines are causing side  effects. Contact a doctor if:  Your blood pressure keeps being high. Get help right away if:  Your first blood pressure number is higher than 180.  Your second blood pressure number is higher than 120. This information is not intended to replace advice given to you by your health care provider. Make sure you discuss any questions you have with your health care provider. Document Released: 05/29/2008 Document Revised: 05/14/2016 Document Reviewed: 11/23/2015 Elsevier Interactive Patient Education  [employer].      If you need a refill on your cardiac medications before your next appointment, please call your pharmacy.

## 2017-07-30 ENCOUNTER — Telehealth: Payer: Self-pay | Admitting: Cardiology

## 2017-07-30 NOTE — Telephone Encounter (Signed)
Returned call to patient. Patient stated he was calling in his bp readings. Informed patient to call in with results for 1 week and that he doesn't have to call BP readings daily. Patient verbalized understanding and thanked me for the call.

## 2017-07-30 NOTE — Telephone Encounter (Signed)
New Message  Pt c/o BP issue:  1. What are your last 5 BP readings? (1/30) 129/83 HR 71 (1/31) 131/84 HR 64 2. Are you having any other symptoms (ex. Dizziness, headache, blurred vision, passed out)? Slight headache  3. What is your medication issue? None  Patient states that he was advised that he needed to call to give his bp readings.

## 2017-08-03 ENCOUNTER — Telehealth: Payer: Self-pay | Admitting: Cardiology

## 2017-08-03 NOTE — Telephone Encounter (Signed)
Returned call to patient. Patient stated he was calling to report blood pressure readings.   1/30 129/83 HR 71 1/31 131/84 HR 64 2/1 135/84  HR 65 2/2 131/84  HR 63  2/3 125/82  HR 75   Patient c/o of mild chest tightness this morning after eating breakfast. He states he had just finish eating and felt like it was more of indigestion verses chest pain. He stated the pain went away a few minutes after eating. Patient denies chest pain, dizziness, nausea, palpitations or syncope at the moment. Patient has a follow up appt with [place], [place] on 08/10/17. Informed patient I would forward to Dr. Radford Pax. Patient verbalized understanding and thanked me for the call.

## 2017-08-03 NOTE — Telephone Encounter (Signed)
BPs are good - no change in Rx.  CP sounds atypical and related to eating.  Continue with followup with PA in 1 week but if it occurs again he needs to call

## 2017-08-03 NOTE — Telephone Encounter (Signed)
New message   Patient calling to report  HR and a "little" chest tightness Please call  STAT if HR is under 50 or over 120 (normal HR is 60-100 beats per minute)  1) What is your heart rate? 71  2) Do you have a log of your heart rate readings (document readings)? 71,66, [id number]  3) Do you have any other symptoms? A "little" chest tightness    1. Are you having CP right now? A "little" per patient  2. Are you experiencing any other symptoms (ex. SOB, nausea, vomiting, sweating)? No  3. How long have you been experiencing CP? 5 days  4. Is your CP continuous or coming and going? Coming and going  5. Have you taken Nitroglycerin? No ?

## 2017-08-04 NOTE — Telephone Encounter (Signed)
Patient aware to continue to follow up appointment and call back if symptoms occur again. Patient verbalized understanding and thankful for the call.

## 2017-08-07 ENCOUNTER — Telehealth: Payer: Self-pay | Admitting: *Deleted

## 2017-08-07 NOTE — Telephone Encounter (Signed)
CALLED PATIENT TO REMIND OF CARDIAC CLEARANCE ON 08-10-17 @ 8:30 AM WITH DR. Fransico Him, SPOKE WITH PATIENT AND HE IS AWARE OF THIS APPT.

## 2017-08-10 ENCOUNTER — Encounter: Payer: Self-pay | Admitting: Cardiology

## 2017-08-10 ENCOUNTER — Ambulatory Visit (INDEPENDENT_AMBULATORY_CARE_PROVIDER_SITE_OTHER): Payer: Medicare Other

## 2017-08-10 ENCOUNTER — Encounter (INDEPENDENT_AMBULATORY_CARE_PROVIDER_SITE_OTHER): Payer: Self-pay

## 2017-08-10 ENCOUNTER — Ambulatory Visit: Payer: Medicare Other | Admitting: Cardiology

## 2017-08-10 VITALS — BP 104/60 | HR 51 | Resp 16 | Ht 73.0 in | Wt 340.8 lb

## 2017-08-10 DIAGNOSIS — R001 Bradycardia, unspecified: Secondary | ICD-10-CM | POA: Diagnosis not present

## 2017-08-10 DIAGNOSIS — I4819 Other persistent atrial fibrillation: Secondary | ICD-10-CM

## 2017-08-10 DIAGNOSIS — I4891 Unspecified atrial fibrillation: Secondary | ICD-10-CM

## 2017-08-10 DIAGNOSIS — I481 Persistent atrial fibrillation: Secondary | ICD-10-CM

## 2017-08-10 DIAGNOSIS — Z5181 Encounter for therapeutic drug level monitoring: Secondary | ICD-10-CM | POA: Diagnosis not present

## 2017-08-10 LAB — POCT INR: INR: 2.3

## 2017-08-10 NOTE — Patient Instructions (Signed)
Description   Continue on same dosage 7.5mg  daily except 10 mg on Mondays, Wednesdays, and Fridays. Recheck INR in 6 weeks. Call us with any medication changes or concerns # [phone] [facility]. Call us when your procedure date is scheduled-OK to hold 5 days prior to procedure.

## 2017-08-10 NOTE — Patient Instructions (Addendum)
Medication Instructions:  Your physician recommends that you continue on your current medications as directed. Please refer to the Current Medication list given to you today.   Labwork: None ordered  Testing/Procedures: None ordered  Follow-Up: Your physician wants you to follow-up in 1 year with Dr. Radford Pax.  You will receive a reminder letter in the mail two months in advance. If you don't receive a letter, please call our office to schedule the follow-up appointment.  Any Other Special Instructions Will Be Listed Below (If Applicable).  Monitor your blood pressure and heart rate at home at least twice weekly. If your systolic (top number) blood pressure drops below 100 or your pulse drops below 50, call our office for further instructions. [phone]   If you need a refill on your cardiac medications before your next appointment, please call your pharmacy.

## 2017-08-10 NOTE — Progress Notes (Signed)
08/10/2017 Alec Hunt   Dec 26, 1950  [id number]  Primary Physician Vernie Shanks, MD Primary Cardiologist: Dr. Radford Pax   Reason for Visit/CC: F/u for Bradycardia, Medication Monitoring   HPI:  Alec Hunt is a 67 y.o. male with a hx of persistent atrial fibrillation, OSA on BiPAP, HTN, second degree type I Mobitz heart block, RBBB, history of syncope in setting of afib and morbid obesity.  His last echo showed normal LVF with EF 60-65% with G2DD and mildly dilated ascending aorta.  He has a history of bradycardia and has required reduction in the doses of his rate control meds.   He was recently seen by Dr. Radford Pax 07/28/17 for his yearly f/u. He was asymptotic but noted to have significant bradycardia with rates in the mid 40s on EKG. He was ordered to stop Cardizem and to decrease Lopressor to 12. 5 mg BID. He now presents back for f/u to reassess rates.   He is here today with his wife. EKG shows sinus brady 51 bpm. He is completely asymptomatic. He denies feeling fatigue, tired, lightheaded or dizzy. No CP, dyspnea, syncope/ near syncope. He has been monitoring BP and HR at home closely. His HRs at home have been in the 60s, sometimes 70s.  BP has also been well controlled in the 120/80s, on average.   Current Meds  Medication Sig  . amLODipine (NORVASC) 2.5 MG tablet Take 1 tablet (2.5 mg total) by mouth daily.  . hydrochlorothiazide (HYDRODIURIL) 25 MG tablet Take 1 tablet (25 mg total) by mouth daily.  Marland Kitchen losartan (COZAAR) 100 MG tablet TAKE 1 TABLET BY MOUTH EVERY DAY IN THE MORNING  . metoprolol tartrate (LOPRESSOR) 25 MG tablet Take 0.5 tablets (12.5 mg total) by mouth 2 (two) times daily.  Marland Kitchen warfarin (COUMADIN) 5 MG tablet TAKE AS DIRECTED BY COUMADIN CLINIC   No Known Allergies Past Medical History:  Diagnosis Date  . Arthritis    kness & shoulders  . Bradycardia   . Hyperglycemia   . Hypertension   . Mobitz type 1 second degree atrioventricular block   . Morbid  obesity ([place])   . OSA (obstructive sleep apnea)    Severe w AHI 81.28/hr now on Bipap  . Persistent atrial fibrillation ([place])   . Prostate cancer ([place])   . RBBB   . Syncope    history of in the setting of afib   Family History  Problem Relation Age of Onset  . CAD Mother   . Heart attack Mother   . Heart disease Mother   . Hypertension Brother   . Cancer Neg Hx    Past Surgical History:  Procedure Laterality Date  . CARDIAC CATHETERIZATION    . COLONOSCOPY    . knee arthroscopic    . LUMBAR LAMINECTOMY/DECOMPRESSION MICRODISCECTOMY N/A 09/27/2014   Procedure: LUMBAR LAMINECTOMY/DECOMPRESSION MICRODISCECTOMY 3 LEVELS L4-5 L3-4 POSSIBLE L2-3 BILATERAL FORAMINOTOMIES AT L-3,4 AND 5;  Surgeon: Susa Day, MD;  Location: [facility];  Service: Orthopedics;  Laterality: N/A;  . PROSTATE BIOPSY    . right rotator cuff surgery      2015   Social History   Socioeconomic History  . Marital status: Married    Spouse name: Not on file  . Number of children: 2  . Years of education: Not on file  . Highest education level: Not on file  Social Needs  . Financial resource strain: Not on file  . Food insecurity - worry: Not on file  .  Food insecurity - inability: Not on file  . Transportation needs - medical: Not on file  . Transportation needs - non-medical: Not on file  Occupational History  . Occupation: retired    Fish farm manager: [employer]  Tobacco Use  . Smoking status: Former Smoker    Packs/day: 0.25    Years: 15.00    Pack years: 3.75    Types: Cigarettes    Last attempt to quit: 02/03/2000    Years since quitting: 17.5  . Smokeless tobacco: Never Used  Substance and Sexual Activity  . Alcohol use: Yes    Comment: hx of ETOH use quit at age 51 was drinking beer  . Drug use: No  . Sexual activity: Not Currently  Other Topics Concern  . Not on file  Social History Narrative   Married.    Retired from [employer] after 43 years.   3 sons but one passed from a MVA      Review of Systems: General: negative for chills, fever, night sweats or weight changes.  Cardiovascular: negative for chest pain, dyspnea on exertion, edema, orthopnea, palpitations, paroxysmal nocturnal dyspnea or shortness of breath Dermatological: negative for rash Respiratory: negative for cough or wheezing Urologic: negative for hematuria Abdominal: negative for nausea, vomiting, diarrhea, bright red blood per rectum, melena, or hematemesis Neurologic: negative for visual changes, syncope, or dizziness All other systems reviewed and are otherwise negative except as noted above.   Physical Exam:  Blood pressure 104/60, pulse (!) 51, resp. rate 16, height 6\' 1"  (1.854 m), weight (!) 340 lb 12.8 oz (154.6 kg), SpO2 99 %.  General appearance: alert, cooperative and moderately obese Neck: no carotid bruit and no JVD Lungs: clear to auscultation bilaterally Heart: RR, bradycardia Extremities: extremities normal, atraumatic, no cyanosis or edema Pulses: 2+ and symmetric Skin: Skin color, texture, turgor normal. No rashes or lesions Neurologic: Grossly normal  EKG sinus bradycardia -- personally reviewed   ASSESSMENT AND PLAN:   1. Bradycardia: still sinus brady but improved from the 40s to 50s after discontinuation of Cardizem and reduction of metoprolol. Pt is completely asymptomatic and rates at home have been in the 60s-70s. BP well controlled. Will continue low dose metoprolol for now given h/o afib. He will continue to monitor at home and will call our office if any HR <50 or if he develops symptoms.   2. PAF: sinus brady on EKG. Rate controlled with metoprolol. On Coumadin for a/c. INR checked in coumadin clinic today.   3. HTN: controlled on current regimen.   4.OSA: on CPAP. Followed by Dr. Radford Pax.   Follow-Up 1 year w/ Dr. Karie Kirks, MHS [facility] 08/10/2017 9:09 AM

## 2017-08-25 ENCOUNTER — Telehealth: Payer: Self-pay | Admitting: Cardiology

## 2017-08-25 NOTE — Telephone Encounter (Signed)
° °  [facility] Pre-operative Risk Assessment    Request for surgical clearance:  1. What type of surgery is being performed? Prostate implant  2. When is this surgery scheduled? Not scheduled yet,tentative based on clearance   3. What type of clearance is required (medical clearance vs. Pharmacy clearance to hold med vs. Both)? Medical clearance  4. Are there any medications that need to be held prior to surgery and how long? Not sure which medications may need to be held prior   5. Practice name and name of physician performing surgery? [facility]// Dr.Patrick McKenzie   6. What is your office phone and fax number? Office # [phone] Fax # [phone] "attn [place]"   7. Anesthesia type (None, local, MAC, general) ? General    Caryl Ada D [place] 08/25/2017, 1:25 PM  _________________________________________________________________   (provider comments below)

## 2017-08-26 NOTE — Telephone Encounter (Signed)
Forwarded to requesting party as requested via EPIC

## 2017-08-26 NOTE — Telephone Encounter (Signed)
Patient with diagnosis of atrial fibrillation on warfarin for anticoagulation.    Procedure: prostate implant Date of procedure: TBD  CHADS2-VASc score of  2 (HTN, AGE)  CrCl 142.5 Platelet count 272  Per office protocol, patient can hold warfarin  for 5 days prior to procedure.   Patient will not need bridging with Lovenox (enoxaparin) around procedure.  Patient should restart warfarin on the evening of procedure or day after, at discretion of procedure MD

## 2017-08-26 NOTE — Telephone Encounter (Signed)
   Primary Cardiologist: No primary care provider on file.  Chart reviewed as part of pre-operative protocol coverage. Given past medical history and time since last visit, based on ACC/AHA guidelines, Alec Hunt would be at acceptable risk for the planned procedure without further cardiovascular testing.   Will need to have Pharmacy give recommendations concerning holding his coumadin, timing and need to resume with follow up INR check appointment.   I will route this recommendation to the requesting party via Epic fax function and remove from pre-op pool.  Please call with questions.  Jory Sims DNP, ANP, AACC  08/26/2017, 2:18 PM

## 2017-08-28 ENCOUNTER — Other Ambulatory Visit: Payer: Self-pay | Admitting: Cardiology

## 2017-08-28 ENCOUNTER — Telehealth: Payer: Self-pay | Admitting: *Deleted

## 2017-08-28 ENCOUNTER — Other Ambulatory Visit: Payer: Self-pay | Admitting: *Deleted

## 2017-08-28 NOTE — Telephone Encounter (Signed)
CALLED PATIENT TO INFORM OF PRE-SEED PLANNING CT, LVM FOR A RETURN CALL. 

## 2017-08-31 ENCOUNTER — Telehealth: Payer: Self-pay | Admitting: *Deleted

## 2017-08-31 NOTE — Telephone Encounter (Signed)
Called patient to inform of implant date, spoke with patient and he is aware of this date. 

## 2017-09-01 ENCOUNTER — Telehealth: Payer: Self-pay | Admitting: Cardiology

## 2017-09-01 NOTE — Telephone Encounter (Signed)
° °  [facility] Pre-operative Risk Assessment    Request for surgical clearance:  1. What type of surgery is being performed? Brachy Therapy for Prostate Cancer   2. When is this surgery scheduled? 11-12-17   3. What type of clearance is required (medical clearance vs. Pharmacy clearance to hold med vs. Both)? General and Pharmacy Clearance Are there any medications that need to be held prior to surgery and how long?Can pt hold his Coumadin? If so,how long? 4. Practice name and name of physician performing surgery?Dr Nicolette Bang   5. What is your office phone and fax number? [phone] and fax is [phone]   6. Anesthesia type (None, local, MAC, general) ? General  Glyn Ade 09/01/2017, 10:42 AM  _________________________________________________________________   (provider comments below)

## 2017-09-02 NOTE — Telephone Encounter (Signed)
   Primary Cardiologist:Traci Turner, MD  Chart reviewed as part of pre-operative protocol coverage. Pre-op clearance as walready addressed by colleagues in earlier phone notes and reportedly faxed as well. To recap:  - Jory Sims NP reviewed chart as part of protocol. Per her input, "Given past medical history and time since last visit, based on ACC/AHA guidelines, Alec Hunt would be at acceptable risk for the planned procedure without further cardiovascular testing. "  - Per pharmacist [place], "Per office protocol, patient can hold warfarin for 5 days prior to procedure. Patient will not need bridging with Lovenox (enoxaparin) around procedure. Patient should restart warfarin on the evening of procedure or day after, at discretion of procedure MD."  RCRI calculated at 0.4%. I spoke with patient to make sure no interim issues and he confirms he's done well without any new anginal symptoms or cardiac complaints. Therefore the above clearance still holds. I did ask patient to notify our office of any concerns between now and surgery since this is still 2 months away. Will route this bundled recommendation to requesting provider via Epic fax function. Please call with questions.  [name], PA-C 09/02/2017, 3:13 PM

## 2017-09-07 ENCOUNTER — Other Ambulatory Visit: Payer: Self-pay | Admitting: Urology

## 2017-09-18 ENCOUNTER — Other Ambulatory Visit: Payer: Self-pay | Admitting: Urology

## 2017-09-18 DIAGNOSIS — C61 Malignant neoplasm of prostate: Secondary | ICD-10-CM

## 2017-09-21 ENCOUNTER — Ambulatory Visit (INDEPENDENT_AMBULATORY_CARE_PROVIDER_SITE_OTHER): Payer: Medicare Other | Admitting: *Deleted

## 2017-09-21 DIAGNOSIS — Z5181 Encounter for therapeutic drug level monitoring: Secondary | ICD-10-CM | POA: Diagnosis not present

## 2017-09-21 DIAGNOSIS — I4891 Unspecified atrial fibrillation: Secondary | ICD-10-CM

## 2017-09-21 DIAGNOSIS — I4819 Other persistent atrial fibrillation: Secondary | ICD-10-CM

## 2017-09-21 LAB — POCT INR: INR: 3

## 2017-09-21 NOTE — Patient Instructions (Addendum)
  Description   Continue on same dosage 7.5mg  daily except 10 mg on Mondays, Wednesdays, and Fridays. Take your last dose on 11/06/17 for procedure on 11/12/17. Restart when doctor doing procedure gives instructions to do so, restart with your normal dose. Recheck INR in 6 weeks. Call us with any medication changes or concerns # [phone] [facility].

## 2017-09-24 ENCOUNTER — Telehealth: Payer: Self-pay | Admitting: *Deleted

## 2017-09-24 ENCOUNTER — Other Ambulatory Visit (HOSPITAL_COMMUNITY): Payer: Self-pay | Admitting: *Deleted

## 2017-09-24 NOTE — Telephone Encounter (Signed)
CALLED PATIENT TO REMIND OF PRE-SEED APPTS. FOR 09-25-17, SPOKE WITH PATIENT AND HE IS AWARE OF THESE APPTS.

## 2017-09-25 ENCOUNTER — Ambulatory Visit
Admission: RE | Admit: 2017-09-25 | Discharge: 2017-09-25 | Disposition: A | Discharge status: Home / Self Care | Payer: Medicare Other | Source: Ambulatory Visit | Attending: Radiation Oncology | Admitting: Radiation Oncology

## 2017-09-25 ENCOUNTER — Ambulatory Visit (HOSPITAL_COMMUNITY)
Admission: RE | Admit: 2017-09-25 | Discharge: 2017-09-25 | Disposition: A | Discharge status: Home / Self Care | Payer: Medicare Other | Source: Ambulatory Visit | Attending: Urology | Admitting: Urology

## 2017-09-25 ENCOUNTER — Encounter (HOSPITAL_COMMUNITY)
Admission: RE | Admit: 2017-09-25 | Discharge: 2017-09-25 | Disposition: A | Discharge status: Home / Self Care | Payer: Medicare Other | Source: Ambulatory Visit | Attending: Urology | Admitting: Urology

## 2017-09-25 DIAGNOSIS — J9811 Atelectasis: Secondary | ICD-10-CM | POA: Insufficient documentation

## 2017-09-25 DIAGNOSIS — C61 Malignant neoplasm of prostate: Secondary | ICD-10-CM | POA: Diagnosis not present

## 2017-09-25 DIAGNOSIS — Z01818 Encounter for other preprocedural examination: Secondary | ICD-10-CM | POA: Insufficient documentation

## 2017-09-25 NOTE — Progress Notes (Signed)
  Radiation Oncology         [phone] ________________________________  Name: Alec Hunt MRN: [id number]  Date: 09/25/2017  DOB: April 18, 1951  SIMULATION AND TREATMENT PLANNING NOTE PUBIC ARCH STUDY  VQ:MGQQ, [name], MD  Vernie Shanks, MD  DIAGNOSIS: 67 y.o. 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 Malignant neoplasm of prostate ([place]) C61     COMPLEX SIMULATION:  The patient presented today for evaluation for possible prostate seed implant. He was brought to the radiation planning suite and placed supine on the CT couch. A 3-dimensional image study set was obtained in upload to the planning computer. There, on each axial slice, I contoured the prostate gland. Then, using three-dimensional radiation planning tools I reconstructed the prostate in view of the structures from the transperineal needle pathway to assess for possible pubic arch interference. In doing so, I did not appreciate any pubic arch interference. Also, the patient's prostate volume was estimated based on the drawn structure. The volume was 38 cc.  Given the pubic arch appearance and prostate volume, patient remains a good candidate to proceed with prostate seed implant. Today, he freely provided informed written consent to proceed.    PLAN: The patient will undergo prostate seed implant boost prior to IMRT for high risk disease.   ________________________________  Sheral Apley. Tammi Klippel, M.D.  This document serves as a record of services personally performed by Tyler Pita, MD. It was created on his behalf by Arlyce Harman, a trained medical scribe. The creation of this record is based on the scribe's personal observations and the provider's statements to them. This document has been checked and approved by the attending provider.

## 2017-10-29 ENCOUNTER — Other Ambulatory Visit: Payer: Self-pay | Admitting: Cardiology

## 2017-11-04 ENCOUNTER — Telehealth: Payer: Self-pay | Admitting: *Deleted

## 2017-11-04 NOTE — Telephone Encounter (Signed)
Called patient to remind of lab work for 11-05-17 - arrival time - 7:45 am @ [facility] Admitting, lvm for a return call

## 2017-11-06 ENCOUNTER — Encounter (HOSPITAL_COMMUNITY): Payer: Self-pay

## 2017-11-06 NOTE — Patient Instructions (Addendum)
Alec Hunt  11/06/2017   Your procedure is scheduled on:  11-12-2017  Report to [facility] Main  Entrance  to admitting at  7:30 AM    Call this number if you have problems the morning of surgery [phone]                 Remember: Do not eat food or drink liquids :After Midnight.    Bring CPAP mask and tubing   Take these medicines the morning of surgery with A SIP OF WATER:  Metoprolol, Amlodipine, Tylenol if needed                                You may not have any metal on your body including hair pins and              piercings  Do not wear jewelry, make-up, lotions, powders or perfumes, deodorant                          Men may shave face and neck.   Do not bring valuables to the hospital. [place].  Contacts, dentures or bridgework may not be worn into surgery.  Leave suitcase in the car. After surgery it may be brought to your room.     Patients discharged the day of surgery will not be allowed to drive home.  Name and phone number of your driver: wife-- Alec Hunt-- [phone]   Special Instructions:  [employer] AM day of surgery              Please read over the following fact sheets you were given: _____________________________________________________________________             [facility] - Preparing for Surgery Before surgery, you can play an important role.  Because skin is not sterile, your skin needs to be as free of germs as possible.  You can reduce the number of germs on your skin by washing with CHG (chlorahexidine gluconate) soap before surgery.  CHG is an antiseptic cleaner which kills germs and bonds with the skin to continue killing germs even after washing. Please DO NOT use if you have an allergy to CHG or antibacterial soaps.  If your skin becomes reddened/irritated stop using the CHG and inform your nurse when you arrive at Short Stay. Do not shave (including  legs and underarms) for at least 48 hours prior to the first CHG shower.  You may shave your face/neck. Please follow these instructions carefully:  1.  Shower with CHG Soap the night before surgery and the  morning of Surgery.  2.  If you choose to wash your hair, wash your hair first as usual with your  normal  shampoo.  3.  After you shampoo, rinse your hair and body thoroughly to remove the  shampoo.                           4.  Use CHG as you would any other liquid soap.  You can apply chg directly  to the skin and wash  Gently with a scrungie or clean washcloth.  5.  Apply the CHG Soap to your body ONLY FROM THE NECK DOWN.   Do not use on face/ open                           Wound or open sores. Avoid contact with eyes, ears mouth and genitals (private parts).                       Wash face,  Genitals (private parts) with your normal soap.             6.  Wash thoroughly, paying special attention to the area where your surgery  will be performed.  7.  Thoroughly rinse your body with warm water from the neck down.  8.  DO NOT shower/wash with your normal soap after using and rinsing off  the CHG Soap.                9.  Pat yourself dry with a clean towel.            10.  Wear clean pajamas.            11.  Place clean sheets on your bed the night of your first shower and do not  sleep with pets. Day of Surgery : Do not apply any lotions/deodorants the morning of surgery.  Please wear clean clothes to the hospital/surgery center.  FAILURE TO FOLLOW THESE INSTRUCTIONS MAY RESULT IN THE CANCELLATION OF YOUR SURGERY PATIENT SIGNATURE_________________________________  NURSE SIGNATURE__________________________________  ________________________________________________________________________

## 2017-11-09 ENCOUNTER — Encounter (HOSPITAL_COMMUNITY): Payer: Self-pay

## 2017-11-09 ENCOUNTER — Encounter (HOSPITAL_COMMUNITY)
Admission: RE | Admit: 2017-11-09 | Discharge: 2017-11-09 | Disposition: A | Discharge status: Home / Self Care | Payer: Medicare Other | Source: Ambulatory Visit | Attending: Urology | Admitting: Urology

## 2017-11-09 ENCOUNTER — Other Ambulatory Visit: Payer: Self-pay

## 2017-11-09 DIAGNOSIS — I10 Essential (primary) hypertension: Secondary | ICD-10-CM | POA: Diagnosis not present

## 2017-11-09 DIAGNOSIS — Z8249 Family history of ischemic heart disease and other diseases of the circulatory system: Secondary | ICD-10-CM | POA: Diagnosis not present

## 2017-11-09 DIAGNOSIS — I441 Atrioventricular block, second degree: Secondary | ICD-10-CM | POA: Diagnosis not present

## 2017-11-09 DIAGNOSIS — R351 Nocturia: Secondary | ICD-10-CM | POA: Diagnosis not present

## 2017-11-09 DIAGNOSIS — R001 Bradycardia, unspecified: Secondary | ICD-10-CM | POA: Diagnosis not present

## 2017-11-09 DIAGNOSIS — M19012 Primary osteoarthritis, left shoulder: Secondary | ICD-10-CM | POA: Diagnosis not present

## 2017-11-09 DIAGNOSIS — Z6841 Body Mass Index (BMI) 40.0 and over, adult: Secondary | ICD-10-CM | POA: Diagnosis not present

## 2017-11-09 DIAGNOSIS — Z87891 Personal history of nicotine dependence: Secondary | ICD-10-CM | POA: Diagnosis not present

## 2017-11-09 DIAGNOSIS — M19011 Primary osteoarthritis, right shoulder: Secondary | ICD-10-CM | POA: Diagnosis not present

## 2017-11-09 DIAGNOSIS — I481 Persistent atrial fibrillation: Secondary | ICD-10-CM | POA: Diagnosis not present

## 2017-11-09 DIAGNOSIS — G4733 Obstructive sleep apnea (adult) (pediatric): Secondary | ICD-10-CM | POA: Diagnosis not present

## 2017-11-09 DIAGNOSIS — C61 Malignant neoplasm of prostate: Secondary | ICD-10-CM | POA: Diagnosis not present

## 2017-11-09 DIAGNOSIS — I4510 Unspecified right bundle-branch block: Secondary | ICD-10-CM | POA: Diagnosis not present

## 2017-11-09 DIAGNOSIS — M170 Bilateral primary osteoarthritis of knee: Secondary | ICD-10-CM | POA: Diagnosis not present

## 2017-11-09 HISTORY — DX: Personal history of other specified conditions: Z87.898

## 2017-11-09 HISTORY — DX: Obstructive sleep apnea (adult) (pediatric): G47.33

## 2017-11-09 HISTORY — DX: Presence of spectacles and contact lenses: Z97.3

## 2017-11-09 HISTORY — DX: Nocturia: R35.1

## 2017-11-09 LAB — COMPREHENSIVE METABOLIC PANEL
ALBUMIN: 3.7 g/dL (ref 3.5–5.0)
ALK PHOS: 68 U/L (ref 38–126)
ALT: 21 U/L (ref 17–63)
ANION GAP: 11 (ref 5–15)
AST: 17 U/L (ref 15–41)
BILIRUBIN TOTAL: 0.4 mg/dL (ref 0.3–1.2)
BUN: 18 mg/dL (ref 6–20)
CALCIUM: 9.5 mg/dL (ref 8.9–10.3)
CO2: 23 mmol/L (ref 22–32)
CREATININE: 0.93 mg/dL (ref 0.61–1.24)
Chloride: 105 mmol/L (ref 101–111)
GFR calc Af Amer: >60 mL/min (ref >60)
GFR calc non Af Amer: >60 mL/min (ref >60)
GLUCOSE: 134 mg/dL — AB (ref 65–99)
Potassium: 3.9 mmol/L (ref 3.5–5.1)
Sodium: 139 mmol/L (ref 135–145)
Total Protein: 8.2 g/dL — ABNORMAL HIGH (ref 6.5–8.1)

## 2017-11-09 LAB — CBC
HEMATOCRIT: 35.1 % — AB (ref 39.0–52.0)
HEMOGLOBIN: 11.5 g/dL — AB (ref 13.0–17.0)
MCH: 29.6 pg (ref 26.0–34.0)
MCHC: 32.8 g/dL (ref 30.0–36.0)
MCV: 90.5 fL (ref 78.0–100.0)
Platelets: 249 10*3/uL (ref 150–400)
RBC: 3.88 MIL/uL — AB (ref 4.22–5.81)
RDW: 13 % (ref 11.5–15.5)
WBC: 6.3 10*3/uL (ref 4.0–10.5)

## 2017-11-09 LAB — APTT: APTT: 31 s (ref 24–36)

## 2017-11-09 LAB — PROTIME-INR
INR: 1.69
PROTHROMBIN TIME: 19.7 s — AB (ref 11.4–15.2)

## 2017-11-09 MED ORDER — FLEET ENEMA 7-19 GM/118ML RE ENEM
1.0000 | ENEMA | Freq: Once | RECTAL | Status: DC
  Filled 2017-11-09: qty 1

## 2017-11-09 NOTE — Progress Notes (Signed)
PT/INR results dated 11-09-2017 sent to dr Alyson Ingles in epic. EKG dated 08-10-2017 in epic. CXR dated 09-25-2017 in [facility]. Cardiac telephone clearance , dr Fransico Him, dated 09-01-2017 in [facility]. LOV note dated 08-10-2017 in epic.

## 2017-11-11 ENCOUNTER — Telehealth: Payer: Self-pay | Admitting: *Deleted

## 2017-11-11 MED ORDER — GENTAMICIN SULFATE 40 MG/ML IJ SOLN
500.0000 mg | Freq: Once | INTRAVENOUS | Status: AC
  Administered 2017-11-12: 500 mg via INTRAVENOUS
  Filled 2017-11-11: qty 12.5

## 2017-11-11 NOTE — Telephone Encounter (Signed)
CALLED PATIENT TO REMIND OF PROCEDURE FOR 11-12-17, LVM FOR A RETURN CALL

## 2017-11-12 ENCOUNTER — Encounter: Payer: Self-pay | Admitting: Medical Oncology

## 2017-11-12 ENCOUNTER — Ambulatory Visit (HOSPITAL_COMMUNITY): Payer: Medicare Other

## 2017-11-12 ENCOUNTER — Encounter (HOSPITAL_COMMUNITY): Payer: Self-pay | Admitting: *Deleted

## 2017-11-12 ENCOUNTER — Ambulatory Visit (HOSPITAL_COMMUNITY)
Admission: RE | Admit: 2017-11-12 | Discharge: 2017-11-12 | Disposition: A | Discharge status: Home / Self Care | Payer: Medicare Other | Source: Other Acute Inpatient Hospital | Attending: Urology | Admitting: Urology

## 2017-11-12 ENCOUNTER — Ambulatory Visit (HOSPITAL_COMMUNITY): Payer: Medicare Other | Admitting: Anesthesiology

## 2017-11-12 ENCOUNTER — Encounter (HOSPITAL_COMMUNITY)
Admission: RE | Disposition: A | Discharge status: Home / Self Care | Payer: Self-pay | Source: Other Acute Inpatient Hospital | Attending: Urology

## 2017-11-12 ENCOUNTER — Other Ambulatory Visit: Payer: Self-pay

## 2017-11-12 DIAGNOSIS — I441 Atrioventricular block, second degree: Secondary | ICD-10-CM | POA: Insufficient documentation

## 2017-11-12 DIAGNOSIS — R351 Nocturia: Secondary | ICD-10-CM | POA: Insufficient documentation

## 2017-11-12 DIAGNOSIS — M19012 Primary osteoarthritis, left shoulder: Secondary | ICD-10-CM | POA: Insufficient documentation

## 2017-11-12 DIAGNOSIS — Z87891 Personal history of nicotine dependence: Secondary | ICD-10-CM | POA: Insufficient documentation

## 2017-11-12 DIAGNOSIS — R001 Bradycardia, unspecified: Secondary | ICD-10-CM | POA: Insufficient documentation

## 2017-11-12 DIAGNOSIS — G4733 Obstructive sleep apnea (adult) (pediatric): Secondary | ICD-10-CM | POA: Insufficient documentation

## 2017-11-12 DIAGNOSIS — Z6841 Body Mass Index (BMI) 40.0 and over, adult: Secondary | ICD-10-CM | POA: Insufficient documentation

## 2017-11-12 DIAGNOSIS — Z8249 Family history of ischemic heart disease and other diseases of the circulatory system: Secondary | ICD-10-CM | POA: Insufficient documentation

## 2017-11-12 DIAGNOSIS — M170 Bilateral primary osteoarthritis of knee: Secondary | ICD-10-CM | POA: Insufficient documentation

## 2017-11-12 DIAGNOSIS — C61 Malignant neoplasm of prostate: Secondary | ICD-10-CM | POA: Diagnosis not present

## 2017-11-12 DIAGNOSIS — M19011 Primary osteoarthritis, right shoulder: Secondary | ICD-10-CM | POA: Diagnosis not present

## 2017-11-12 DIAGNOSIS — I10 Essential (primary) hypertension: Secondary | ICD-10-CM | POA: Insufficient documentation

## 2017-11-12 DIAGNOSIS — I4510 Unspecified right bundle-branch block: Secondary | ICD-10-CM | POA: Insufficient documentation

## 2017-11-12 DIAGNOSIS — I481 Persistent atrial fibrillation: Secondary | ICD-10-CM | POA: Insufficient documentation

## 2017-11-12 HISTORY — PX: CYSTOSCOPY: SHX5120

## 2017-11-12 HISTORY — PX: RADIOACTIVE SEED IMPLANT: SHX5150

## 2017-11-12 HISTORY — PX: SPACE OAR INSTILLATION: SHX6769

## 2017-11-12 SURGERY — INSERTION, RADIATION SOURCE, PROSTATE
Anesthesia: General

## 2017-11-12 MED ORDER — IOHEXOL 300 MG/ML  SOLN
INTRAMUSCULAR | Status: DC | PRN
  Administered 2017-11-12: 7 mL

## 2017-11-12 MED ORDER — DEXAMETHASONE SODIUM PHOSPHATE 10 MG/ML IJ SOLN
INTRAMUSCULAR | Status: DC | PRN
  Administered 2017-11-12: 10 mg via INTRAVENOUS

## 2017-11-12 MED ORDER — SUGAMMADEX SODIUM 200 MG/2ML IV SOLN
INTRAVENOUS | Status: AC
  Filled 2017-11-12: qty 2

## 2017-11-12 MED ORDER — SODIUM CHLORIDE 0.9 % IJ SOLN
INTRAMUSCULAR | Status: AC
  Filled 2017-11-12: qty 50

## 2017-11-12 MED ORDER — TRAMADOL HCL 50 MG PO TABS: 50.0000 mg | ORAL_TABLET | Freq: Four times a day (QID) | ORAL | 0 refills | Status: DC | PRN

## 2017-11-12 MED ORDER — FENTANYL CITRATE (PF) 250 MCG/5ML IJ SOLN
INTRAMUSCULAR | Status: AC
  Filled 2017-11-12: qty 5

## 2017-11-12 MED ORDER — ONDANSETRON HCL 4 MG/2ML IJ SOLN
INTRAMUSCULAR | Status: AC
  Filled 2017-11-12: qty 2

## 2017-11-12 MED ORDER — ROCURONIUM BROMIDE 10 MG/ML (PF) SYRINGE
PREFILLED_SYRINGE | INTRAVENOUS | Status: DC | PRN
  Administered 2017-11-12: 10 mg via INTRAVENOUS
  Administered 2017-11-12: 20 mg via INTRAVENOUS
  Administered 2017-11-12: 60 mg via INTRAVENOUS

## 2017-11-12 MED ORDER — OXYCODONE HCL 5 MG/5ML PO SOLN
5.0000 mg | Freq: Once | ORAL | Status: DC | PRN
  Filled 2017-11-12: qty 5

## 2017-11-12 MED ORDER — ONDANSETRON HCL 4 MG/2ML IJ SOLN: 4.0000 mg | Freq: Once | INTRAMUSCULAR | Status: DC | PRN

## 2017-11-12 MED ORDER — MIDAZOLAM HCL 2 MG/2ML IJ SOLN
INTRAMUSCULAR | Status: AC
  Filled 2017-11-12: qty 2

## 2017-11-12 MED ORDER — PROPOFOL 10 MG/ML IV BOLUS
INTRAVENOUS | Status: AC
  Filled 2017-11-12: qty 40

## 2017-11-12 MED ORDER — ONDANSETRON HCL 4 MG/2ML IJ SOLN
INTRAMUSCULAR | Status: DC | PRN
  Administered 2017-11-12: 4 mg via INTRAVENOUS

## 2017-11-12 MED ORDER — OXYCODONE HCL 5 MG PO TABS: 5.0000 mg | ORAL_TABLET | Freq: Once | ORAL | Status: DC | PRN

## 2017-11-12 MED ORDER — LABETALOL HCL 5 MG/ML IV SOLN
INTRAVENOUS | Status: DC | PRN
  Administered 2017-11-12: 2.5 mg via INTRAVENOUS
  Administered 2017-11-12: 5 mg via INTRAVENOUS

## 2017-11-12 MED ORDER — FENTANYL CITRATE (PF) 100 MCG/2ML IJ SOLN
25.0000 ug | INTRAMUSCULAR | Status: DC | PRN
  Administered 2017-11-12 (×2): 50 ug via INTRAVENOUS

## 2017-11-12 MED ORDER — LIDOCAINE 2% (20 MG/ML) 5 ML SYRINGE
INTRAMUSCULAR | Status: DC | PRN
  Administered 2017-11-12: 75 mg via INTRAVENOUS
  Administered 2017-11-12: 25 mg via INTRAVENOUS

## 2017-11-12 MED ORDER — SUCCINYLCHOLINE CHLORIDE 200 MG/10ML IV SOSY
PREFILLED_SYRINGE | INTRAVENOUS | Status: DC | PRN
  Administered 2017-11-12 (×2): 160 mg via INTRAVENOUS

## 2017-11-12 MED ORDER — SUGAMMADEX SODIUM 200 MG/2ML IV SOLN
INTRAVENOUS | Status: DC | PRN
  Administered 2017-11-12: 200 mg via INTRAVENOUS

## 2017-11-12 MED ORDER — STERILE WATER FOR IRRIGATION IR SOLN
Status: DC | PRN
  Administered 2017-11-12: 500 mL

## 2017-11-12 MED ORDER — FENTANYL CITRATE (PF) 100 MCG/2ML IJ SOLN
INTRAMUSCULAR | Status: DC | PRN
  Administered 2017-11-12: 150 ug via INTRAVENOUS
  Administered 2017-11-12: 50 ug via INTRAVENOUS
  Administered 2017-11-12 (×2): 100 ug via INTRAVENOUS

## 2017-11-12 MED ORDER — DEXAMETHASONE SODIUM PHOSPHATE 10 MG/ML IJ SOLN
INTRAMUSCULAR | Status: AC
  Filled 2017-11-12: qty 1

## 2017-11-12 MED ORDER — FENTANYL CITRATE (PF) 100 MCG/2ML IJ SOLN
INTRAMUSCULAR | Status: AC
  Filled 2017-11-12: qty 2

## 2017-11-12 MED ORDER — LACTATED RINGERS IV SOLN
INTRAVENOUS | Status: DC
  Administered 2017-11-12 (×2): via INTRAVENOUS

## 2017-11-12 MED ORDER — PROPOFOL 10 MG/ML IV BOLUS
INTRAVENOUS | Status: DC | PRN
  Administered 2017-11-12: 60 mg via INTRAVENOUS
  Administered 2017-11-12: 250 mg via INTRAVENOUS

## 2017-11-12 MED ORDER — MIDAZOLAM HCL 5 MG/5ML IJ SOLN
INTRAMUSCULAR | Status: DC | PRN
  Administered 2017-11-12 (×2): 2 mg via INTRAVENOUS

## 2017-11-12 SURGICAL SUPPLY — 19 items
BAG URINE DRAINAGE (UROLOGICAL SUPPLIES) ×4 IMPLANT
CATH FOLEY 2WAY SLVR  5CC 16FR (CATHETERS) ×4
CATH FOLEY 2WAY SLVR 5CC 16FR (CATHETERS) ×4 IMPLANT
CATH ROBINSON RED A/P 20FR (CATHETERS) ×4 IMPLANT
COVER BACK TABLE 60X90IN (DRAPES) ×4 IMPLANT
COVER MAYO STAND STRL (DRAPES) ×4 IMPLANT
COVER SURGICAL LIGHT HANDLE (MISCELLANEOUS) ×4 IMPLANT
DRSG TEGADERM 4X4.75 (GAUZE/BANDAGES/DRESSINGS) ×4 IMPLANT
DRSG TEGADERM 8X12 (GAUZE/BANDAGES/DRESSINGS) ×4 IMPLANT
GLOVE BIOGEL M STRL SZ7.5 (GLOVE) ×8 IMPLANT
GOWN STRL REUS W/TWL LRG LVL3 (GOWN DISPOSABLE) ×8 IMPLANT
HOLDER FOLEY CATH W/STRAP (MISCELLANEOUS) ×4 IMPLANT
IMPL SPACEOAR SYSTEM 10ML (MISCELLANEOUS) ×2 IMPLANT
IMPLANT SPACEOAR SYSTEM 10ML (MISCELLANEOUS) ×4
PACK CYSTO (CUSTOM PROCEDURE TRAY) ×4 IMPLANT
SUT BONE WAX W31G (SUTURE) ×4 IMPLANT
SYR 10ML LL (SYRINGE) ×4 IMPLANT
TOWEL OR 17X26 10 PK STRL BLUE (TOWEL DISPOSABLE) ×4 IMPLANT
UNDERPAD 30X30 (UNDERPADS AND DIAPERS) ×4 IMPLANT

## 2017-11-12 NOTE — Op Note (Signed)
PRE-OPERATIVE DIAGNOSIS:  Adenocarcinoma of the prostate  POST-OPERATIVE DIAGNOSIS:  Same  PROCEDURE:  Procedure(s): 1. I-125 radioactive seed implantation 2. Cystoscopy 3. Placement of SpaceOAR  SURGEON:  Surgeon(s): Nicolette Bang, MD  Radiation oncologist: Dr. Tyler Pita  ANESTHESIA:  General  EBL:  Minimal  DRAINS: 43 French Foley catheter  INDICATION: Alec Hunt is a 66 year old with a history of T1c prostate cancer. After discussing treatment options he has elected to proceed with brachytherapy  Description of procedure: After informed consent the patient was brought to the major OR, placed on the table and administered general anesthesia. He was then moved to the modified lithotomy position with his perineum perpendicular to the floor. His perineum and genitalia were then sterilely prepped. An official timeout was then performed. A 16 French Foley catheter was then placed in the bladder and filled with dilute contrast, a rectal tube was placed in the rectum and the transrectal ultrasound probe was placed in the rectum and affixed to the stand. He was then sterilely draped.  Real time ultrasonography was used along with the seed planning software [employer] Prostate vs. 4.2.21. This was used to develop the seed plan including the number of needles as well as number of seeds required for complete and adequate coverage. Real-time ultrasonography was then used along with the previously developed plan and the Nucletron device to implant a total of 75 seeds using 20 needles. This proceeded without difficulty or complication.  We then proceeded to mix the SpaceOAR using the kit supplied from the manufacturer. Once this was complete we placed a sinal needle into the perirectal fat between the rectum and the prostate. Once this was accomplished we injected 2cc of normal saline to hydrodissect the plain. We then instilled the the SpaceOAR through the spinal needle and noted good  distribution in the perirectal fat.    A Foley catheter was then removed as well as the transrectal ultrasound probe and rectal probe. Flexible cystoscopy was then performed using the 17 French flexible scope which revealed a normal urethra throughout its length down to the sphincter which appeared intact. The prostatic urethra revealed bilobar hypertrophy but no evidence of obstruction, seeds, spacers or lesions. The bladder was then entered and fully and systematically inspected. The ureteral orifices were noted to be of normal configuration and position. The mucosa revealed no evidence of tumors. There were also no stones identified within the bladder. I noted no seeds or spacers on the floor of the bladder and retroflexion of the scope revealed no seeds protruding from the base of the prostate.  The cystoscope was then removed and a new 63 French Foley catheter was then inserted and the balloon was filled with 10 cc of sterile water. This was connected to closed system drainage and the patient was awakened and taken to recovery room in stable and satisfactory condition. He tolerated procedure well and there were no intraoperative complications.

## 2017-11-12 NOTE — Anesthesia Postprocedure Evaluation (Signed)
Anesthesia Post Note  Patient: Alec Hunt  Procedure(s) Performed: RADIOACTIVE SEED IMPLANT/BRACHYTHERAPY IMPLANT (N/A ) SPACE OAR INSTILLATION (N/A ) CYSTOSCOPY FLEXIBLE     Patient location during evaluation: PACU Anesthesia Type: General Level of consciousness: awake and alert Pain management: pain level controlled Vital Signs Assessment: post-procedure vital signs reviewed and stable Respiratory status: spontaneous breathing, nonlabored ventilation, respiratory function stable and patient connected to nasal cannula oxygen Cardiovascular status: blood pressure returned to baseline and stable Postop Assessment: no apparent nausea or vomiting Anesthetic complications: no    Last Vitals:  Vitals:   11/12/17 1245 11/12/17 1255  BP: (!) 145/84 (!) 150/90  Pulse: 65 67  Resp: 18 20  Temp: 36.9 C 36.9 C  SpO2: 95% 97%    Last Pain:  Vitals:   11/12/17 1245  TempSrc:   PainSc: 2                  Sidi Dzikowski COKER

## 2017-11-12 NOTE — Discharge Instructions (Signed)
Indwelling Urinary Catheter Care, Adult  Take good care of your catheter to keep it working and to prevent problems.  How to wear your catheter  Attach your catheter to your leg with tape (adhesive tape) or a leg strap. Make sure it is not too tight. If you use tape, remove any bits of tape that are already on the catheter.  How to wear a drainage bag  You should have:   A large overnight bag.   A small leg bag.    Overnight Bag  You may wear the overnight bag at any time. Always keep the bag below the level of your bladder but off the floor. When you sleep, put a clean plastic bag in a wastebasket. Then hang the bag inside the wastebasket.  Leg Bag  Never wear the leg bag at night. Always wear the leg bag below your knee. Keep the leg bag secure with a leg strap or tape.  How to care for your skin   Clean the skin around the catheter at least once every day.   Shower every day. Do not take baths.   Put creams, lotions, or ointments on your genital area only as told by your doctor.   Do not use powders, sprays, or lotions on your genital area.  How to clean your catheter and your skin  1. Wash your hands with soap and water.  2. Wet a washcloth in warm water and gentle (mild) soap.  3. Use the washcloth to clean the skin where the catheter enters your body. Clean downward and wipe away from the catheter in small circles. Do not wipe toward the catheter.  4. Pat the area dry with a clean towel. Make sure to clean off all soap.  How to care for your drainage bags  Empty your drainage bag when it is ?- full or at least 2-3 times a day. Replace your drainage bag once a month or sooner if it starts to smell bad or look dirty. Do not clean your drainage bag unless told by your doctor.  Emptying a drainage bag    Supplies Needed   Rubbing alcohol.   Gauze pad or cotton ball.   Tape or a leg strap.    Steps  1. Wash your hands with soap and water.  2. Separate (detach) the bag from your leg.  3. Hold the bag over  the toilet or a clean container. Keep the bag below your hips and bladder. This stops pee (urine) from going back into the tube.  4. Open the pour spout at the bottom of the bag.  5. Empty the pee into the toilet or container. Do not let the pour spout touch any surface.  6. Put rubbing alcohol on a gauze pad or cotton ball.  7. Use the gauze pad or cotton ball to clean the pour spout.  8. Close the pour spout.  9. Attach the bag to your leg with tape or a leg strap.  10. Wash your hands.    Changing a drainage bag  Supplies Needed   Alcohol wipes.   A clean drainage bag.   Adhesive tape or a leg strap.    Steps  1. Wash your hands with soap and water.  2. Separate the dirty bag from your leg.  3. Pinch the rubber catheter with your fingers so that pee does not spill out.  4. Separate the catheter tube from the drainage tube where these tubes connect (at the   connection valve). Do not let the tubes touch any surface.  5. Clean the end of the catheter tube with an alcohol wipe. Use a different alcohol wipe to clean the end of the drainage tube.  6. Connect the catheter tube to the drainage tube of the clean bag.  7. Attach the new bag to the leg with adhesive tape or a leg strap.  8. Wash your hands.    How to prevent infection and other problems   Never pull on your catheter or try to remove it. Pulling can damage tissue in your body.   Always wash your hands before and after touching your catheter.   If a leg strap gets wet, replace it with a dry one.   Drink enough fluids to keep your pee clear or pale yellow, or as told by your doctor.   Do not let the drainage bag or tubing touch the floor.   Wear cotton underwear.   If you are male, wipe from front to back after you poop (have a bowel movement).   Check on the catheter often to make sure it works and the tubing is not twisted.  Get help if:   Your pee is cloudy.   Your pee smells unusually bad.   Your pee is not draining into the bag.   Your  tube gets clogged.   Your catheter starts to leak.   Your bladder feels full.  Get help right away if:   You have redness, swelling, or pain where the catheter enters your body.   You have fluid, pus, or a bad smell coming from the area where the catheter enters your body.   The area where the catheter enters your body feels warm.   You have a fever.   You have pain in your:  ? Stomach (abdomen).  ? Legs.  ? Lower back.  ? Bladder.   You see blood fill the catheter.   Your pee is pink or red.   You feel sick to your stomach (nauseous).   You throw up (vomit).   You have chills.   Your catheter gets pulled out.  This information is not intended to replace advice given to you by your health care provider. Make sure you discuss any questions you have with your health care provider.  Document Released: 10/11/2012 Document Revised: 05/14/2016 Document Reviewed: 11/29/2013  Elsevier Interactive Patient Education  2018 [employer]

## 2017-11-12 NOTE — Anesthesia Procedure Notes (Signed)
Procedure Name: Intubation Date/Time: 11/12/2017 10:01 AM Performed by: Lissa Morales, CRNA Pre-anesthesia Checklist: Patient identified, Emergency Drugs available, Suction available and Patient being monitored Patient Re-evaluated:Patient Re-evaluated prior to induction Oxygen Delivery Method: Circle system utilized Preoxygenation: Pre-oxygenation with 100% oxygen Induction Type: IV induction Ventilation: Mask ventilation without difficulty Laryngoscope Size: Mac and 4 Tube type: Oral Tube size: 8.0 mm Number of attempts: 2 Airway Equipment and Method: Stylet and Oral airway Placement Confirmation: ETT inserted through vocal cords under direct vision,  positive ETCO2 and breath sounds checked- equal and bilateral Secured at: 22 cm Tube secured with: Tape Dental Injury: Teeth and Oropharynx as per pre-operative assessment  Comments: 1st pass esophageal  Head repositioned easy intubation with MAC 4 blade

## 2017-11-12 NOTE — Progress Notes (Signed)
  Radiation Oncology         [phone] ________________________________  Name: Alec Hunt MRN: [id number]  Date: 11/12/2017  DOB: [birth year]       Prostate Seed Implant  BS:JGGE, [name], MD  No ref. provider found  DIAGNOSIS: 67 y.o. gentleman with Stage T1c, high risk, adenocarcinoma of the prostate with Gleason Score of 4+5, and PSA of 7.65    ICD-10-CM   1. Malignant neoplasm of prostate [facility]) [place] Discharge patient    PROCEDURE: Insertion of radioactive I-125 seeds into the prostate gland.  RADIATION DOSE: 110 Gy, boost therapy.  TECHNIQUE: Alec Hunt was brought to the operating room with the urologist. He was placed in the dorsolithotomy position. He was catheterized and a rectal tube was inserted. The perineum was shaved, prepped and draped. The ultrasound probe was then introduced into the rectum to see the prostate gland.  TREATMENT DEVICE: A needle grid was attached to the ultrasound probe stand and anchor needles were placed.  3D PLANNING: The prostate was imaged in 3D using a sagittal sweep of the prostate probe. These images were transferred to the planning computer. There, the prostate, urethra and rectum were defined on each axial reconstructed image. Then, the software created an optimized 3D plan and a few seed positions were adjusted. The quality of the plan was reviewed using [facility] information for the target and the following two organs at risk:  Urethra and Rectum.  Then the accepted plan was uploaded to the seed Selectron afterloading unit.  PROSTATE VOLUME STUDY:  Using transrectal ultrasound the volume of the prostate was verified to be 40.7 cc.  SPECIAL TREATMENT PROCEDURE/SUPERVISION AND HANDLING: The Nucletron FIRST system was used to place the needles under sagittal guidance. A total of 20 needles were used to deposit 75 seeds in the prostate gland. The individual seed activity was 0.336 mCi.  SpaceOAR:  Yes  COMPLEX SIMULATION: At the end of  the procedure, an anterior radiograph of the pelvis was obtained to document seed positioning and count. Cystoscopy was performed to check the urethra and bladder.  MICRODOSIMETRY: At the end of the procedure, the patient was emitting 0.032 mR/hr at 1 meter. Accordingly, he was considered safe for hospital discharge.  PLAN: The patient will return to the radiation oncology clinic for post implant CT dosimetry in three weeks.   ________________________________  Sheral Apley Tammi Klippel, M.D.

## 2017-11-12 NOTE — Transfer of Care (Signed)
Immediate Anesthesia Transfer of Care Note  Patient: Alec Hunt  Procedure(s) Performed: RADIOACTIVE SEED IMPLANT/BRACHYTHERAPY IMPLANT (N/A ) SPACE OAR INSTILLATION (N/A )  Patient Location: PACU  Anesthesia Type:General  Level of Consciousness: awake, alert , oriented and patient cooperative  Airway & Oxygen Therapy: Patient Spontanous Breathing and Patient connected to face mask oxygen  Post-op Assessment: Report given to RN, Post -op Vital signs reviewed and stable and Patient moving all extremities X 4  Post vital signs: stable  Last Vitals:  Vitals Value Taken Time  BP 150/107 11/12/2017 11:53 AM  Temp    Pulse 62 11/12/2017 11:57 AM  Resp 6 11/12/2017 11:57 AM  SpO2 99 % 11/12/2017 11:57 AM  Vitals shown include unvalidated device data.  Last Pain:  Vitals:   11/12/17 1154  TempSrc:   PainSc: (P) 6          Complications: No apparent anesthesia complications

## 2017-11-12 NOTE — Anesthesia Preprocedure Evaluation (Signed)
Anesthesia Evaluation  Patient identified by MRN, date of birth, ID band Patient awake    Reviewed: Allergy & Precautions, NPO status , Patient's Chart, lab work & pertinent test results  Airway Mallampati: III  TM Distance: >3 FB Neck ROM: Full    Dental  (+) Teeth Intact, Dental Advisory Given   Pulmonary former smoker,    breath sounds clear to auscultation       Cardiovascular hypertension,  Rhythm:Regular Rate:Normal     Neuro/Psych    GI/Hepatic   Endo/Other    Renal/GU      Musculoskeletal   Abdominal (+) + obese,   Peds  Hematology   Anesthesia Other Findings   Reproductive/Obstetrics                             Anesthesia Physical Anesthesia Plan  ASA: III  Anesthesia Plan: General   Post-op Pain Management:    Induction: Intravenous  PONV Risk Score and Plan: Ondansetron and Dexamethasone  Airway Management Planned: Oral ETT  Additional Equipment:   Intra-op Plan:   Post-operative Plan: Extubation in OR  Informed Consent: I have reviewed the patients History and Physical, chart, labs and discussed the procedure including the risks, benefits and alternatives for the proposed anesthesia with the patient or authorized representative who has indicated his/her understanding and acceptance.   Dental advisory given  Plan Discussed with: CRNA and Anesthesiologist  Anesthesia Plan Comments:         Anesthesia Quick Evaluation

## 2017-11-12 NOTE — H&P (Signed)
Urology Admission H&P  Chief Complaint: prostate cancer  History of Present Illness: Alec Hunt is a 67yo with a hx of t1c prostate cancer here for brachytherapy and SpaceOAR. No new LUTS. No hematuria or dysuria  Past Medical History:  Diagnosis Date  . Arthritis    kness & shoulders  . Bradycardia   . History of syncope 10/2013   in setting new dx atrial fib  . Hypertension   . Mobitz type 1 second degree atrioventricular block   . Morbid obesity ([place])   . Nocturia   . OSA treated with BiPAP    Severe w AHI 81.28/hr now on Bipap  . Persistent atrial fibrillation [facility])    cardiologist-  dr Tressia Miners turner-- dx 2014  . Prostate cancer [facility]) urologist-  dr Alyson Ingles  oncologist-  dr Tammi Klippel   dx 06-11-2017--- Stage T1c, Gleason 4+5, PSA 7.65, vol 32.8cc--  scheduled for radiactive seed implants 11-12-2017  . RBBB   . Wears glasses    Past Surgical History:  Procedure Laterality Date  . CARDIAC CATHETERIZATION  1990s  dr t. turner   per pt told normal coronaries  . CARDIOVASCULAR STRESS TEST  06/16/2012   Low risk nuclear study/  normal LV function and wall motion , ef 61%  . CARDIOVASCULAR STRESS TEST  06/16/2012   Low risk nuclear study w/ no ischemia/ normal LV function and wall motion , ef 61%  . COLONOSCOPY  2012 approx.  Marland Kitchen KNEE ARTHROSCOPY Right 1990s  . LUMBAR LAMINECTOMY/DECOMPRESSION MICRODISCECTOMY N/A 09/27/2014   Procedure: LUMBAR LAMINECTOMY/DECOMPRESSION MICRODISCECTOMY 3 LEVELS L4-5 L3-4 POSSIBLE L2-3 BILATERAL FORAMINOTOMIES AT L-3,4 AND 5;  Surgeon: Susa Day, MD;  Location: [facility] ORS;  Service: Orthopedics;  Laterality: N/A;  . PROSTATE BIOPSY  06-11-2017   dr Alyson Ingles office  . ROTATOR CUFF REPAIR Right 2015  . TRANSTHORACIC ECHOCARDIOGRAM  05/26/2017   mild LVH, ef 93-23%, grade 2 diastolic dysfunction/  mild dilaged ascending aorta/  trivial PR and TR/  mild RAE  . TRANSTHORACIC ECHOCARDIOGRAM     mild LVH, ef 60-65%, grade 2 diastoli dysfunction/ mild  dilated ascending aorta/  trivil PR and TR/  mild RAE    Home Medications:  Current Facility-Administered Medications  Medication Dose Route Frequency Provider Last Rate Last Dose  . gentamicin (GARAMYCIN) 500 mg in dextrose 5 % 100 mL IVPB  500 mg Intravenous Once Cleon Gustin, MD      . lactated ringers infusion   Intravenous Continuous Ellender, Karyl Kinnier, MD 20 mL/hr at 11/12/17 0757     Allergies: No Known Allergies  Family History  Problem Relation Age of Onset  . CAD Mother   . Heart attack Mother   . Heart disease Mother   . Hypertension Brother   . Cancer Neg Hx    Social History:  reports that he quit smoking about 17 years ago. His smoking use included cigarettes. He has a 3.75 pack-year smoking history. He has never used smokeless tobacco. He reports that he drank alcohol. He reports that he does not use drugs.  Review of Systems  All other systems reviewed and are negative.   Physical Exam:  Vital signs in last 24 hours: Temp:  [98 F (36.7 C)] 98 F (36.7 C) (05/16 0737) Pulse Rate:  [55] 55 (05/16 0737) Resp:  [18] 18 (05/16 0737) BP: (152)/(80) 152/80 (05/16 0737) SpO2:  [100 %] 100 % (05/16 0737) Physical Exam  Constitutional: He is oriented to person, place, and time. He appears  well-developed and well-nourished.  HENT:  Head: Normocephalic and atraumatic.  Eyes: Pupils are equal, round, and reactive to light. EOM are normal.  Neck: Normal range of motion. No thyromegaly present.  Cardiovascular: Normal rate and regular rhythm.  Respiratory: Effort normal. No respiratory distress.  GI: Soft. He exhibits no distension.  Musculoskeletal: Normal range of motion. He exhibits no edema.  Neurological: He is alert and oriented to person, place, and time.  Skin: Skin is warm and dry.  Psychiatric: He has a normal mood and affect. His behavior is normal. Judgment and thought content normal.    Laboratory Data:  No results found for this or any previous  visit (from the past 24 hour(s)). No results found for this or any previous visit (from the past 240 hour(s)). Creatinine: Recent Labs    11/09/17 0850  CREATININE 0.93   Baseline Creatinine: 0.9  Impression/Assessment:  66yo with prostate cancer  Plan:  The risks/benefits/alternatives to Brachytherapy with Space OAR was explained to the patient and he understands and wishes to proceed with surgery  Nicolette Bang 11/12/2017, 9:37 AM

## 2017-11-17 ENCOUNTER — Telehealth: Payer: Self-pay | Admitting: *Deleted

## 2017-11-17 NOTE — Telephone Encounter (Signed)
Pt called to inform us that he has not restarted his Warfarin to date, he stated his MD did not instruct him to restart and he needs to cancel his warfarin appt. Since pt hasn't restarted Coumadin at this time will cancel the appt.  Asked pt if he knew the reason why he wasn't restarted and he stated he wasn't sure as he is having some bleeding in his catheter. Pt states he went on yesterday to have to ask the MD but his MD was not in. Therefore, advised pt to give them a call and inquire as this is important. Pt states he will call them now and then get back with [place] with updated information. Pt states he is not having any problems at this time besides the current blood in his catheter. Advised that getting back on the Warfarin is important as he has been off awhile and just had the procedure on 11/12/17, educated on the risks of clots and stroke. Pt will call back with information after he calls the MD office.

## 2017-11-24 ENCOUNTER — Telehealth: Payer: Self-pay | Admitting: *Deleted

## 2017-11-24 NOTE — Telephone Encounter (Signed)
Called patient to remind of post seed appts. and MRI for 11-25-17, spoke with patient and he is aware of these appts.

## 2017-11-25 ENCOUNTER — Ambulatory Visit
Admission: RE | Admit: 2017-11-25 | Discharge: 2017-11-25 | Disposition: A | Discharge status: Home / Self Care | Payer: Medicare Other | Source: Ambulatory Visit | Attending: Radiation Oncology | Admitting: Radiation Oncology

## 2017-11-25 ENCOUNTER — Ambulatory Visit: Payer: Self-pay | Admitting: Radiation Oncology

## 2017-11-25 ENCOUNTER — Ambulatory Visit (HOSPITAL_COMMUNITY)
Admission: RE | Admit: 2017-11-25 | Discharge: 2017-11-25 | Disposition: A | Discharge status: Home / Self Care | Payer: Medicare Other | Source: Ambulatory Visit | Attending: Urology | Admitting: Urology

## 2017-11-25 DIAGNOSIS — C61 Malignant neoplasm of prostate: Secondary | ICD-10-CM | POA: Insufficient documentation

## 2017-11-25 DIAGNOSIS — Z510 Encounter for antineoplastic radiation therapy: Secondary | ICD-10-CM | POA: Diagnosis not present

## 2017-11-25 NOTE — Progress Notes (Signed)
  Radiation Oncology         [phone] ________________________________  Name: Alec Hunt MRN: [id number]  Date: 11/25/2017  DOB: Feb 23, 1951  COMPLEX SIMULATION NOTE  NARRATIVE:  The patient was brought to the [facility] today following prostate seed implantation approximately one month ago.  Identity was confirmed.  All relevant records and images related to the planned course of therapy were reviewed.  Then, the patient was set-up supine.  CT images were obtained.  The CT images were loaded into the planning software.  Then the prostate and rectum were contoured.  Treatment planning then occurred.  The implanted iodine 125 seeds were identified by the physics staff for projection of radiation distribution  I have requested : 3D Simulation  I have requested a DVH of the following structures: Prostate and rectum.    ________________________________  Sheral Apley Tammi Klippel, M.D.  This document serves as a record of services personally performed by Tyler Pita, MD. It was created on his behalf by Rae Lips, a trained medical scribe. The creation of this record is based on the scribe's personal observations and the provider's statements to them. This document has been checked and approved by the attending provider.

## 2017-11-25 NOTE — Progress Notes (Signed)
  Radiation Oncology         [phone] ________________________________  Name: Alec Hunt MRN: [id number]  Date: 11/25/2017  DOB: 1950/11/04  SIMULATION AND TREATMENT PLANNING NOTE    ICD-10-CM   1. Malignant neoplasm of prostate ([place]) C61     DIAGNOSIS:  67 y.o. 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  Identity was confirmed.  All relevant records and images related to the planned course of therapy were reviewed.  The patient freely provided informed written consent to proceed with treatment after reviewing the details related to the planned course of therapy. The consent form was witnessed and verified by the simulation staff.  Then, the patient was set-up in a stable reproducible supine position for radiation therapy.  A vacuum lock pillow device was custom fabricated to position his legs in a reproducible immobilized position.  Then, I performed a urethrogram under sterile conditions to identify the prostatic apex.  CT images were obtained.  Surface markings were placed.  The CT images were loaded into the planning software.  Then the prostate target and avoidance structures including the rectum, bladder, bowel and hips were contoured.  Treatment planning then occurred.  The radiation prescription was entered and confirmed.  A total of one complex treatment devices were fabricated. I have requested : Intensity Modulated Radiotherapy (IMRT) is medically necessary for this case for the following reason:  Rectal sparing.Marland Kitchen  PLAN:  The patient will receive 45 Gy in 25 fractions of 1.8 Gy, to supplement an up-front prostate seed implant boost of 110 Gy to achieve a total nominal dose of 165 Gy.  ________________________________  Sheral Apley Tammi Klippel, M.D.

## 2017-11-26 ENCOUNTER — Ambulatory Visit: Payer: Medicare Other | Admitting: *Deleted

## 2017-11-26 DIAGNOSIS — Z5181 Encounter for therapeutic drug level monitoring: Secondary | ICD-10-CM | POA: Diagnosis not present

## 2017-11-26 DIAGNOSIS — I4891 Unspecified atrial fibrillation: Secondary | ICD-10-CM | POA: Diagnosis not present

## 2017-11-26 DIAGNOSIS — I481 Persistent atrial fibrillation: Secondary | ICD-10-CM

## 2017-11-26 DIAGNOSIS — I4819 Other persistent atrial fibrillation: Secondary | ICD-10-CM

## 2017-11-26 LAB — POCT INR: INR: 1.6 — AB (ref 2.0–3.0)

## 2017-11-26 NOTE — Patient Instructions (Signed)
Description   Today take 10mg , then Continue on same dosage 7.5mg  daily except 10 mg on Mondays, Wednesdays, and Fridays. Recheck INR in 2 weeks. Call us with any medication changes or concerns # [phone] [facility].

## 2017-11-30 ENCOUNTER — Encounter: Payer: Self-pay | Admitting: Radiation Oncology

## 2017-11-30 DIAGNOSIS — G4733 Obstructive sleep apnea (adult) (pediatric): Secondary | ICD-10-CM | POA: Diagnosis not present

## 2017-11-30 DIAGNOSIS — I481 Persistent atrial fibrillation: Secondary | ICD-10-CM | POA: Insufficient documentation

## 2017-11-30 DIAGNOSIS — Z510 Encounter for antineoplastic radiation therapy: Secondary | ICD-10-CM | POA: Diagnosis not present

## 2017-11-30 DIAGNOSIS — I10 Essential (primary) hypertension: Secondary | ICD-10-CM | POA: Diagnosis not present

## 2017-11-30 DIAGNOSIS — Z87891 Personal history of nicotine dependence: Secondary | ICD-10-CM | POA: Insufficient documentation

## 2017-11-30 DIAGNOSIS — Z8249 Family history of ischemic heart disease and other diseases of the circulatory system: Secondary | ICD-10-CM | POA: Diagnosis not present

## 2017-11-30 DIAGNOSIS — I4510 Unspecified right bundle-branch block: Secondary | ICD-10-CM | POA: Diagnosis not present

## 2017-11-30 DIAGNOSIS — C61 Malignant neoplasm of prostate: Secondary | ICD-10-CM | POA: Insufficient documentation

## 2017-12-02 DIAGNOSIS — Z510 Encounter for antineoplastic radiation therapy: Secondary | ICD-10-CM | POA: Diagnosis not present

## 2017-12-07 ENCOUNTER — Ambulatory Visit
Admission: RE | Admit: 2017-12-07 | Discharge: 2017-12-07 | Disposition: A | Discharge status: Home / Self Care | Payer: Medicare Other | Source: Ambulatory Visit | Attending: Radiation Oncology | Admitting: Radiation Oncology

## 2017-12-07 DIAGNOSIS — Z510 Encounter for antineoplastic radiation therapy: Secondary | ICD-10-CM | POA: Diagnosis not present

## 2017-12-08 ENCOUNTER — Ambulatory Visit
Admission: RE | Admit: 2017-12-08 | Discharge: 2017-12-08 | Disposition: A | Discharge status: Home / Self Care | Payer: Medicare Other | Source: Ambulatory Visit | Attending: Radiation Oncology | Admitting: Radiation Oncology

## 2017-12-08 DIAGNOSIS — Z510 Encounter for antineoplastic radiation therapy: Secondary | ICD-10-CM | POA: Diagnosis not present

## 2017-12-09 ENCOUNTER — Ambulatory Visit
Admission: RE | Admit: 2017-12-09 | Discharge: 2017-12-09 | Disposition: A | Discharge status: Home / Self Care | Payer: Medicare Other | Source: Ambulatory Visit | Attending: Radiation Oncology | Admitting: Radiation Oncology

## 2017-12-09 ENCOUNTER — Ambulatory Visit: Payer: Medicare Other | Admitting: *Deleted

## 2017-12-09 DIAGNOSIS — Z510 Encounter for antineoplastic radiation therapy: Secondary | ICD-10-CM | POA: Diagnosis not present

## 2017-12-09 DIAGNOSIS — I4891 Unspecified atrial fibrillation: Secondary | ICD-10-CM | POA: Diagnosis not present

## 2017-12-09 DIAGNOSIS — I4819 Other persistent atrial fibrillation: Secondary | ICD-10-CM

## 2017-12-09 DIAGNOSIS — I481 Persistent atrial fibrillation: Secondary | ICD-10-CM | POA: Diagnosis not present

## 2017-12-09 DIAGNOSIS — Z5181 Encounter for therapeutic drug level monitoring: Secondary | ICD-10-CM | POA: Diagnosis not present

## 2017-12-09 LAB — POCT INR: INR: 3 (ref 2.0–3.0)

## 2017-12-09 NOTE — Patient Instructions (Signed)
Description    Continue on same dosage 7.5mg  daily except 10 mg on Mondays, Wednesdays, and Fridays. Recheck INR in 3 weeks. Call us with any medication changes or concerns # [phone] [facility].

## 2017-12-10 ENCOUNTER — Ambulatory Visit
Admission: RE | Admit: 2017-12-10 | Discharge: 2017-12-10 | Disposition: A | Discharge status: Home / Self Care | Payer: Medicare Other | Source: Ambulatory Visit | Attending: Radiation Oncology | Admitting: Radiation Oncology

## 2017-12-10 DIAGNOSIS — Z510 Encounter for antineoplastic radiation therapy: Secondary | ICD-10-CM | POA: Diagnosis not present

## 2017-12-11 ENCOUNTER — Ambulatory Visit
Admission: RE | Admit: 2017-12-11 | Discharge: 2017-12-11 | Disposition: A | Discharge status: Home / Self Care | Payer: Medicare Other | Source: Ambulatory Visit | Attending: Radiation Oncology | Admitting: Radiation Oncology

## 2017-12-11 DIAGNOSIS — Z510 Encounter for antineoplastic radiation therapy: Secondary | ICD-10-CM | POA: Diagnosis not present

## 2017-12-12 NOTE — Progress Notes (Signed)
  Radiation Oncology         [phone] ________________________________  Name: Alec Hunt MRN: [id number]  Date: 11/30/2017  DOB: January 21, 1951  3D Planning Note   Prostate Brachytherapy Post-Implant Dosimetry  Diagnosis: 67 y.o. 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 He underwent CT scan complex simulation to delineate the three-dimensional structures of the pelvis and demonstrate the radiation distribution.  Since that time, the seed localization, and complex isodose planning with dose volume histograms have now been completed.  Results:   Prostate Coverage - The dose of radiation delivered to the 90% or more of the prostate gland (D90) was 123.63% of the prescription dose. This exceeds our goal of greater than 90%. Rectal Sparing - The volume of rectal tissue receiving the prescription dose or higher was 0.0 cc. This falls under our thresholds tolerance of 1.0 cc.  Impression: The prostate seed implant appears to show adequate target coverage and appropriate rectal sparing.  Plan:  The patient will continue to follow with urology for ongoing PSA determinations. I would anticipate a high likelihood for local tumor control with minimal risk for rectal morbidity.  ________________________________  Sheral Apley Tammi Klippel, M.D.

## 2017-12-14 ENCOUNTER — Ambulatory Visit
Admission: RE | Admit: 2017-12-14 | Discharge: 2017-12-14 | Disposition: A | Discharge status: Home / Self Care | Payer: Medicare Other | Source: Ambulatory Visit | Attending: Radiation Oncology | Admitting: Radiation Oncology

## 2017-12-14 DIAGNOSIS — Z510 Encounter for antineoplastic radiation therapy: Secondary | ICD-10-CM | POA: Diagnosis not present

## 2017-12-15 ENCOUNTER — Ambulatory Visit
Admission: RE | Admit: 2017-12-15 | Discharge: 2017-12-15 | Disposition: A | Discharge status: Home / Self Care | Payer: Medicare Other | Source: Ambulatory Visit | Attending: Radiation Oncology | Admitting: Radiation Oncology

## 2017-12-15 DIAGNOSIS — Z510 Encounter for antineoplastic radiation therapy: Secondary | ICD-10-CM | POA: Diagnosis not present

## 2017-12-16 ENCOUNTER — Ambulatory Visit
Admission: RE | Admit: 2017-12-16 | Discharge: 2017-12-16 | Disposition: A | Discharge status: Home / Self Care | Payer: Medicare Other | Source: Ambulatory Visit | Attending: Radiation Oncology | Admitting: Radiation Oncology

## 2017-12-16 DIAGNOSIS — Z510 Encounter for antineoplastic radiation therapy: Secondary | ICD-10-CM | POA: Diagnosis not present

## 2017-12-17 ENCOUNTER — Ambulatory Visit
Admission: RE | Admit: 2017-12-17 | Discharge: 2017-12-17 | Disposition: A | Discharge status: Home / Self Care | Payer: Medicare Other | Source: Ambulatory Visit | Attending: Radiation Oncology | Admitting: Radiation Oncology

## 2017-12-17 DIAGNOSIS — Z510 Encounter for antineoplastic radiation therapy: Secondary | ICD-10-CM | POA: Diagnosis not present

## 2017-12-18 ENCOUNTER — Ambulatory Visit
Admission: RE | Admit: 2017-12-18 | Discharge: 2017-12-18 | Disposition: A | Discharge status: Home / Self Care | Payer: Medicare Other | Source: Ambulatory Visit | Attending: Radiation Oncology | Admitting: Radiation Oncology

## 2017-12-18 DIAGNOSIS — Z510 Encounter for antineoplastic radiation therapy: Secondary | ICD-10-CM | POA: Diagnosis not present

## 2017-12-21 ENCOUNTER — Ambulatory Visit
Admission: RE | Admit: 2017-12-21 | Discharge: 2017-12-21 | Disposition: A | Discharge status: Home / Self Care | Payer: Medicare Other | Source: Ambulatory Visit | Attending: Radiation Oncology | Admitting: Radiation Oncology

## 2017-12-21 DIAGNOSIS — Z510 Encounter for antineoplastic radiation therapy: Secondary | ICD-10-CM | POA: Diagnosis not present

## 2017-12-22 ENCOUNTER — Ambulatory Visit
Admission: RE | Admit: 2017-12-22 | Discharge: 2017-12-22 | Disposition: A | Discharge status: Home / Self Care | Payer: Medicare Other | Source: Ambulatory Visit | Attending: Radiation Oncology | Admitting: Radiation Oncology

## 2017-12-22 DIAGNOSIS — Z510 Encounter for antineoplastic radiation therapy: Secondary | ICD-10-CM | POA: Diagnosis not present

## 2017-12-23 ENCOUNTER — Ambulatory Visit
Admission: RE | Admit: 2017-12-23 | Discharge: 2017-12-23 | Disposition: A | Discharge status: Home / Self Care | Payer: Medicare Other | Source: Ambulatory Visit | Attending: Radiation Oncology | Admitting: Radiation Oncology

## 2017-12-23 DIAGNOSIS — Z510 Encounter for antineoplastic radiation therapy: Secondary | ICD-10-CM | POA: Diagnosis not present

## 2017-12-24 ENCOUNTER — Ambulatory Visit
Admission: RE | Admit: 2017-12-24 | Discharge: 2017-12-24 | Disposition: A | Discharge status: Home / Self Care | Payer: Medicare Other | Source: Ambulatory Visit | Attending: Radiation Oncology | Admitting: Radiation Oncology

## 2017-12-24 DIAGNOSIS — Z510 Encounter for antineoplastic radiation therapy: Secondary | ICD-10-CM | POA: Diagnosis not present

## 2017-12-25 ENCOUNTER — Ambulatory Visit
Admission: RE | Admit: 2017-12-25 | Discharge: 2017-12-25 | Disposition: A | Discharge status: Home / Self Care | Payer: Medicare Other | Source: Ambulatory Visit | Attending: Radiation Oncology | Admitting: Radiation Oncology

## 2017-12-25 DIAGNOSIS — Z510 Encounter for antineoplastic radiation therapy: Secondary | ICD-10-CM | POA: Diagnosis not present

## 2017-12-28 ENCOUNTER — Ambulatory Visit
Admission: RE | Admit: 2017-12-28 | Discharge: 2017-12-28 | Disposition: A | Discharge status: Home / Self Care | Payer: Medicare Other | Source: Ambulatory Visit | Attending: Radiation Oncology | Admitting: Radiation Oncology

## 2017-12-28 DIAGNOSIS — C61 Malignant neoplasm of prostate: Secondary | ICD-10-CM | POA: Diagnosis present

## 2017-12-28 DIAGNOSIS — Z510 Encounter for antineoplastic radiation therapy: Secondary | ICD-10-CM | POA: Insufficient documentation

## 2017-12-29 ENCOUNTER — Ambulatory Visit
Admission: RE | Admit: 2017-12-29 | Discharge: 2017-12-29 | Disposition: A | Discharge status: Home / Self Care | Payer: Medicare Other | Source: Ambulatory Visit | Attending: Radiation Oncology | Admitting: Radiation Oncology

## 2017-12-29 DIAGNOSIS — C61 Malignant neoplasm of prostate: Secondary | ICD-10-CM | POA: Diagnosis not present

## 2017-12-30 ENCOUNTER — Ambulatory Visit
Admission: RE | Admit: 2017-12-30 | Discharge: 2017-12-30 | Disposition: A | Discharge status: Home / Self Care | Payer: Medicare Other | Source: Ambulatory Visit | Attending: Radiation Oncology | Admitting: Radiation Oncology

## 2017-12-30 DIAGNOSIS — C61 Malignant neoplasm of prostate: Secondary | ICD-10-CM | POA: Diagnosis not present

## 2018-01-01 ENCOUNTER — Ambulatory Visit
Admission: RE | Admit: 2018-01-01 | Discharge: 2018-01-01 | Disposition: A | Discharge status: Home / Self Care | Payer: Medicare Other | Source: Ambulatory Visit | Attending: Radiation Oncology | Admitting: Radiation Oncology

## 2018-01-01 DIAGNOSIS — C61 Malignant neoplasm of prostate: Secondary | ICD-10-CM | POA: Diagnosis not present

## 2018-01-04 ENCOUNTER — Ambulatory Visit
Admission: RE | Admit: 2018-01-04 | Discharge: 2018-01-04 | Disposition: A | Discharge status: Home / Self Care | Payer: Medicare Other | Source: Ambulatory Visit | Attending: Radiation Oncology | Admitting: Radiation Oncology

## 2018-01-04 DIAGNOSIS — C61 Malignant neoplasm of prostate: Secondary | ICD-10-CM | POA: Diagnosis not present

## 2018-01-05 ENCOUNTER — Ambulatory Visit
Admission: RE | Admit: 2018-01-05 | Discharge: 2018-01-05 | Disposition: A | Discharge status: Home / Self Care | Payer: Medicare Other | Source: Ambulatory Visit | Attending: Radiation Oncology | Admitting: Radiation Oncology

## 2018-01-05 DIAGNOSIS — C61 Malignant neoplasm of prostate: Secondary | ICD-10-CM | POA: Diagnosis not present

## 2018-01-06 ENCOUNTER — Ambulatory Visit
Admission: RE | Admit: 2018-01-06 | Discharge: 2018-01-06 | Disposition: A | Discharge status: Home / Self Care | Payer: Medicare Other | Source: Ambulatory Visit | Attending: Radiation Oncology | Admitting: Radiation Oncology

## 2018-01-06 DIAGNOSIS — C61 Malignant neoplasm of prostate: Secondary | ICD-10-CM | POA: Diagnosis not present

## 2018-01-07 ENCOUNTER — Ambulatory Visit
Admission: RE | Admit: 2018-01-07 | Discharge: 2018-01-07 | Disposition: A | Discharge status: Home / Self Care | Payer: Medicare Other | Source: Ambulatory Visit | Attending: Radiation Oncology | Admitting: Radiation Oncology

## 2018-01-07 DIAGNOSIS — C61 Malignant neoplasm of prostate: Secondary | ICD-10-CM | POA: Diagnosis not present

## 2018-01-08 ENCOUNTER — Ambulatory Visit
Admission: RE | Admit: 2018-01-08 | Discharge: 2018-01-08 | Disposition: A | Discharge status: Home / Self Care | Payer: Medicare Other | Source: Ambulatory Visit | Attending: Radiation Oncology | Admitting: Radiation Oncology

## 2018-01-08 ENCOUNTER — Ambulatory Visit: Payer: Medicare Other

## 2018-01-08 DIAGNOSIS — C61 Malignant neoplasm of prostate: Secondary | ICD-10-CM | POA: Diagnosis not present

## 2018-01-11 ENCOUNTER — Ambulatory Visit
Admission: RE | Admit: 2018-01-11 | Discharge: 2018-01-11 | Disposition: A | Discharge status: Home / Self Care | Payer: Medicare Other | Source: Ambulatory Visit | Attending: Radiation Oncology | Admitting: Radiation Oncology

## 2018-01-11 ENCOUNTER — Ambulatory Visit: Payer: Medicare Other

## 2018-01-11 ENCOUNTER — Encounter: Payer: Self-pay | Admitting: Radiation Oncology

## 2018-01-11 DIAGNOSIS — C61 Malignant neoplasm of prostate: Secondary | ICD-10-CM | POA: Diagnosis not present

## 2018-01-12 ENCOUNTER — Ambulatory Visit: Payer: Medicare Other

## 2018-01-13 NOTE — Progress Notes (Signed)
  Radiation Oncology         [phone] ________________________________  Name: Alec Hunt MRN: [id number]  Date: 01/11/2018  DOB: 1951/03/23  End of Treatment Note  Diagnosis:   67 y.o. male with Stage T1c, high risk,adenocarcinoma of the prostate with Gleason Score of 4+5, and PSA of7.65    Indication for treatment:  Curative, Definitive Radiotherapy       Radiation treatment dates:   12/07/2017 - 01/11/2018  Site/dose:   The prostate was treated to 45 Gy in 25 fractions of 1.8 Gy, to supplement an up-front prostate seed implant boost of 110 Gy to achieve a total nominal dose of 165 Gy.  Beams/energy:   The patient was treated with IMRT using volumetric arc therapy delivering 6 MV X-rays to clockwise and counterclockwise circumferential arcs with a 90 degree collimator offset to avoid dose scalloping.  Image guidance was performed with daily cone beam CT prior to each fraction to align to gold markers in the prostate and assure proper bladder and rectal fill volumes.  Immobilization was achieved with BodyFix custom mold.  Narrative: The patient tolerated radiation treatment relatively well. He experienced modest fatigue and some minor urinary irritation with frequency, urgency, incomplete emptying, and nocturia which was managed with Myrbetriq which was started during treatment. He denied any bowel issues related to treatment.  Plan: The patient has completed radiation treatment. He will return to radiation oncology clinic for routine followup in one month. I advised him to call or return sooner if he has any questions or concerns related to his recovery or treatment. ________________________________  Sheral Apley. Tammi Klippel, M.D.  This document serves as a record of services personally performed by Tyler Pita, MD. It was created on his behalf by Rae Lips, a trained medical scribe. The creation of this record is based on the scribe's personal observations and the provider's  statements to them. This document has been checked and approved by the attending provider.

## 2018-02-03 ENCOUNTER — Ambulatory Visit
Admission: RE | Admit: 2018-02-03 | Discharge: 2018-02-03 | Disposition: A | Discharge status: Home / Self Care | Payer: Medicare Other | Source: Ambulatory Visit | Attending: Urology | Admitting: Urology

## 2018-02-03 ENCOUNTER — Other Ambulatory Visit: Payer: Self-pay

## 2018-02-03 DIAGNOSIS — C61 Malignant neoplasm of prostate: Secondary | ICD-10-CM

## 2018-02-03 DIAGNOSIS — Z7901 Long term (current) use of anticoagulants: Secondary | ICD-10-CM | POA: Diagnosis not present

## 2018-02-03 DIAGNOSIS — Z923 Personal history of irradiation: Secondary | ICD-10-CM | POA: Diagnosis not present

## 2018-02-03 DIAGNOSIS — Z79899 Other long term (current) drug therapy: Secondary | ICD-10-CM | POA: Insufficient documentation

## 2018-02-03 NOTE — Progress Notes (Signed)
Radiation Oncology         [phone] ________________________________  Name: Alec Hunt MRN: [id number]  Date: 02/03/2018  DOB: 1951/06/13  Post Treatment Note  CC: Vernie Shanks, MD  Vernie Shanks, MD  Diagnosis:   67 y.o. male with Stage T1c, high risk,adenocarcinoma of the prostate with Gleason Score of 4+5, and PSA of7.65    Interval Since Last Radiation:  3 weeks  12/07/2017 - 01/11/2018:   The prostate was treated to 45 Gy in 25 fractions of 1.8 Gy, to supplement an up-front prostate seed implant boost of 110 Gy to achieve a total nominal dose of 165 Gy.  Narrative:  The patient returns today for routine follow-up accompanied by his wife.  He tolerated radiation treatment relatively well. He experienced modest fatigue and some minor urinary irritation with frequency, urgency, incomplete emptying, and nocturia which was managed with Myrbetriq which was started during treatment. He denied any bowel issues related to treatment.  He remained on ADT throughout his course of treatment.  On review of systems, the patient states continued increased daytime frequency, nocturia x5, straining to void, and feelings of incomplete emptying. He denies any dysuria, gross hematuria, pelvic or abdominal pain, N/V or diarrhea. Current IPSS is 19, indicating moderate symptoms. The patient is no longer taking Myrbetriq due to inability to empty his bladder while taking. His urologist switched him to a new medication, which he cannot recall the name of, but seems to improve his ability to empty his bladder. He notes feeling good overall with gradual improvement in his LUTS and he continues to remain active.  He continues with fatigue and hot flashes associated with his Lupron for ADT.  ALLERGIES:  has No Known Allergies.  Meds: Current Outpatient Medications  Medication Sig Dispense Refill  . amLODipine (NORVASC) 2.5 MG tablet Take 1 tablet (2.5 mg total) by mouth daily. (Patient taking  differently: Take 2.5 mg by mouth every morning. ) 90 tablet 1  . hydrochlorothiazide (HYDRODIURIL) 25 MG tablet Take 1 tablet (25 mg total) by mouth daily. (Patient taking differently: Take 25 mg by mouth every morning. ) 90 tablet 3  . losartan (COZAAR) 100 MG tablet TAKE 1 TABLET BY MOUTH EVERY DAY IN THE MORNING 90 tablet 3  . metoprolol tartrate (LOPRESSOR) 25 MG tablet TAKE ONE-HALF TABLET (12.5 MG TOTAL) BY MOUTH 2 (TWO) TIMES DAILY 30 tablet 8  . warfarin (COUMADIN) 5 MG tablet TAKE AS DIRECTED BY COUMADIN CLINIC (Patient taking differently: Take 7.5 mg by mouth daily in the evening on Sun, Tue, Thur, and Sat.  Take 10 mg by mouth daily in the evening on Mon, Wed, and Fri) 180 tablet 1  . acetaminophen (TYLENOL) 500 MG tablet Take 1,000 mg by mouth 2 (two) times daily as needed for moderate pain.    . Menthol, Topical Analgesic, (BIOFREEZE EX) Apply 1 application topically daily as needed (muscle pain).    . sodium chloride (OCEAN) 0.65 % SOLN nasal spray Place 1 spray into both nostrils as needed for congestion.    . traMADol (ULTRAM) 50 MG tablet Take 1 tablet (50 mg total) by mouth every 6 (six) hours as needed. (Patient not taking: Reported on 02/03/2018) 30 tablet 0   No current facility-administered medications for this encounter.     Physical Findings:  vitals were not taken for this visit.   /10 In general this is a well appearing African-American man in no acute distress. He's alert and oriented x4 and  appropriate throughout the examination. Cardiopulmonary assessment is negative for acute distress and he exhibits normal effort.   Lab Findings: Lab Results  Component Value Date   WBC 6.3 11/09/2017   HGB 11.5 (L) 11/09/2017   HCT 35.1 (L) 11/09/2017   MCV 90.5 11/09/2017   PLT 249 11/09/2017     Radiographic Findings: No results found.  Impression/Plan: 1. 67 y.o. male with Stage T1c, high risk,adenocarcinoma of the prostate with Gleason Score of 4+5, and PSA of7.65.    He will continue to follow up with urology for ongoing PSA determinations and has an appointment scheduled with Dr. Alyson Ingles on 05/07/18.  He anticipates completing a 2-year course of ADT.  He understands what to expect with regards to PSA monitoring going forward. I will look forward to following his response to treatment via correspondence with urology, and would be happy to continue to participate in his care if clinically indicated. I talked to the patient about what to expect in the future, including his risk for erectile dysfunction and rectal bleeding. I encouraged him to call or return to the office if he has any questions regarding his previous radiation or possible radiation side effects. He was comfortable with this plan and will follow up as needed.    Nicholos Johns, PA-C  This document serves as a record of services personally performed by [employer], PA-C. It was created on her behalf by Wilburn Mylar, a trained medical scribe. The creation of this record is based on the scribe's personal observations and the provider's statements to them. This document has been checked and approved by the attending provider.

## 2018-03-16 ENCOUNTER — Encounter (INDEPENDENT_AMBULATORY_CARE_PROVIDER_SITE_OTHER): Payer: Self-pay

## 2018-03-16 ENCOUNTER — Ambulatory Visit: Payer: Medicare Other | Admitting: *Deleted

## 2018-03-16 DIAGNOSIS — Z5181 Encounter for therapeutic drug level monitoring: Secondary | ICD-10-CM

## 2018-03-16 DIAGNOSIS — I4891 Unspecified atrial fibrillation: Secondary | ICD-10-CM | POA: Diagnosis not present

## 2018-03-16 DIAGNOSIS — I4819 Other persistent atrial fibrillation: Secondary | ICD-10-CM

## 2018-03-16 LAB — POCT INR: INR: 2.3 (ref 2.0–3.0)

## 2018-04-27 ENCOUNTER — Ambulatory Visit: Payer: Medicare Other | Admitting: *Deleted

## 2018-04-27 ENCOUNTER — Encounter (INDEPENDENT_AMBULATORY_CARE_PROVIDER_SITE_OTHER): Payer: Self-pay

## 2018-04-27 DIAGNOSIS — I4891 Unspecified atrial fibrillation: Secondary | ICD-10-CM

## 2018-04-27 DIAGNOSIS — I4819 Other persistent atrial fibrillation: Secondary | ICD-10-CM | POA: Diagnosis not present

## 2018-04-27 DIAGNOSIS — Z5181 Encounter for therapeutic drug level monitoring: Secondary | ICD-10-CM

## 2018-04-27 LAB — POCT INR: INR: 2.4 (ref 2.0–3.0)

## 2018-04-27 NOTE — Patient Instructions (Signed)
Description   Continue on same dosage 7.5mg daily except 10 mg on Mondays, Wednesdays, and Fridays. Recheck INR in 6 weeks. Call us with any medication changes or concerns # [phone] [facility].      

## 2018-05-11 ENCOUNTER — Telehealth: Payer: Self-pay | Admitting: *Deleted

## 2018-05-11 NOTE — Telephone Encounter (Signed)
-----   Message from Sueanne Margarita, MD sent at 05/04/2018 10:31 AM EST ----- Regarding: RE: NEW CPAP Please find out if he has to have a new office visit first ----- Message ----- From: Freada Bergeron, CMA Sent: 05/04/2018   9:32 AM EST To: Sueanne Margarita, MD Subject: NEW CPAP                                       Patient called in asking for a new prescription for a new cpap his old one is 68 years old. Thanks, Gae Bon

## 2018-05-11 NOTE — Telephone Encounter (Signed)
Yes, patient will need an office visit per [facility]. Patient is due an office visit for a 1 year follow-up in February. Patient states he can wait until that visit for a new prescription because there is no current problems with his unit he just wanted a new one.

## 2018-05-21 ENCOUNTER — Other Ambulatory Visit: Payer: Self-pay | Admitting: Pharmacist

## 2018-05-21 MED ORDER — WARFARIN SODIUM 5 MG PO TABS: ORAL_TABLET | ORAL | 1 refills | Status: DC

## 2018-05-22 ENCOUNTER — Other Ambulatory Visit: Payer: Self-pay | Admitting: Physician Assistant

## 2018-06-08 ENCOUNTER — Ambulatory Visit: Payer: Medicare Other | Admitting: *Deleted

## 2018-06-08 DIAGNOSIS — I4891 Unspecified atrial fibrillation: Secondary | ICD-10-CM

## 2018-06-08 DIAGNOSIS — Z5181 Encounter for therapeutic drug level monitoring: Secondary | ICD-10-CM

## 2018-06-08 DIAGNOSIS — I4819 Other persistent atrial fibrillation: Secondary | ICD-10-CM

## 2018-06-08 LAB — POCT INR: INR: 3.1 — AB (ref 2.0–3.0)

## 2018-06-08 NOTE — Patient Instructions (Signed)
Description   Today take 5mg  then continue on same dosage 7.5mg  daily except 10 mg on Mondays, Wednesdays, and Fridays. Recheck INR in 6 weeks. Call us with any medication changes or concerns # [phone] [facility].

## 2018-06-21 ENCOUNTER — Telehealth: Payer: Self-pay | Admitting: *Deleted

## 2018-06-21 NOTE — Telephone Encounter (Signed)
Informed patient of sleep study compliance and patient understanding was verbalized. Patient is aware and agreeable to AHI being within range at 3.4. Patient is aware and agreeable to being in compliance with machine usage. Patient is aware and agreeable to no change in current pressures.

## 2018-06-21 NOTE — Telephone Encounter (Signed)
-----   Message from Sueanne Margarita, MD sent at 06/18/2018  8:16 PM EST ----- Good AHI and compliance.  Continue current PAP settings.

## 2018-06-25 ENCOUNTER — Other Ambulatory Visit: Payer: Self-pay | Admitting: Cardiology

## 2018-07-20 ENCOUNTER — Encounter (INDEPENDENT_AMBULATORY_CARE_PROVIDER_SITE_OTHER): Payer: Self-pay

## 2018-07-20 ENCOUNTER — Ambulatory Visit: Payer: Medicare Other | Admitting: *Deleted

## 2018-07-20 DIAGNOSIS — I4819 Other persistent atrial fibrillation: Secondary | ICD-10-CM

## 2018-07-20 DIAGNOSIS — I4891 Unspecified atrial fibrillation: Secondary | ICD-10-CM | POA: Diagnosis not present

## 2018-07-20 DIAGNOSIS — Z5181 Encounter for therapeutic drug level monitoring: Secondary | ICD-10-CM | POA: Diagnosis not present

## 2018-07-20 LAB — POCT INR: INR: 2.7 (ref 2.0–3.0)

## 2018-07-20 NOTE — Patient Instructions (Signed)
Description   Continue on same dosage 7.5mg  daily except 10 mg on Mondays, Wednesdays, and Fridays. Recheck INR in 6 weeks. Call us with any medication changes or concerns # [phone] [facility].

## 2018-07-24 ENCOUNTER — Other Ambulatory Visit: Payer: Self-pay | Admitting: Cardiology

## 2018-08-15 NOTE — Progress Notes (Signed)
Cardiology Office Note:    Date:  08/16/2018   ID:  TYSEAN Alec Hunt, DOB 08/22/1950, MRN [id number]  PCP:  Vernie Shanks, MD  Cardiologist:  Fransico Him, MD   his Referring MD: Vernie Shanks, MD   Chief Complaint  Patient presents with  . Atrial Fibrillation  . Sleep Apnea  . Hypertension    History of Present Illness:    Alec Hunt is a 68 y.o. male with a hx of persistent atrial fibrillation, OSA on BiPAP, HTN, second degree type I Mobitz heart block, RBBB, history of syncope in setting of afib and morbid obesity.  His last echo showed normal LVF with EF 60-65% with G2DD and mildly dilated ascending aorta.  He has a history of bradycardia and earlier this yHis Cardizem was decreased due to HRs in the 40's.    He is here today for followup and is doing well.  He denies any chest pain or pressure, PND, orthopnea, dizziness, palpitations or syncope. He has occasional LE edema which he attributes to his prostate CA treatment.  He has been very fatigued with some DOE since starting his cancer treatments and only has 3 to go.  He is compliant with his meds and is tolerating meds with no SE.  He is doing well with his CPAP device and thinks that he has gotten used to it.  He tolerates the mask and feels the pressure is adequate.  Since going on CPAP He feels rested in the am and has no significant daytime sleepiness.  He denies any significant mouth or nasal dryness or nasal congestion.  He does not think that he snores.    Past Medical History:  Diagnosis Date  . Arthritis    kness & shoulders  . Bradycardia   . History of syncope 10/2013   in setting new dx atrial fib  . Hypertension   . Mobitz type 1 second degree atrioventricular block   . Morbid obesity ([place])   . Nocturia   . OSA treated with BiPAP    Severe w AHI 81.28/hr now on Bipap  . Persistent atrial fibrillation    cardiologist-  dr Tressia Miners Kirti Carl-- dx 2014  . Prostate cancer [facility]) urologist-  dr Alyson Ingles   oncologist-  dr Tammi Klippel   dx 06-11-2017--- Stage T1c, Gleason 4+5, PSA 7.65, vol 32.8cc--  scheduled for radiactive seed implants 11-12-2017  . RBBB   . Wears glasses     Past Surgical History:  Procedure Laterality Date  . CARDIAC CATHETERIZATION  1990s  dr t. Calisha Tindel   per pt told normal coronaries  . CARDIOVASCULAR STRESS TEST  06/16/2012   Low risk nuclear study/  normal LV function and wall motion , ef 61%  . CARDIOVASCULAR STRESS TEST  06/16/2012   Low risk nuclear study w/ no ischemia/ normal LV function and wall motion , ef 61%  . COLONOSCOPY  2012 approx.  . CYSTOSCOPY  11/12/2017   Procedure: Erlene Quan;  Surgeon: Cleon Gustin, MD;  [facility];  Service: Urology;;  . KNEE ARTHROSCOPY Right 1990s  . LUMBAR LAMINECTOMY/DECOMPRESSION MICRODISCECTOMY N/A 09/27/2014   Procedure: LUMBAR LAMINECTOMY/DECOMPRESSION MICRODISCECTOMY 3 LEVELS L4-5 L3-4 POSSIBLE L2-3 BILATERAL FORAMINOTOMIES AT L-3,4 AND 5;  Surgeon: Susa Day, MD;  [facility];  Service: Orthopedics;  Laterality: N/A;  . PROSTATE BIOPSY  06-11-2017   dr Alyson Ingles office  . RADIOACTIVE SEED IMPLANT N/A 11/12/2017   Procedure: RADIOACTIVE SEED IMPLANT/BRACHYTHERAPY IMPLANT;  Surgeon: Cleon Gustin, MD;  [facility];  Service: Urology;  Laterality: N/A;  . ROTATOR CUFF REPAIR Right 2015  . SPACE OAR INSTILLATION N/A 11/12/2017   Procedure: SPACE OAR INSTILLATION;  Surgeon: Cleon Gustin, MD;  [facility];  Service: Urology;  Laterality: N/A;  . TRANSTHORACIC ECHOCARDIOGRAM  05/26/2017   mild LVH, ef 20-94%, grade 2 diastolic dysfunction/  mild dilaged ascending aorta/  trivial PR and TR/  mild RAE  . TRANSTHORACIC ECHOCARDIOGRAM     mild LVH, ef 60-65%, grade 2 diastoli dysfunction/ mild dilated ascending aorta/  trivil PR and TR/  mild RAE    Current Medications: Current Meds  Medication Sig  . acetaminophen (TYLENOL) 500 MG tablet Take 1,000 mg by mouth 2 (two) times  daily as needed for moderate pain.  . hydrochlorothiazide (HYDRODIURIL) 25 MG tablet TAKE 1 TABLET BY MOUTH EVERY DAY  . losartan (COZAAR) 100 MG tablet TAKE 1 TABLET BY MOUTH EVERY DAY IN THE MORNING  . warfarin (COUMADIN) 5 MG tablet Take 1.5 to 2 tablets daily by mouth as directed by Coumadin clinic     Allergies:   Patient has no known allergies.   Social History   Socioeconomic History  . Marital status: Married    Spouse name: Not on file  . Number of children: 2  . Years of education: Not on file  . Highest education level: Not on file  Occupational History  . Occupation: retired    Fish farm manager: [employer]  Social Needs  . Financial resource strain: Not on file  . Food insecurity:    Worry: Not on file    Inability: Not on file  . Transportation needs:    Medical: Not on file    Non-medical: Not on file  Tobacco Use  . Smoking status: Former Smoker    Packs/day: 0.25    Years: 15.00    Pack years: 3.75    Types: Cigarettes    Last attempt to quit: 02/03/2000    Years since quitting: 18.5  . Smokeless tobacco: Never Used  Substance and Sexual Activity  . Alcohol use: Not Currently    Comment: hx of ETOH use quit at age 53 (29)  . Drug use: No  . Sexual activity: Not on file  Lifestyle  . Physical activity:    Days per week: Not on file    Minutes per session: Not on file  . Stress: Not on file  Relationships  . Social connections:    Talks on phone: Not on file    Gets together: Not on file    Attends religious service: Not on file    Active member of club or organization: Not on file    Attends meetings of clubs or organizations: Not on file    Relationship status: Not on file  Other Topics Concern  . Not on file  Social History Narrative   Married.    Retired from [employer] after 43 years.   3 sons but one passed from a MVA     Family History: The patient's family history includes CAD in his mother; Heart attack in his mother; Heart disease in  his mother; Hypertension in his brother. There is no history of Cancer.  ROS:   Please see the history of present illness.    ROS  All other systems reviewed and negative.   EKGs/Labs/Other Studies Reviewed:    The following studies were reviewed today: PAP download  EKG:  EKG is ordered today and showed  sinus brady at 56bpm with RBBB  Recent Labs: 11/09/2017: ALT 21; BUN 18; Creatinine, Ser 0.93; Hemoglobin 11.5; Platelets 249; Potassium 3.9; Sodium 139   Recent Lipid Panel No results found for: CHOL, TRIG, HDL, CHOLHDL, VLDL, LDLCALC, LDLDIRECT  Physical Exam:    VS:  BP 138/76   Pulse (!) 56   Ht 6\' 2"  (1.88 m)   Wt (!) 352 lb 9.6 oz (159.9 kg)   BMI 45.27 kg/m     Wt Readings from Last 3 Encounters:  08/16/18 (!) 352 lb 9.6 oz (159.9 kg)  11/09/17 (!) 342 lb 2 oz (155.2 kg)  08/10/17 (!) 340 lb 12.8 oz (154.6 kg)     GEN:  Well nourished, well developed in no acute distress HEENT: Normal NECK: No JVD; No carotid bruits LYMPHATICS: No lymphadenopathy CARDIAC: RRR, no murmurs, rubs, gallops RESPIRATORY:  Clear to auscultation without rales, wheezing or rhonchi  ABDOMEN: Soft, non-tender, non-distended MUSCULOSKELETAL:  No edema; No deformity  SKIN: Warm and dry NEUROLOGIC:  Alert and oriented x 3 PSYCHIATRIC:  Normal affect   ASSESSMENT:    1. Persistent atrial fibrillation   2. Essential hypertension   3. OSA (obstructive sleep apnea)   4. Morbid obesity ([place])   5. Bradycardia    PLAN:    In order of problems listed above:  1.  Paroxysmal atrial fibrillation - He is maintaining NSR on exam.  He will continue on warfarin. He is off BB due to bradycardia.  2.  HTN - BP is well controlled on exam today.  He will continue on HCTZ 25mg  daily, Losartan 100mg  daily and Amlodipine 2.5mg  daily.  Creatinine was 1.01 and K+ 4.4 on 01/2018.  I will repeat a BMET today.   3.  OSA - the patient is tolerating PAP therapy well without any problems. The PAP download was  reviewed today and showed an AHI of 3.7/hr on 21/15 cm H2O with 100% compliance in using more than 4 hours nightly.  The patient has been using and benefiting from PAP use and will continue to benefit from therapy. He wants a new a device so I will order him a new BiPAP.  He will need a followup OV in 10 weeks to document compliance.   4.  Morbid Obesity - I have encouraged him to get into a routine exercise program and cut back on carbs and portions.   5.  Bradycardia - this has resolved    Medication Adjustments/Labs and Tests Ordered: Current medicines are reviewed at length with the patient today.  Concerns regarding medicines are outlined above.  Orders Placed This Encounter  Procedures  . EKG 12-Lead   No orders of the defined types were placed in this encounter.   Signed, Fransico Him, MD  08/16/2018 10:32 AM    [place]

## 2018-08-16 ENCOUNTER — Ambulatory Visit: Payer: Medicare Other | Admitting: Cardiology

## 2018-08-16 ENCOUNTER — Telehealth: Payer: Self-pay | Admitting: *Deleted

## 2018-08-16 ENCOUNTER — Encounter: Payer: Self-pay | Admitting: Cardiology

## 2018-08-16 VITALS — BP 138/76 | HR 56 | Ht 74.0 in | Wt 352.6 lb

## 2018-08-16 DIAGNOSIS — G4733 Obstructive sleep apnea (adult) (pediatric): Secondary | ICD-10-CM | POA: Diagnosis not present

## 2018-08-16 DIAGNOSIS — I10 Essential (primary) hypertension: Secondary | ICD-10-CM

## 2018-08-16 DIAGNOSIS — R001 Bradycardia, unspecified: Secondary | ICD-10-CM

## 2018-08-16 DIAGNOSIS — I4819 Other persistent atrial fibrillation: Secondary | ICD-10-CM | POA: Diagnosis not present

## 2018-08-16 LAB — BASIC METABOLIC PANEL
BUN/Creatinine Ratio: 14 (ref 10–24)
BUN: 13 mg/dL (ref 8–27)
CALCIUM: 9.6 mg/dL (ref 8.6–10.2)
CHLORIDE: 102 mmol/L (ref 96–106)
CO2: 22 mmol/L (ref 20–29)
Creatinine, Ser: 0.91 mg/dL (ref 0.76–1.27)
GFR calc non Af Amer: 86 mL/min/{1.73_m2} (ref >59)
GFR, EST AFRICAN AMERICAN: 100 mL/min/{1.73_m2} (ref >59)
Glucose: 121 mg/dL — ABNORMAL HIGH (ref 65–99)
POTASSIUM: 3.9 mmol/L (ref 3.5–5.2)
Sodium: 138 mmol/L (ref 134–144)

## 2018-08-16 NOTE — Telephone Encounter (Signed)
-----   Message from Sarina Ill, RN sent at 08/16/2018  2:17 PM EST ----- Regarding: Sleep Hello, Dr. Radford Pax ordered a Resmed BiPAP at 21/15 cm H2O with heated humidity and mask of choice. Orders placed, please send. Thanks, [employer]

## 2018-08-16 NOTE — Patient Instructions (Signed)
Medication Instructions:  Your physician recommends that you continue on your current medications as directed. Please refer to the Current Medication list given to you today.  If you need a refill on your cardiac medications before your next appointment, please call your pharmacy.   Lab work: Today: BMET If you have labs (blood work) drawn today and your tests are completely normal, you will receive your results only by: Marland Kitchen MyChart Message (if you have MyChart) OR . A paper copy in the mail If you have any lab test that is abnormal or we need to change your treatment, we will call you to review the results.  Follow-Up: At [facility], you and your health needs are our priority.  As part of our continuing mission to provide you with exceptional heart care, we have created designated Provider Care Teams.  These Care Teams include your primary Cardiologist (physician) and Advanced Practice Providers (APPs -  Physician Assistants and Nurse Practitioners) who all work together to provide you with the care you need, when you need it. You will need a follow up appointment in 1 years.  Please call our office 2 months in advance to schedule this appointment.  You may see Fransico Him, MD or one of the following Advanced Practice Providers on your designated Care Team:   [place], PA-C Melina Copa, PA-C . Ermalinda Barrios, PA-C

## 2018-08-16 NOTE — Telephone Encounter (Signed)
Order placed to [facility] today via fax.

## 2018-08-18 ENCOUNTER — Other Ambulatory Visit: Payer: Self-pay | Admitting: Cardiology

## 2018-08-25 ENCOUNTER — Other Ambulatory Visit: Payer: Self-pay | Admitting: Physician Assistant

## 2018-08-30 ENCOUNTER — Ambulatory Visit: Payer: Medicare Other

## 2018-08-30 DIAGNOSIS — I4891 Unspecified atrial fibrillation: Secondary | ICD-10-CM | POA: Diagnosis not present

## 2018-08-30 DIAGNOSIS — Z5181 Encounter for therapeutic drug level monitoring: Secondary | ICD-10-CM | POA: Diagnosis not present

## 2018-08-30 DIAGNOSIS — I4819 Other persistent atrial fibrillation: Secondary | ICD-10-CM | POA: Diagnosis not present

## 2018-08-30 LAB — POCT INR: INR: 2.5 (ref 2.0–3.0)

## 2018-08-30 NOTE — Patient Instructions (Signed)
Description   Continue on same dosage 7.5mg  daily except 10 mg on Mondays, Wednesdays, and Fridays. Recheck INR in 6 weeks. Call us with any medication changes or concerns # [phone] [facility].

## 2018-10-08 ENCOUNTER — Telehealth: Payer: Self-pay

## 2018-10-08 NOTE — Telephone Encounter (Signed)

## 2018-10-11 ENCOUNTER — Ambulatory Visit (INDEPENDENT_AMBULATORY_CARE_PROVIDER_SITE_OTHER): Payer: Medicare Other | Admitting: *Deleted

## 2018-10-11 ENCOUNTER — Other Ambulatory Visit: Payer: Self-pay

## 2018-10-11 DIAGNOSIS — Z5181 Encounter for therapeutic drug level monitoring: Secondary | ICD-10-CM | POA: Diagnosis not present

## 2018-10-11 DIAGNOSIS — I4891 Unspecified atrial fibrillation: Secondary | ICD-10-CM

## 2018-10-11 DIAGNOSIS — I4819 Other persistent atrial fibrillation: Secondary | ICD-10-CM

## 2018-10-11 LAB — POCT INR: INR: 2.7 (ref 2.0–3.0)

## 2018-10-11 NOTE — Patient Instructions (Signed)
Description   Spoke with pt and instructed to continue on same dosage 7.5mg  daily except 10 mg on Mondays, Wednesdays, and Fridays. Recheck INR in 8 weeks. Call us with any medication changes or concerns # [phone] [facility].

## 2018-10-22 ENCOUNTER — Other Ambulatory Visit: Payer: Self-pay | Admitting: Cardiology

## 2018-10-22 NOTE — Telephone Encounter (Signed)
Pt's pharmacy is requesting a refill on metoprolol tartrate 25 mg tablet. This medication was taken off of pt's medication list. I do not think Dr. Radford Pax D/C this medication. Please address

## 2018-11-12 ENCOUNTER — Other Ambulatory Visit: Payer: Self-pay | Admitting: Cardiology

## 2018-11-20 ENCOUNTER — Other Ambulatory Visit: Payer: Self-pay | Admitting: Cardiology

## 2018-12-01 ENCOUNTER — Telehealth: Payer: Self-pay

## 2018-12-01 NOTE — Telephone Encounter (Signed)

## 2018-12-06 ENCOUNTER — Ambulatory Visit (INDEPENDENT_AMBULATORY_CARE_PROVIDER_SITE_OTHER): Payer: Medicare Other | Admitting: *Deleted

## 2018-12-06 ENCOUNTER — Other Ambulatory Visit: Payer: Self-pay

## 2018-12-06 DIAGNOSIS — I4819 Other persistent atrial fibrillation: Secondary | ICD-10-CM | POA: Diagnosis not present

## 2018-12-06 DIAGNOSIS — Z5181 Encounter for therapeutic drug level monitoring: Secondary | ICD-10-CM

## 2018-12-06 DIAGNOSIS — I4891 Unspecified atrial fibrillation: Secondary | ICD-10-CM

## 2018-12-06 LAB — POCT INR: INR: 3.1 — AB (ref 2.0–3.0)

## 2018-12-06 NOTE — Patient Instructions (Signed)
Description   Continue on same dosage 7.5mg  daily except 10 mg on Mondays, Wednesdays, and Fridays. Recheck INR in 7 weeks. Call us with any medication changes or concerns # [phone] [facility].

## 2019-01-20 ENCOUNTER — Telehealth: Payer: Self-pay | Admitting: *Deleted

## 2019-01-20 NOTE — Telephone Encounter (Signed)

## 2019-01-20 NOTE — Telephone Encounter (Signed)
LMOVM to call back for Prescreen Covid Questions.

## 2019-01-23 ENCOUNTER — Other Ambulatory Visit: Payer: Self-pay | Admitting: Cardiology

## 2019-01-24 ENCOUNTER — Ambulatory Visit (INDEPENDENT_AMBULATORY_CARE_PROVIDER_SITE_OTHER): Payer: Medicare Other | Admitting: *Deleted

## 2019-01-24 ENCOUNTER — Other Ambulatory Visit: Payer: Self-pay

## 2019-01-24 DIAGNOSIS — I4819 Other persistent atrial fibrillation: Secondary | ICD-10-CM | POA: Diagnosis not present

## 2019-01-24 DIAGNOSIS — Z5181 Encounter for therapeutic drug level monitoring: Secondary | ICD-10-CM

## 2019-01-24 DIAGNOSIS — I4891 Unspecified atrial fibrillation: Secondary | ICD-10-CM | POA: Diagnosis not present

## 2019-01-24 LAB — POCT INR: INR: 2.9 (ref 2.0–3.0)

## 2019-01-24 NOTE — Patient Instructions (Signed)
Description   Continue on same dosage 7.5mg  daily except 10 mg on Mondays, Wednesdays, and Fridays. Recheck INR in 8 weeks. Call us with any medication changes or concerns # [phone] [facility].

## 2019-01-26 ENCOUNTER — Other Ambulatory Visit: Payer: Self-pay | Admitting: Cardiology

## 2019-01-26 NOTE — Telephone Encounter (Signed)
Pt reports that he has been taking this since February.  States he didn't know he was suppose to stop it.  Reports that he does have dizziness occasionally and feels tired a lot. Explained why it was recommended to stop in February. Pt advised to stop taking Metoprolol. Patient verbalized understanding and agreeable to plan.  Forwarding to [place] for her FYI.

## 2019-01-26 NOTE — Telephone Encounter (Signed)
He is not supposed to be on lopressor due to bradycardia - has he been taking this

## 2019-01-26 NOTE — Telephone Encounter (Signed)
Pt calling requesting a refill on metoprolol. This medication was removed from pt's medication list. Does pt supposed to still be taking this medication? Please address

## 2019-01-26 NOTE — Telephone Encounter (Signed)
pleaes have him check his BP and HR daily around lunch for a week and call with the results.

## 2019-01-27 NOTE — Telephone Encounter (Signed)
Spoke with patient who states he was mistaken, he was not taking metoprolol He reports that his BP has been normal but he cannot remember the numbers States no concerns about low heart rate  States he believes his fatigue is related to radiation for prostate cancer and that he has one more injection. States he was advised by radiologist that fatigue would continue for about a month after the last injection. He denies concerns related to his heart. He thanked me for the call.

## 2019-01-27 NOTE — Telephone Encounter (Signed)
Attempted to call the pt back to endorse recommendations per Dr. Radford Pax, and pt did not answer and VM was not working.

## 2019-03-21 ENCOUNTER — Other Ambulatory Visit: Payer: Self-pay

## 2019-03-21 ENCOUNTER — Ambulatory Visit (INDEPENDENT_AMBULATORY_CARE_PROVIDER_SITE_OTHER): Payer: Medicare Other | Admitting: *Deleted

## 2019-03-21 DIAGNOSIS — I4891 Unspecified atrial fibrillation: Secondary | ICD-10-CM

## 2019-03-21 DIAGNOSIS — I4819 Other persistent atrial fibrillation: Secondary | ICD-10-CM | POA: Diagnosis not present

## 2019-03-21 DIAGNOSIS — Z5181 Encounter for therapeutic drug level monitoring: Secondary | ICD-10-CM

## 2019-03-21 LAB — POCT INR: INR: 2.4 (ref 2.0–3.0)

## 2019-03-21 NOTE — Patient Instructions (Signed)
Description   Continue on same dosage 7.5mg daily except 10 mg on Mondays, Wednesdays, and Fridays. Recheck INR in 8 weeks. Call us with any medication changes or concerns # [phone] [facility]in [facility].      

## 2019-04-25 ENCOUNTER — Telehealth: Payer: Self-pay | Admitting: Cardiology

## 2019-04-25 NOTE — Telephone Encounter (Signed)
Patient already had an order in Epic from February so I have faxed that order to [place]. Order placed to [employer] for Resmed BiPAP at 21/15 cm H2O and heated humidity and mask of choice.

## 2019-04-25 NOTE — Telephone Encounter (Signed)
New Message  1) What problem are you experiencing? Needs new CPAP machine, getting error message about being at the end of life cycle.  2) Who is your medical equipment company? [employer]   Please route to the sleep study assistant.

## 2019-05-08 ENCOUNTER — Other Ambulatory Visit: Payer: Self-pay | Admitting: Cardiology

## 2019-05-16 ENCOUNTER — Ambulatory Visit (INDEPENDENT_AMBULATORY_CARE_PROVIDER_SITE_OTHER): Payer: Medicare Other | Admitting: *Deleted

## 2019-05-16 ENCOUNTER — Other Ambulatory Visit: Payer: Self-pay

## 2019-05-16 ENCOUNTER — Encounter (INDEPENDENT_AMBULATORY_CARE_PROVIDER_SITE_OTHER): Payer: Self-pay

## 2019-05-16 DIAGNOSIS — I4891 Unspecified atrial fibrillation: Secondary | ICD-10-CM

## 2019-05-16 DIAGNOSIS — Z5181 Encounter for therapeutic drug level monitoring: Secondary | ICD-10-CM | POA: Diagnosis not present

## 2019-05-16 LAB — POCT INR: INR: 2.6 (ref 2.0–3.0)

## 2019-05-16 NOTE — Patient Instructions (Signed)
Description   Continue on same dosage 7.5mg daily except 10 mg on Mondays, Wednesdays, and Fridays. Recheck INR in 8 weeks. Call us with any medication changes or concerns # [phone] [facility]in [facility].      

## 2019-06-18 ENCOUNTER — Other Ambulatory Visit: Payer: Self-pay | Admitting: Physician Assistant

## 2019-07-11 ENCOUNTER — Ambulatory Visit (INDEPENDENT_AMBULATORY_CARE_PROVIDER_SITE_OTHER): Payer: Medicare Other | Admitting: *Deleted

## 2019-07-11 ENCOUNTER — Other Ambulatory Visit: Payer: Self-pay

## 2019-07-11 DIAGNOSIS — I4819 Other persistent atrial fibrillation: Secondary | ICD-10-CM

## 2019-07-11 DIAGNOSIS — I4891 Unspecified atrial fibrillation: Secondary | ICD-10-CM | POA: Diagnosis not present

## 2019-07-11 DIAGNOSIS — Z5181 Encounter for therapeutic drug level monitoring: Secondary | ICD-10-CM

## 2019-07-11 LAB — POCT INR: INR: 2.9 (ref 2.0–3.0)

## 2019-07-11 NOTE — Patient Instructions (Signed)
Description   Continue on same dosage 7.5mg  daily except 10 mg on Mondays, Wednesdays, and Fridays. Recheck INR in 8 weeks. Call us with any medication changes or concerns # [phone] [facility]in Clinic.

## 2019-08-04 ENCOUNTER — Other Ambulatory Visit: Payer: Self-pay | Admitting: Cardiology

## 2019-08-16 ENCOUNTER — Telehealth: Payer: Self-pay | Admitting: *Deleted

## 2019-08-16 NOTE — Telephone Encounter (Signed)

## 2019-08-19 ENCOUNTER — Telehealth: Payer: Medicare Other | Admitting: Cardiology

## 2019-08-23 NOTE — Progress Notes (Signed)
Virtual Visit via Video Note   This visit type was conducted due to national recommendations for restrictions regarding the COVID-19 Pandemic (e.g. social distancing) in an effort to limit this patient's exposure and mitigate transmission in our community.  Due to his co-morbid illnesses, this patient is at least at moderate risk for complications without adequate follow up.  This format is felt to be most appropriate for this patient at this time.  All issues noted in this document were discussed and addressed.  A limited physical exam was performed with this format.  Please refer to the patient's chart for his consent to telehealth for [facility].   Evaluation Performed:  Follow-up visit  This visit type was conducted due to national recommendations for restrictions regarding the COVID-19 Pandemic (e.g. social distancing).  This format is felt to be most appropriate for this patient at this time.  All issues noted in this document were discussed and addressed.  No physical exam was performed (except for noted visual exam findings with Video Visits).  Please refer to the patient's chart (MyChart message for video visits and phone note for telephone visits) for the patient's consent to telehealth for [facility].  Date:  08/24/2019   ID:  Alec Hunt, DOB [birth year], MRN [id number]  Patient Location:  Home  Provider location:   [place]  PCP:  Vernie Shanks, MD  Cardiologist:  Fransico Him, MD  Electrophysiologist:  None   Chief Complaint:  OSA, PAF  History of Present Illness:    Alec Hunt is a 69 y.o. male who presents via audio/video conferencing for a telehealth visit today.    Alec Hunt is a 69 y.o. male with a hx of persistent atrial fibrillation, OSA on BiPAP, HTN, second degree type I Mobitz heart block, RBBB, history of syncope in setting of afiband morbid obesity. His last echo showed normal LVF with EF 60-65% with G2DD and mildly dilated  ascending aorta. He has a history of bradycardia and earlier this yHis Cardizem was decreased due to HRs in the 40's.   He is here today for followup and is doing well.  He denies any chest pain or pressure, SOB, DOE (except with extreme exertion outside working in the yard), PND, orthopnea, LE edema, dizziness, palpitations or syncope. He is compliant with his meds and is tolerating meds with no SE.    He is doing well with his BiPAP device and thinks that he has gotten used to it.  He tolerates the mask and feels the pressure is adequate.  Since going on BiPAP he feels rested in the am and has no significant daytime sleepiness.  He denies any significant mouth or nasal dryness or nasal congestion.  He does not think that he snores.    The patient does not have symptoms concerning for COVID-19 infection (fever, chills, cough, or new shortness of breath).   Prior CV studies:   The following studies were reviewed today:  Outside labs from PCP.  PAP download from [employer]  Past Medical History:  Diagnosis Date   Arthritis    kness & shoulders   Bradycardia    History of syncope 10/2013   in setting new dx atrial fib   Hypertension    Mobitz type 1 second degree atrioventricular block    Morbid obesity ([facility])    Nocturia    OSA treated with BiPAP    Severe w AHI 81.28/hr now on Bipap   Persistent atrial fibrillation  cardiologist-  dr Tressia Miners Markeeta Scalf-- dx 2014   Prostate cancer [facility]) urologist-  dr Alyson Ingles  oncologist-  dr Tammi Klippel   dx 06-11-2017--- Stage T1c, Gleason 4+5, PSA 7.65, vol 32.8cc--  scheduled for radiactive seed implants 11-12-2017   RBBB    Wears glasses    Past Surgical History:  Procedure Laterality Date   CARDIAC CATHETERIZATION  1990s  dr t. Radford Pax   per pt told normal coronaries   CARDIOVASCULAR STRESS TEST  06/16/2012   Low risk nuclear study/  normal LV function and wall motion , ef 61%   CARDIOVASCULAR STRESS TEST  06/16/2012   Low risk  nuclear study w/ no ischemia/ normal LV function and wall motion , ef 61%   COLONOSCOPY  2012 approx.   CYSTOSCOPY  11/12/2017   Procedure: CYSTOSCOPY FLEXIBLE;  Surgeon: Cleon Gustin, MD;  [facility];  Service: Urology;;   KNEE ARTHROSCOPY Right 1990s   LUMBAR LAMINECTOMY/DECOMPRESSION MICRODISCECTOMY N/A 09/27/2014   Procedure: LUMBAR LAMINECTOMY/DECOMPRESSION MICRODISCECTOMY 3 LEVELS L4-5 L3-4 POSSIBLE L2-3 BILATERAL FORAMINOTOMIES AT L-3,4 AND 5;  Surgeon: Susa Day, MD;  [facility];  Service: Orthopedics;  Laterality: N/A;   PROSTATE BIOPSY  06-11-2017   dr Alyson Ingles office   RADIOACTIVE SEED IMPLANT N/A 11/12/2017   Procedure: RADIOACTIVE SEED IMPLANT/BRACHYTHERAPY IMPLANT;  Surgeon: Cleon Gustin, MD;  [facility];  Service: Urology;  Laterality: N/A;   ROTATOR CUFF REPAIR Right 2015   SPACE OAR INSTILLATION N/A 11/12/2017   Procedure: SPACE OAR INSTILLATION;  Surgeon: Cleon Gustin, MD;  [facility];  Service: Urology;  Laterality: N/A;   TRANSTHORACIC ECHOCARDIOGRAM  05/26/2017   mild LVH, ef 123456, grade 2 diastolic dysfunction/  mild dilaged ascending aorta/  trivial PR and TR/  mild RAE   TRANSTHORACIC ECHOCARDIOGRAM     mild LVH, ef 60-65%, grade 2 diastoli dysfunction/ mild dilated ascending aorta/  trivil PR and TR/  mild RAE     Current Meds  Medication Sig   acetaminophen (TYLENOL) 500 MG tablet Take 1,000 mg by mouth 2 (two) times daily as needed for moderate pain.   alfuzosin (UROXATRAL) 10 MG 24 hr tablet Take 10 mg by mouth at bedtime.   hydrochlorothiazide (HYDRODIURIL) 25 MG tablet TAKE 1 TABLET BY MOUTH EVERY DAY   losartan (COZAAR) 100 MG tablet TAKE 1 TABLET BY MOUTH EVERY DAY IN THE MORNING   metFORMIN (GLUCOPHAGE) 500 MG tablet Take 500 mg by mouth 2 (two) times daily.   solifenacin (VESICARE) 5 MG tablet Take 5 mg by mouth daily.   warfarin (COUMADIN) 5 MG tablet TAKE 1.5 TO 2 TABLETS DAILY BY MOUTH AS  DIRECTED BY COUMADIN CLINIC     Allergies:   Patient has no known allergies.   Social History   Tobacco Use   Smoking status: Former Smoker    Packs/day: 0.25    Years: 15.00    Pack years: 3.75    Types: Cigarettes    Quit date: 02/03/2000    Years since quitting: 19.5   Smokeless tobacco: Never Used  Substance Use Topics   Alcohol use: Not Currently    Comment: hx of ETOH use quit at age 10 (80)   Drug use: No     Family Hx: The patient's family history includes CAD in his mother; Heart attack in his mother; Heart disease in his mother; Hypertension in his brother. There is no history of Cancer.  ROS:   Please see the history of present illness.  All other systems reviewed and are negative.   Labs/Other Tests and Data Reviewed:    Recent Labs: No results found for requested labs within last 8760 hours.   Recent Lipid Panel No results found for: CHOL, TRIG, HDL, CHOLHDL, LDLCALC, LDLDIRECT  Wt Readings from Last 3 Encounters:  08/24/19 (!) 321 lb (145.6 kg)  08/16/18 (!) 352 lb 9.6 oz (159.9 kg)  11/09/17 (!) 342 lb 2 oz (155.2 kg)     Objective:    Vital Signs:  BP 121/83    Pulse 71    Ht 6\' 1"  (1.854 m)    Wt (!) 321 lb (145.6 kg)    BMI 42.35 kg/m     ASSESSMENT & PLAN:    1.  OSA - The PAP download was reviewed today and showed an AHI of 8.6/hr on 21/15 cm H2O with 100% compliance in using more than 4 hours nightly.  The patient has been using and benefiting from PAP use and will continue to benefit from therapy. His AHI is elevated and appears to have a mask lead on his download.  He just got a new mask.  He also sleeps on his back but says that it is the only way he can sleep.   2.  PAF -denies any palpitations -no bleeding problems on warfarin -continue warfarin followed in coumadin clinic -INR 2.9.   3.  HTN -BP controlled -continue Losartan 100mg  daily, HCTZ 25mg  daily and amlodipine 2.5mg  daily.  -I will get a copy of his last BMET  from his PCP  4.  Mobitz I second degree AV block -asymptomatic with no dizziness or syncope  5. Morbid Obesity -he has lost 31lbs in the past year -I have encouraged him to continue to watch his carbs and portions -he has an exercise bike but has not been using it as he is recovering from XRT for prostate Ca     COVID-19 Education: The signs and symptoms of COVID-19 were discussed with the patient and how to seek care for testing (follow up with PCP or arrange E-visit).  The importance of social distancing was discussed today.  Patient Risk:   After full review of this patient's clinical status, I feel that they are at least moderate risk at this time.  Time:   Today, I have spent 20 minutes on telemedicine discussing medical problems including PAF, OSA, HTN, morbid obesity and reviewing patient's chart including PAP compliance from [facility]  Medication Adjustments/Labs and Tests Ordered: Current medicines are reviewed at length with the patient today.  Concerns regarding medicines are outlined above.  Tests Ordered: No orders of the defined types were placed in this encounter.  Medication Changes: No orders of the defined types were placed in this encounter.   Disposition:  Follow up in 1 year(s)  Signed, Fransico Him, MD  08/24/2019 7:57 AM    [facility]

## 2019-08-24 ENCOUNTER — Telehealth (INDEPENDENT_AMBULATORY_CARE_PROVIDER_SITE_OTHER): Payer: Medicare Other | Admitting: Cardiology

## 2019-08-24 ENCOUNTER — Other Ambulatory Visit: Payer: Self-pay

## 2019-08-24 VITALS — BP 121/83 | HR 71 | Ht 73.0 in | Wt 321.0 lb

## 2019-08-24 DIAGNOSIS — I4819 Other persistent atrial fibrillation: Secondary | ICD-10-CM

## 2019-08-24 DIAGNOSIS — G4733 Obstructive sleep apnea (adult) (pediatric): Secondary | ICD-10-CM | POA: Diagnosis not present

## 2019-08-24 DIAGNOSIS — I10 Essential (primary) hypertension: Secondary | ICD-10-CM | POA: Diagnosis not present

## 2019-08-24 DIAGNOSIS — I441 Atrioventricular block, second degree: Secondary | ICD-10-CM | POA: Diagnosis not present

## 2019-08-24 DIAGNOSIS — Z6841 Body Mass Index (BMI) 40.0 and over, adult: Secondary | ICD-10-CM

## 2019-08-24 NOTE — Patient Instructions (Signed)
Medication Instructions:  Your physician recommends that you continue on your current medications as directed. Please refer to the Current Medication list given to you today.  *If you need a refill on your cardiac medications before your next appointment, please call your pharmacy*  Lab Work: none If you have labs (blood work) drawn today and your tests are completely normal, you will receive your results only by: Marland Kitchen MyChart Message (if you have MyChart) OR . A paper copy in the mail If you have any lab test that is abnormal or we need to change your treatment, we will call you to review the results.  Testing/Procedures: none  Follow-Up: At [facility], you and your health needs are our priority.  As part of our continuing mission to provide you with exceptional heart care, we have created designated Provider Care Teams.  These Care Teams include your primary Cardiologist (physician) and Advanced Practice Providers (APPs -  Physician Assistants and Nurse Practitioners) who all work together to provide you with the care you need, when you need it.  Your next appointment:   12 month(s)  The format for your next appointment:   In Person  Provider:   Fransico Him, MD  Other Instructions

## 2019-08-26 ENCOUNTER — Ambulatory Visit: Payer: Medicare Other | Attending: Internal Medicine

## 2019-08-26 DIAGNOSIS — Z23 Encounter for immunization: Secondary | ICD-10-CM

## 2019-08-26 NOTE — Progress Notes (Signed)
   Covid-19 Vaccination Clinic  Name:  Alec Hunt    MRN: [id number] DOB: September 08, 1950  08/26/2019  Mr. Alec Hunt was observed post Covid-19 immunization for 15 minutes without incidence. He was provided with Vaccine Information Sheet and instruction to access the V-Safe system.   Mr. Alec Hunt was instructed to call 911 with any severe reactions post vaccine: Marland Kitchen Difficulty breathing  . Swelling of your face and throat  . A fast heartbeat  . A bad rash all over your body  . Dizziness and weakness    Immunizations Administered    Name Date Dose VIS Date Route   Pfizer COVID-19 Vaccine 08/26/2019  1:26 PM 0.3 mL 06/10/2019 Intramuscular   Manufacturer: [place]   Lot: [id number]   [place]: [id number]

## 2019-09-05 ENCOUNTER — Ambulatory Visit (INDEPENDENT_AMBULATORY_CARE_PROVIDER_SITE_OTHER): Payer: Medicare Other | Admitting: *Deleted

## 2019-09-05 ENCOUNTER — Other Ambulatory Visit: Payer: Self-pay

## 2019-09-05 DIAGNOSIS — I4819 Other persistent atrial fibrillation: Secondary | ICD-10-CM

## 2019-09-05 DIAGNOSIS — I4891 Unspecified atrial fibrillation: Secondary | ICD-10-CM | POA: Diagnosis not present

## 2019-09-05 DIAGNOSIS — Z5181 Encounter for therapeutic drug level monitoring: Secondary | ICD-10-CM | POA: Diagnosis not present

## 2019-09-05 LAB — POCT INR: INR: 2.3 (ref 2.0–3.0)

## 2019-09-05 NOTE — Patient Instructions (Signed)
Description   Continue on same dosage 7.5mg  daily except 10 mg on Mondays, Wednesdays, and Fridays. Recheck INR in 8 weeks. Call us with any medication changes or concerns # [phone] [facility]madin [facility].

## 2019-09-11 ENCOUNTER — Other Ambulatory Visit: Payer: Self-pay | Admitting: Physician Assistant

## 2019-09-20 ENCOUNTER — Ambulatory Visit: Payer: Medicare Other | Attending: Internal Medicine

## 2019-09-20 DIAGNOSIS — Z23 Encounter for immunization: Secondary | ICD-10-CM

## 2019-09-20 NOTE — Progress Notes (Signed)
   Covid-19 Vaccination Clinic  Name:  Alec Hunt    MRN: [id number] DOB: 17-May-1951  09/20/2019  [name] was observed post Covid-19 immunization for 15 minutes without incident. He was provided with Vaccine Information Sheet and instruction to access the V-Safe system.   [name] was instructed to call 911 with any severe reactions post vaccine: Marland Kitchen Difficulty breathing  . Swelling of face and throat  . A fast heartbeat  . A bad rash all over body  . Dizziness and weakness   Immunizations Administered    Name Date Dose VIS Date Route   Pfizer COVID-19 Vaccine 09/20/2019  3:50 PM 0.3 mL 06/10/2019 Intramuscular   Manufacturer: [place]   Lot: [id number]   [place]: [id number]

## 2019-09-22 ENCOUNTER — Telehealth: Payer: Self-pay | Admitting: Cardiology

## 2019-09-22 NOTE — Telephone Encounter (Signed)
New Message  Patient was advised by his PCP to contact Dr. Radford Pax about his cholesterol and cholesterol medication. Please give patient a call back to discuss.

## 2019-09-22 NOTE — Telephone Encounter (Signed)
Triglycerides are also elevated so would recommended starting Crestor at 10mg  daily and repeat an FLP and ALTIn 6 weeks

## 2019-09-22 NOTE — Telephone Encounter (Signed)
[facility] to let them know of Dr. Theodosia Blender recommendations.

## 2019-09-22 NOTE — Telephone Encounter (Signed)
Patient had a visit with his PCP, Dr. Jacelyn Grip today and states that they would like to start him on medication for his cholesterol. They would like to know what medications that Dr. Radford Pax would recommend for him.

## 2019-10-06 ENCOUNTER — Ambulatory Visit (INDEPENDENT_AMBULATORY_CARE_PROVIDER_SITE_OTHER): Payer: Medicare Other | Admitting: Interventional Cardiology

## 2019-10-06 DIAGNOSIS — Z5181 Encounter for therapeutic drug level monitoring: Secondary | ICD-10-CM | POA: Diagnosis not present

## 2019-10-06 LAB — PROTIME-INR: INR: 1.8 — AB (ref 0.9–1.1)

## 2019-10-06 NOTE — Patient Instructions (Signed)
Description    Take 10 mg today (2 tablets), then continue on same dosage 7.5mg  daily except 10 mg on Mondays, Wednesdays, and Fridays. Recheck INR in 3 weeks. Call us with any medication changes or concerns # [phone] [facility].

## 2019-10-27 ENCOUNTER — Other Ambulatory Visit: Payer: Self-pay | Admitting: Cardiology

## 2019-10-31 ENCOUNTER — Ambulatory Visit (INDEPENDENT_AMBULATORY_CARE_PROVIDER_SITE_OTHER): Payer: Medicare Other | Admitting: *Deleted

## 2019-10-31 ENCOUNTER — Other Ambulatory Visit: Payer: Self-pay

## 2019-10-31 DIAGNOSIS — I4819 Other persistent atrial fibrillation: Secondary | ICD-10-CM | POA: Diagnosis not present

## 2019-10-31 DIAGNOSIS — I4891 Unspecified atrial fibrillation: Secondary | ICD-10-CM

## 2019-10-31 DIAGNOSIS — Z5181 Encounter for therapeutic drug level monitoring: Secondary | ICD-10-CM

## 2019-10-31 LAB — POCT INR: INR: 1.9 — AB (ref 2.0–3.0)

## 2019-10-31 NOTE — Patient Instructions (Signed)
Description   Take 12.5 mg today (2.5 tablets), then start taking 10mg  daily except 7.5mg  on Sundays, Tuesdays, and Thursdays. Recheck INR in 3 weeks. Call us with any medication changes or concerns # [phone] [facility]in [facility].

## 2019-11-09 ENCOUNTER — Other Ambulatory Visit: Payer: Self-pay | Admitting: Cardiology

## 2019-11-21 ENCOUNTER — Ambulatory Visit: Payer: Medicare Other | Admitting: *Deleted

## 2019-11-21 ENCOUNTER — Other Ambulatory Visit: Payer: Self-pay

## 2019-11-21 DIAGNOSIS — I4891 Unspecified atrial fibrillation: Secondary | ICD-10-CM

## 2019-11-21 DIAGNOSIS — Z5181 Encounter for therapeutic drug level monitoring: Secondary | ICD-10-CM

## 2019-11-21 LAB — POCT INR: INR: 1.5 — AB (ref 2.0–3.0)

## 2019-11-21 NOTE — Patient Instructions (Signed)
Description   Take 3 tablets today then start taking 10mg  daily except 7.5mg  on Sundays, Tuesdays, and Thursdays. Recheck INR in 1 week. Call us with any medication changes or concerns # [phone] [facility].

## 2019-11-29 ENCOUNTER — Other Ambulatory Visit: Payer: Self-pay

## 2019-11-29 ENCOUNTER — Ambulatory Visit (INDEPENDENT_AMBULATORY_CARE_PROVIDER_SITE_OTHER): Payer: Medicare Other | Admitting: *Deleted

## 2019-11-29 DIAGNOSIS — I4891 Unspecified atrial fibrillation: Secondary | ICD-10-CM | POA: Diagnosis not present

## 2019-11-29 DIAGNOSIS — Z5181 Encounter for therapeutic drug level monitoring: Secondary | ICD-10-CM | POA: Diagnosis not present

## 2019-11-29 DIAGNOSIS — I4819 Other persistent atrial fibrillation: Secondary | ICD-10-CM

## 2019-11-29 LAB — POCT INR: INR: 1.7 — AB (ref 2.0–3.0)

## 2019-11-29 NOTE — Patient Instructions (Signed)
Description   Take 2 tablets today then start taking 10mg  daily except 7.5mg  on Thursdays only. Recheck INR in 10 days. Call us with any medication changes or concerns # [phone] [facility].

## 2019-12-09 ENCOUNTER — Other Ambulatory Visit: Payer: Self-pay

## 2019-12-09 ENCOUNTER — Ambulatory Visit: Payer: Medicare Other | Admitting: *Deleted

## 2019-12-09 DIAGNOSIS — Z5181 Encounter for therapeutic drug level monitoring: Secondary | ICD-10-CM

## 2019-12-09 DIAGNOSIS — I4891 Unspecified atrial fibrillation: Secondary | ICD-10-CM | POA: Diagnosis not present

## 2019-12-09 LAB — POCT INR: INR: 1.9 — AB (ref 2.0–3.0)

## 2019-12-09 NOTE — Patient Instructions (Signed)
Description   Take 3 tablets today and then start taking 2 tablets daily. Recheck INR in 2 weeks. Call us with any medication changes or concerns # [phone] Coumadin Clinic.

## 2019-12-23 ENCOUNTER — Ambulatory Visit: Payer: Medicare Other | Admitting: *Deleted

## 2019-12-23 ENCOUNTER — Other Ambulatory Visit: Payer: Self-pay

## 2019-12-23 DIAGNOSIS — I4891 Unspecified atrial fibrillation: Secondary | ICD-10-CM | POA: Diagnosis not present

## 2019-12-23 DIAGNOSIS — I4819 Other persistent atrial fibrillation: Secondary | ICD-10-CM

## 2019-12-23 DIAGNOSIS — Z5181 Encounter for therapeutic drug level monitoring: Secondary | ICD-10-CM

## 2019-12-23 LAB — POCT INR: INR: 2.2 (ref 2.0–3.0)

## 2019-12-23 NOTE — Patient Instructions (Signed)
Description   Continue taking 2 tablets daily. Recheck INR in 3 weeks. Call us with any medication changes or concerns # [phone] Coumadin Clinic.

## 2020-01-13 ENCOUNTER — Ambulatory Visit: Payer: Medicare Other

## 2020-01-13 ENCOUNTER — Other Ambulatory Visit: Payer: Self-pay

## 2020-01-13 DIAGNOSIS — I4819 Other persistent atrial fibrillation: Secondary | ICD-10-CM | POA: Diagnosis not present

## 2020-01-13 DIAGNOSIS — I4891 Unspecified atrial fibrillation: Secondary | ICD-10-CM

## 2020-01-13 DIAGNOSIS — Z5181 Encounter for therapeutic drug level monitoring: Secondary | ICD-10-CM | POA: Diagnosis not present

## 2020-01-13 LAB — POCT INR: INR: 2 (ref 2.0–3.0)

## 2020-01-13 NOTE — Patient Instructions (Signed)
Continue taking 2 tablets daily. Recheck INR in 4 weeks. Call us with any medication changes or concerns # [phone] [facility]madin Clinic.

## 2020-01-16 ENCOUNTER — Telehealth: Payer: Self-pay | Admitting: Hematology and Oncology

## 2020-01-16 NOTE — Telephone Encounter (Signed)
Received a new pt referral from Dr. Penelope Coop at [place] for anemia. Alec Hunt has been cld and scheduled to see Dr. Lindi Adie on 7/27 at 1pm. Pt aware to arrive 15 minutes early.

## 2020-01-23 NOTE — Progress Notes (Signed)
[place] NOTE  Patient Care Team: Vernie Shanks, MD as PCP - General (Family Medicine) Sueanne Margarita, MD as PCP - Cardiology (Cardiology) Sueanne Margarita, MD as Consulting Physician (Cardiology)  CHIEF COMPLAINTS/PURPOSE OF CONSULTATION:  Newly diagnosed anemia  HISTORY OF PRESENTING ILLNESS:  Alec Hunt 69 y.o. male is here because of recent diagnosis of anemia. He is referred by Dr. Anson Fret. Labs on 12/02/19 showed: Hg 10.9, HCT 31.9, MCV 87.9, platelets 278, ferritin 563, iron 61. He presents to the clinic today for initial evaluation.  He had a prior history of prostate cancer that was treated with radiation and is currently on surveillance.  He also received adjuvant GnRH analogs.  His primary care physician referred him to gastroenterologist who subsequently referred him to [place] for discussion regarding his anemia.  Previously he had hemoglobin of 12.5. He denies any shortness of breath with exertion or fatigue or dizziness or lightheadedness.  I reviewed his records extensively and collaborated the history with the patient.   MEDICAL HISTORY:  Past Medical History:  Diagnosis Date  . Arthritis    kness & shoulders  . Bradycardia   . History of syncope 10/2013   in setting new dx atrial fib  . Hypertension   . Mobitz type 1 second degree atrioventricular block   . Morbid obesity ([place])   . Nocturia   . OSA treated with BiPAP    Severe w AHI 81.28/hr now on Bipap  . Persistent atrial fibrillation    cardiologist-  dr Tressia Miners turner-- dx 2014  . Prostate cancer [facility]) urologist-  dr Alyson Ingles  oncologist-  dr Tammi Klippel   dx 06-11-2017--- Stage T1c, Gleason 4+5, PSA 7.65, vol 32.8cc--  scheduled for radiactive seed implants 11-12-2017  . RBBB   . Wears glasses     SURGICAL HISTORY: Past Surgical History:  Procedure Laterality Date  . CARDIAC CATHETERIZATION  1990s  dr t. turner   per pt told normal coronaries  . CARDIOVASCULAR STRESS  TEST  06/16/2012   Low risk nuclear study/  normal LV function and wall motion , ef 61%  . CARDIOVASCULAR STRESS TEST  06/16/2012   Low risk nuclear study w/ no ischemia/ normal LV function and wall motion , ef 61%  . COLONOSCOPY  2012 approx.  . CYSTOSCOPY  11/12/2017   Procedure: Erlene Quan;  Surgeon: Cleon Gustin, MD;  Location: [facility];  Service: Urology;;  . KNEE ARTHROSCOPY Right 1990s  . LUMBAR LAMINECTOMY/DECOMPRESSION MICRODISCECTOMY N/A 09/27/2014   Procedure: LUMBAR LAMINECTOMY/DECOMPRESSION MICRODISCECTOMY 3 LEVELS L4-5 L3-4 POSSIBLE L2-3 BILATERAL FORAMINOTOMIES AT L-3,4 AND 5;  Surgeon: Susa Day, MD;  Location: [facility];  Service: Orthopedics;  Laterality: N/A;  . PROSTATE BIOPSY  06-11-2017   dr Alyson Ingles office  . RADIOACTIVE SEED IMPLANT N/A 11/12/2017   Procedure: RADIOACTIVE SEED IMPLANT/BRACHYTHERAPY IMPLANT;  Surgeon: Cleon Gustin, MD;  Location: [facility];  Service: Urology;  Laterality: N/A;  . ROTATOR CUFF REPAIR Right 2015  . SPACE OAR INSTILLATION N/A 11/12/2017   Procedure: SPACE OAR INSTILLATION;  Surgeon: Cleon Gustin, MD;  Location: [facility];  Service: Urology;  Laterality: N/A;  . TRANSTHORACIC ECHOCARDIOGRAM  05/26/2017   mild LVH, ef 63-14%, grade 2 diastolic dysfunction/  mild dilaged ascending aorta/  trivial PR and TR/  mild RAE  . TRANSTHORACIC ECHOCARDIOGRAM     mild LVH, ef 60-65%, grade 2 diastoli dysfunction/ mild dilated ascending aorta/  trivil PR and TR/  mild RAE  SOCIAL HISTORY: Social History   Socioeconomic History  . Marital status: Married    Spouse name: Not on file  . Number of children: 2  . Years of education: Not on file  . Highest education level: Not on file  Occupational History  . Occupation: retired    Fish farm manager: [employer]  Tobacco Use  . Smoking status: Former Smoker    Packs/day: 0.25    Years: 15.00    Pack years: 3.75    Types: Cigarettes    Quit date: 02/03/2000    Years since quitting:  19.9  . Smokeless tobacco: Never Used  Vaping Use  . Vaping Use: Never used  Substance and Sexual Activity  . Alcohol use: Not Currently    Comment: hx of ETOH use quit at age 47 (31)  . Drug use: No  . Sexual activity: Not on file  Other Topics Concern  . Not on file  Social History Narrative   Married.    Retired from [employer] after 43 years.   3 sons but one passed from a MVA   Social Determinants of Health   Financial Resource Strain:   . Difficulty of Paying Living Expenses:   Food Insecurity:   . Worried About Charity fundraiser in the Last Year:   . Arboriculturist in the Last Year:   Transportation Needs:   . Film/video editor (Medical):   Marland Kitchen Lack of Transportation (Non-Medical):   Physical Activity:   . Days of Exercise per Week:   . Minutes of Exercise per Session:   Stress:   . Feeling of Stress :   Social Connections:   . Frequency of Communication with Friends and Family:   . Frequency of Social Gatherings with Friends and Family:   . Attends Religious Services:   . Active Member of Clubs or Organizations:   . Attends Archivist Meetings:   Marland Kitchen Marital Status:   Intimate Partner Violence:   . Fear of Current or Ex-Partner:   . Emotionally Abused:   Marland Kitchen Physically Abused:   . Sexually Abused:     FAMILY HISTORY: Family History  Problem Relation Age of Onset  . CAD Mother   . Heart attack Mother   . Heart disease Mother   . Hypertension Brother   . Cancer Neg Hx     ALLERGIES:  has No Known Allergies.  MEDICATIONS:  Current Outpatient Medications  Medication Sig Dispense Refill  . acetaminophen (TYLENOL) 500 MG tablet Take 1,000 mg by mouth 2 (two) times daily as needed for moderate pain.    Marland Kitchen alfuzosin (UROXATRAL) 10 MG 24 hr tablet Take 10 mg by mouth at bedtime.    . hydrochlorothiazide (HYDRODIURIL) 25 MG tablet TAKE 1 TABLET BY MOUTH EVERY DAY 90 tablet 3  . losartan (COZAAR) 100 MG tablet TAKE 1 TABLET BY MOUTH EVERY DAY  IN THE MORNING 90 tablet 3  . metFORMIN (GLUCOPHAGE) 500 MG tablet Take 1,000 mg by mouth 2 (two) times daily.     . solifenacin (VESICARE) 5 MG tablet Take 5 mg by mouth daily.    Marland Kitchen warfarin (COUMADIN) 5 MG tablet TAKE 1.5 TO 2 TABLETS DAILY BY MOUTH AS DIRECTED BY COUMADIN CLINIC 180 tablet 0   No current facility-administered medications for this visit.    REVIEW OF SYSTEMS:   Constitutional: Denies fevers, chills or abnormal night sweats Eyes: Denies blurriness of vision, double vision or watery eyes Ears, nose, mouth, throat,  and face: Denies mucositis or sore throat Respiratory: Denies cough, dyspnea or wheezes Cardiovascular: Denies palpitation, chest discomfort or lower extremity swelling Gastrointestinal:  Denies nausea, heartburn or change in bowel habits Skin: Denies abnormal skin rashes Lymphatics: Denies new lymphadenopathy or easy bruising Neurological:Denies numbness, tingling or new weaknesses Behavioral/Psych: Mood is stable, no new changes  Breast: Denies any palpable lumps or discharge All other systems were reviewed with the patient and are negative.  PHYSICAL EXAMINATION: ECOG PERFORMANCE STATUS: 1 - Symptomatic but completely ambulatory  Vitals:   01/24/20 1302  BP: (!) 136/72  Pulse: 59  Resp: 17  Temp: 98.5 F (36.9 C)  SpO2: 100%   Filed Weights   01/24/20 1302  Weight: (!) 314 lb 6.4 oz (142.6 kg)    GENERAL:alert, no distress and comfortable SKIN: skin color, texture, turgor are normal, no rashes or significant lesions EYES: normal, conjunctiva are pink and non-injected, sclera clear OROPHARYNX:no exudate, no erythema and lips, buccal mucosa, and tongue normal  NECK: supple, thyroid normal size, non-tender, without nodularity LYMPH:  no palpable lymphadenopathy in the cervical, axillary or inguinal LUNGS: clear to auscultation and percussion with normal breathing effort HEART: regular rate & rhythm and no murmurs and no lower extremity  edema ABDOMEN:abdomen soft, non-tender and normal bowel sounds Musculoskeletal:no cyanosis of digits and no clubbing  PSYCH: alert & oriented x 3 with fluent speech NEURO: no focal motor/sensory deficits  LABORATORY DATA:  I have reviewed the data as listed Lab Results  Component Value Date   WBC 6.3 11/09/2017   HGB 11.5 (L) 11/09/2017   HCT 35.1 (L) 11/09/2017   MCV 90.5 11/09/2017   PLT 249 11/09/2017   Lab Results  Component Value Date   NA 138 08/16/2018   K 3.9 08/16/2018   CL 102 08/16/2018   CO2 22 08/16/2018    RADIOGRAPHIC STUDIES: I have personally reviewed the radiological reports and agreed with the findings in the report.  ASSESSMENT AND PLAN:  Normocytic anemia Labs on  08/23/2019: Hemoglobin 12.5, MCV 87.2 12/02/19 showed: Hg 10.9, HCT 31.9, MCV 87.9, platelets 278, ferritin 563, iron 61  Differential diagnosis: 1. Anemia due to chronic disease and inflammation 2. Hemolysis 3. Hypothyroidism 4. Plasma cell disorders myeloma   Workup performed: 1. CBC with differential to evaluate the smear 2. CMP to evaluate liver and kidney function: Previously performed and it was normal 3. Haptoglobin, LDH, reticulocyte count to evaluate hemolysis 4. TSH 5. SPEP 6. B-12 levels  Patient has diabetes and obstructive sleep apnea and has lost about 30 to 40 pounds by watching what he eats and by exercising.  He does not have any symptoms of anemia.  In fact he feels energized since he lost this weight. He takes Coumadin for atrial fibrillation history.  He has not noticed any bleeding.  He was seen by gastroenterology.  MyChart virtual visit next Monday to go over the results of today's blood work.    All questions were answered. The patient knows to call the clinic with any problems, questions or concerns.   Rulon Eisenmenger, MD, MPH 01/24/2020    I, Molly Dorshimer, am acting as scribe for Nicholas Lose, MD.  I have reviewed the above documentation for accuracy  and completeness, and I agree with the above.

## 2020-01-24 ENCOUNTER — Inpatient Hospital Stay: Payer: Medicare Other | Attending: Hematology and Oncology | Admitting: Hematology and Oncology

## 2020-01-24 ENCOUNTER — Inpatient Hospital Stay: Payer: Medicare Other

## 2020-01-24 ENCOUNTER — Other Ambulatory Visit: Payer: Self-pay

## 2020-01-24 DIAGNOSIS — G4733 Obstructive sleep apnea (adult) (pediatric): Secondary | ICD-10-CM | POA: Diagnosis not present

## 2020-01-24 DIAGNOSIS — I4891 Unspecified atrial fibrillation: Secondary | ICD-10-CM

## 2020-01-24 DIAGNOSIS — Z7901 Long term (current) use of anticoagulants: Secondary | ICD-10-CM | POA: Diagnosis not present

## 2020-01-24 DIAGNOSIS — E119 Type 2 diabetes mellitus without complications: Secondary | ICD-10-CM

## 2020-01-24 DIAGNOSIS — Z87891 Personal history of nicotine dependence: Secondary | ICD-10-CM | POA: Diagnosis not present

## 2020-01-24 DIAGNOSIS — D649 Anemia, unspecified: Secondary | ICD-10-CM

## 2020-01-24 LAB — CBC WITH DIFFERENTIAL (CANCER CENTER ONLY)
Abs Immature Granulocytes: 0.04 10*3/uL (ref 0.00–0.07)
Basophils Absolute: 0 10*3/uL (ref 0.0–0.1)
Basophils Relative: 0 %
Eosinophils Absolute: 0.5 10*3/uL (ref 0.0–0.5)
Eosinophils Relative: 9 %
HCT: 32.8 % — ABNORMAL LOW (ref 39.0–52.0)
Hemoglobin: 10.5 g/dL — ABNORMAL LOW (ref 13.0–17.0)
Immature Granulocytes: 1 %
Lymphocytes Relative: 16 %
Lymphs Abs: 0.9 10*3/uL (ref 0.7–4.0)
MCH: 28.3 pg (ref 26.0–34.0)
MCHC: 32 g/dL (ref 30.0–36.0)
MCV: 88.4 fL (ref 80.0–100.0)
Monocytes Absolute: 0.3 10*3/uL (ref 0.1–1.0)
Monocytes Relative: 6 %
Neutro Abs: 3.8 10*3/uL (ref 1.7–7.7)
Neutrophils Relative %: 68 %
Platelet Count: 230 10*3/uL (ref 150–400)
RBC: 3.71 MIL/uL — ABNORMAL LOW (ref 4.22–5.81)
RDW: 13.4 % (ref 11.5–15.5)
WBC Count: 5.6 10*3/uL (ref 4.0–10.5)
nRBC: 0 % (ref 0.0–0.2)

## 2020-01-24 LAB — TSH: TSH: 1.365 u[IU]/mL (ref 0.320–4.118)

## 2020-01-24 LAB — LACTATE DEHYDROGENASE: LDH: 129 U/L (ref 98–192)

## 2020-01-24 LAB — VITAMIN B12: Vitamin B-12: 232 pg/mL (ref 180–914)

## 2020-01-24 NOTE — Assessment & Plan Note (Signed)
Labs on 12/02/19 showed: Hg 10.9, HCT 31.9, MCV 87.9, platelets 278, ferritin 563, iron 61  Differential diagnosis: 1. Anemia due to chronic disease and inflammation 2. anemia due to renal dysfunction 3. Combined B-12 and iron deficiency anemias 4. Hemolysis 5. Hypothyroidism 6. Plasma cell disorders myeloma 7. Bone marrow dysfunction with MDS  Workup performed: 1. CBC with differential to evaluate the smear 2. CMP to evaluate liver and kidney function 3. Haptoglobin, LDH, reticulocyte count to evaluate hemolysis 4. TSH 5. SPEP 6. Iron and F-79 and folic acid levels

## 2020-01-25 ENCOUNTER — Telehealth: Payer: Self-pay | Admitting: Hematology and Oncology

## 2020-01-25 LAB — ERYTHROPOIETIN: Erythropoietin: 13.9 m[IU]/mL (ref 2.6–18.5)

## 2020-01-25 LAB — HAPTOGLOBIN: Haptoglobin: 260 mg/dL (ref 32–363)

## 2020-01-25 NOTE — Telephone Encounter (Signed)
Scheduled per 7/27 los. Called and spoke with pt, confirmed 8/2 appt

## 2020-01-26 LAB — MULTIPLE MYELOMA PANEL, SERUM
Albumin SerPl Elph-Mcnc: 3.5 g/dL (ref 2.9–4.4)
Albumin/Glob SerPl: 0.9 (ref 0.7–1.7)
Alpha 1: 0.2 g/dL (ref 0.0–0.4)
Alpha2 Glob SerPl Elph-Mcnc: 0.9 g/dL (ref 0.4–1.0)
B-Globulin SerPl Elph-Mcnc: 0.9 g/dL (ref 0.7–1.3)
Gamma Glob SerPl Elph-Mcnc: 1.9 g/dL — ABNORMAL HIGH (ref 0.4–1.8)
Globulin, Total: 4 g/dL — ABNORMAL HIGH (ref 2.2–3.9)
IgA: 87 mg/dL (ref 61–437)
IgG (Immunoglobin G), Serum: 2298 mg/dL — ABNORMAL HIGH (ref 603–1613)
IgM (Immunoglobulin M), Srm: 26 mg/dL (ref 20–172)
M Protein SerPl Elph-Mcnc: 1.2 g/dL — ABNORMAL HIGH
Total Protein ELP: 7.5 g/dL (ref 6.0–8.5)

## 2020-01-29 NOTE — Progress Notes (Signed)
  HEMATOLOGY-ONCOLOGY TELEPHONE VISIT PROGRESS NOTE  I connected with Alec Hunt on 01/30/2020 at  8:45 AM EDT by telephone and verified that I am speaking with the correct person using two identifiers.  I discussed the limitations, risks, security and privacy concerns of performing an evaluation and management service by telephone and the availability of in person appointments.  I also discussed with the patient that there may be a patient responsible charge related to this service. The patient expressed understanding and agreed to proceed.   History of Present Illness: Alec Hunt is a 69 y.o. male with above-mentioned history of anemia. Labs on 01/24/20 showed: Hg 10.5, HCT 32.8, MCV 88.4, platelets 230, erythropoietin 13.9, haptoglobin 260, B-12 232, LDH 129, TSH 1.365, m-protein 1.2. He presents over the phone today to discuss his labs.    Observations/Objective: No new symptoms or concerns   Assessment Plan:  Normocytic anemia Labs: 08/23/2019: Hemoglobin 12.5, MCV 87.2, serum creatinine 1.02 12/02/19 showed: Hg 10.9, HCT 31.9, MCV 87.9, platelets 278, ferritin 563, iron 61, creatinine 0.97 01/24/2020: Hemoglobin 10.5, MCV 88.4, TSH 1.36, LDH 129, M protein 1.2 g IgG lambda, IgG 2298, B12 232, haptoglobin 260, erythropoietin 13.9  Based upon the elevation of monoclonal protein and his current anemia situation, we need to perform a bone marrow biopsy.  This is to rule out multiple myeloma.  There is no evidence of hemolysis and the peripheral blood smear does not show any abnormal cells.  If his bone marrow looks normal then we can attribute his anemia to low erythropoietin levels secondary to anemia of chronic disease and marked consider treating him with erythropoietin stimulating agents like Aranesp if the hemoglobin drops below 10. Is currently asymptomatic from the anemia.   I discussed the assessment and treatment plan with the patient. The patient was provided an  opportunity to ask questions and all were answered. The patient agreed with the plan and demonstrated an understanding of the instructions. The patient was advised to call back or seek an in-person evaluation if the symptoms worsen or if the condition fails to improve as anticipated.   I provided 15 minutes of non-face-to-face time during this encounter.   Rulon Eisenmenger, MD 01/30/2020    I, Molly Dorshimer, am acting as scribe for Nicholas Lose, MD.  I have reviewed the above documentation for accuracy and completeness, and I agree with the above.

## 2020-01-30 ENCOUNTER — Telehealth: Payer: Self-pay

## 2020-01-30 ENCOUNTER — Inpatient Hospital Stay: Payer: Medicare Other | Attending: Hematology and Oncology | Admitting: Hematology and Oncology

## 2020-01-30 DIAGNOSIS — D649 Anemia, unspecified: Secondary | ICD-10-CM | POA: Insufficient documentation

## 2020-01-30 DIAGNOSIS — D472 Monoclonal gammopathy: Secondary | ICD-10-CM | POA: Insufficient documentation

## 2020-01-30 NOTE — Telephone Encounter (Signed)
Called pt to discuss BMBX schedule. Pt agreed to come 02/01/20 at 0730 for BMBX. Messages sent to Wilber Bihari, NP and scheduling. [facility] is also aware. Pt understands he will come for bx and labs.

## 2020-01-30 NOTE — Assessment & Plan Note (Signed)
Labs: 08/23/2019: Hemoglobin 12.5, MCV 87.2, serum creatinine 1.02 12/02/19 showed: Hg 10.9, HCT 31.9, MCV 87.9, platelets 278, ferritin 563, iron 61, creatinine 0.97 01/24/2020: Hemoglobin 10.5, MCV 88.4, TSH 1.36, LDH 129, M protein 1.2 g IgG lambda, IgG 2298, B12 232, haptoglobin 260, erythropoietin 13.9  Based upon the elevation of monoclonal protein and his current anemia situation, we need to perform a bone marrow biopsy.  This is to rule out multiple myeloma.  There is no evidence of hemolysis and the peripheral blood smear does not show any abnormal cells.  If his bone marrow looks normal then we can attribute his anemia to low erythropoietin levels secondary to anemia of chronic disease and marked consider treating him with erythropoietin stimulating agents like Aranesp if the hemoglobin drops below 10. Is currently asymptomatic from the anemia.

## 2020-01-31 ENCOUNTER — Other Ambulatory Visit: Payer: Self-pay | Admitting: *Deleted

## 2020-01-31 DIAGNOSIS — D649 Anemia, unspecified: Secondary | ICD-10-CM

## 2020-02-01 ENCOUNTER — Telehealth: Payer: Self-pay | Admitting: Hematology and Oncology

## 2020-02-01 ENCOUNTER — Other Ambulatory Visit: Payer: Self-pay | Admitting: Cardiology

## 2020-02-01 ENCOUNTER — Other Ambulatory Visit: Payer: Self-pay

## 2020-02-01 ENCOUNTER — Inpatient Hospital Stay (HOSPITAL_BASED_OUTPATIENT_CLINIC_OR_DEPARTMENT_OTHER): Payer: Medicare Other | Admitting: Hematology and Oncology

## 2020-02-01 ENCOUNTER — Inpatient Hospital Stay: Payer: Medicare Other

## 2020-02-01 VITALS — BP 141/81 | HR 58 | Temp 97.9°F | Resp 17 | Ht 73.0 in | Wt 314.8 lb

## 2020-02-01 DIAGNOSIS — D649 Anemia, unspecified: Secondary | ICD-10-CM

## 2020-02-01 DIAGNOSIS — D472 Monoclonal gammopathy: Secondary | ICD-10-CM

## 2020-02-01 LAB — CBC WITH DIFFERENTIAL (CANCER CENTER ONLY)
Abs Immature Granulocytes: 0.03 10*3/uL (ref 0.00–0.07)
Basophils Absolute: 0 10*3/uL (ref 0.0–0.1)
Basophils Relative: 0 %
Eosinophils Absolute: 0.5 10*3/uL (ref 0.0–0.5)
Eosinophils Relative: 10 %
HCT: 32.2 % — ABNORMAL LOW (ref 39.0–52.0)
Hemoglobin: 10.5 g/dL — ABNORMAL LOW (ref 13.0–17.0)
Immature Granulocytes: 1 %
Lymphocytes Relative: 15 %
Lymphs Abs: 0.8 10*3/uL (ref 0.7–4.0)
MCH: 29 pg (ref 26.0–34.0)
MCHC: 32.6 g/dL (ref 30.0–36.0)
MCV: 89 fL (ref 80.0–100.0)
Monocytes Absolute: 0.5 10*3/uL (ref 0.1–1.0)
Monocytes Relative: 9 %
Neutro Abs: 3.6 10*3/uL (ref 1.7–7.7)
Neutrophils Relative %: 65 %
Platelet Count: 216 10*3/uL (ref 150–400)
RBC: 3.62 MIL/uL — ABNORMAL LOW (ref 4.22–5.81)
RDW: 13.3 % (ref 11.5–15.5)
WBC Count: 5.4 10*3/uL (ref 4.0–10.5)
nRBC: 0 % (ref 0.0–0.2)

## 2020-02-01 NOTE — Patient Instructions (Signed)
Bone Marrow Aspiration and Bone Marrow Biopsy, Adult, Care After This sheet gives you information about how to care for yourself after your procedure. Your health care provider may also give you more specific instructions. If you have problems or questions, contact your health care provider. What can I expect after the procedure? After the procedure, it is common to have:  Mild pain and tenderness.  Swelling.  Bruising. Follow these instructions at home: Puncture site care   Follow instructions from your health care provider about how to take care of the puncture site. Make sure you: ? Wash your hands with soap and water before and after you change your bandage (dressing). If soap and water are not available, use hand sanitizer. ? Change your dressing as told by your health care provider.  Check your puncture site every day for signs of infection. Check for: ? More redness, swelling, or pain. ? Fluid or blood. ? Warmth. ? Pus or a bad smell. Activity  Return to your normal activities as told by your health care provider. Ask your health care provider what activities are safe for you.  Do not lift anything that is heavier than 10 lb (4.5 kg), or the limit that you are told, until your health care provider says that it is safe.  Do not drive for 24 hours if you were given a sedative during your procedure. General instructions   Take over-the-counter and prescription medicines only as told by your health care provider.  Do not take baths, swim, or use a hot tub until your health care provider approves. Ask your health care provider if you may take showers. You may only be allowed to take sponge baths.  If directed, put ice on the affected area. To do this: ? Put ice in a plastic bag. ? Place a towel between your skin and the bag. ? Leave the ice on for 20 minutes, 2-3 times a day.  Keep all follow-up visits as told by your health care provider. This is important. Contact a  health care provider if:  Your pain is not controlled with medicine.  You have a fever.  You have more redness, swelling, or pain around the puncture site.  You have fluid or blood coming from the puncture site.  Your puncture site feels warm to the touch.  You have pus or a bad smell coming from the puncture site. Summary  After the procedure, it is common to have mild pain, tenderness, swelling, and bruising.  Follow instructions from your health care provider about how to take care of the puncture site and what activities are safe for you.  Take over-the-counter and prescription medicines only as told by your health care provider.  Contact a health care provider if you have any signs of infection, such as fluid or blood coming from the puncture site. This information is not intended to replace advice given to you by your health care provider. Make sure you discuss any questions you have with your health care provider. Document Revised: 11/02/2018 Document Reviewed: 11/02/2018 Elsevier Patient Education  2020 [employer]  

## 2020-02-01 NOTE — Telephone Encounter (Signed)
Scheduled per 8/2 los. Called and left a msg, mailing appt letter and calendar printout

## 2020-02-01 NOTE — Progress Notes (Addendum)
INDICATION: MGUS, r/o myeloma  Brief examination was performed. ENT: adequate airway clearance Heart: regular rate and rhythm.No Murmurs Lungs: clear to auscultation, no wheezes, normal respiratory effort  Bone Marrow Biopsy and Aspiration Procedure Note   Informed consent was obtained and potential risks including bleeding, infection and pain were reviewed with the patient.  The patient's name, date of birth, identification, consent and allergies were verified prior to the start of procedure and time out was performed.  The left posterior iliac crest was chosen as the site of biopsy.  The skin was prepped with ChloraPrep.   16 cc of 2% lidocaine was used to provide local anaesthesia.   8 cc of bone marrow aspirate was obtained followed by 1.5cm biopsy. Patient with very strong bones, Dr. Lindi Adie assisted with obtaining core biopsy.    Pressure was applied to the biopsy site and bandage was placed over the biopsy site. Patient was made to lie on the back for 30 mins prior to discharge.  The procedure was tolerated well. COMPLICATIONS: None BLOOD LOSS: none The patient was discharged home in stable condition with a 1 week follow up to review results.  Patient was provided with post bone marrow biopsy instructions and instructed to call if there was any bleeding or worsening pain.  Specimens sent for flow cytometry, cytogenetics and additional studies.  Signed Scot Dock, NP  ADDENDUM: I personally performed the bone marrow biopsy and aspiration.  The above procedure note is applicable to the procedure that I performed personally. The aspirate unfortunately had very few spicules but we were able to obtain a 1 cm of the biopsy. Patient did not experience any pain or discomfort in this process. I will see him back in 1 week to go over the results.

## 2020-02-01 NOTE — Progress Notes (Signed)
Patient underwent bone marrow biopsy this morning starting at 0755 and ended at [place]. Patient observed for 30 minutes following procedure. Patient educated on follow-up care and verbalized an understanding of the teaching. Site assessed with some bleeding noted. Dressing reinforced and no additional signs of bleeding noted at site.

## 2020-02-02 ENCOUNTER — Telehealth: Payer: Self-pay | Admitting: Hematology and Oncology

## 2020-02-02 NOTE — Telephone Encounter (Signed)
No 8/4 los, no changes made to pt schedule  

## 2020-02-03 LAB — SURGICAL PATHOLOGY

## 2020-02-08 NOTE — Progress Notes (Signed)
Patient Care Team: Vernie Shanks, MD as PCP - General (Family Medicine) Sueanne Margarita, MD as PCP - Cardiology (Cardiology) Sueanne Margarita, MD as Consulting Physician (Cardiology)  DIAGNOSIS:    ICD-10-CM   1. 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 with above-mentioned history of anemia who underwent a bone marrow biopsy on 02/01/20. He presents to the clinic today to review the pathology report and discuss further treatment.   ALLERGIES:  has No Known Allergies.  MEDICATIONS:  Current Outpatient Medications  Medication Sig Dispense Refill  . acetaminophen (TYLENOL) 500 MG tablet Take 1,000 mg by mouth 2 (two) times daily as needed for moderate pain.    Marland Kitchen alfuzosin (UROXATRAL) 10 MG 24 hr tablet Take 10 mg by mouth at bedtime.    . hydrochlorothiazide (HYDRODIURIL) 25 MG tablet TAKE 1 TABLET BY MOUTH EVERY DAY 90 tablet 3  . losartan (COZAAR) 100 MG tablet TAKE 1 TABLET BY MOUTH EVERY DAY IN THE MORNING 90 tablet 3  . metFORMIN (GLUCOPHAGE) 500 MG tablet Take 1,000 mg by mouth 2 (two) times daily.     . solifenacin (VESICARE) 5 MG tablet Take 5 mg by mouth daily.    Marland Kitchen warfarin (COUMADIN) 5 MG tablet TAKE 1.5 TO 2 TABLETS DAILY BY MOUTH AS DIRECTED BY COUMADIN CLINIC 180 tablet 1   No current facility-administered medications for this visit.    PHYSICAL EXAMINATION: ECOG PERFORMANCE STATUS: 1 - Symptomatic but completely ambulatory  Vitals:   02/09/20 0856  BP: 120/74  Pulse: (!) 57  Resp: 17  Temp: 98.5 F (36.9 C)  SpO2: 100%   Filed Weights   02/09/20 0856  Weight: (!) 320 lb 8 oz (145.4 kg)    LABORATORY DATA:  I have reviewed the data as listed CMP  Latest Ref Rng & Units 08/16/2018 11/09/2017 06/10/2017  Glucose 65 - 99 mg/dL 121(H) 134(H) 114(H)  BUN 8 - 27 mg/dL 13 18 18   Creatinine 0.76 - 1.27 mg/dL 0.91 0.93 1.10  Sodium 134 - 144 mmol/L 138 139 137  Potassium 3.5 - 5.2 mmol/L 3.9 3.9 4.5  Chloride 96 - 106 mmol/L 102 105 98  CO2 20 - 29 mmol/L 22 23 25   Calcium 8.6 - 10.2 mg/dL 9.6 9.5 10.1  Total Protein 6.5 - 8.1 g/dL - 8.2(H) -  Total Bilirubin 0.3 - 1.2 mg/dL - 0.4 -  Alkaline Phos 38 - 126 U/L - 68 -  AST 15 - 41 U/L - 17 -  ALT 17 - 63 U/L - 21 -    Lab Results  Component Value Date   WBC 5.4 02/01/2020   HGB 10.5 (L) 02/01/2020   HCT 32.2 (L) 02/01/2020   MCV 89.0 02/01/2020   PLT 216 02/01/2020   NEUTROABS 3.6 02/01/2020    ASSESSMENT & PLAN:  MGUS (monoclonal gammopathy of unknown significance) 08/23/2019: Hemoglobin 12.5, MCV 87.2, serum creatinine 1.02 12/02/19 showed: Hg 10.9, HCT 31.9, MCV 87.9, platelets 278, ferritin 563, iron 61, creatinine 0.97 01/24/2020: Hemoglobin 10.5, MCV 88.4, TSH 1.36, LDH 129, M protein 1.2 g IgG lambda, IgG 2298, B12 232, haptoglobin 260, erythropoietin 13.9  Bone marrow biopsy 02/01/2020:  Limited marrow aspirate at 2% plasma cells and core biopsy about 5% plasma cells.  Light chain in situ hybridization shows polytypic population.  Even though the bone marrow biopsy is not adequate I do not see any evidence of multiple myeloma.  Treatment plan: Monitor serum protein electrophoresis every 6 months for a year and then annually thereafter  Normocytic anemia: With low erythropoietin levels.  As long as the hemoglobin stays above 10 we do not need to treat with erythropoietin stimulating agents.  Return to clinic in 6 months with labs done 1 week ahead of time  Orders Placed This Encounter  Procedures  . Kappa/lambda light chains    Standing Status:   Future    Standing Expiration Date:   02/08/2021  . Multiple Myeloma Panel (SPEP&IFE w/QIG)    Standing Status:   Future     Standing Expiration Date:   02/08/2021  . Beta 2 microglobulin, serum    Standing Status:   Future    Standing Expiration Date:   02/08/2021  . CBC with Differential ([facility] Only)    Standing Status:   Future    Standing Expiration Date:   02/08/2021  . CMP ([facility] only)    Standing Status:   Future    Standing Expiration Date:   02/08/2021   The patient has a good understanding of the overall plan. he agrees with it. he will call with any problems that may develop before the next visit here.  Total time spent: 20 mins including face to face time and time spent for planning, charting and coordination of care  Nicholas Lose, MD 02/09/2020  I, Cloyde Reams Dorshimer, am acting as scribe for Dr. Nicholas Lose.  I have reviewed the above documentation for accuracy and completeness, and I agree with the above.

## 2020-02-09 ENCOUNTER — Telehealth: Payer: Self-pay | Admitting: Hematology and Oncology

## 2020-02-09 ENCOUNTER — Other Ambulatory Visit: Payer: Self-pay

## 2020-02-09 ENCOUNTER — Inpatient Hospital Stay: Payer: Medicare Other | Admitting: Hematology and Oncology

## 2020-02-09 DIAGNOSIS — D472 Monoclonal gammopathy: Secondary | ICD-10-CM | POA: Diagnosis not present

## 2020-02-09 NOTE — Assessment & Plan Note (Signed)
08/23/2019: Hemoglobin 12.5, MCV 87.2, serum creatinine 1.02 12/02/19 showed: Hg 10.9, HCT 31.9, MCV 87.9, platelets 278, ferritin 563, iron 61, creatinine 0.97 01/24/2020: Hemoglobin 10.5, MCV 88.4, TSH 1.36, LDH 129, M protein 1.2 g IgG lambda, IgG 2298, B12 232, haptoglobin 260, erythropoietin 13.9  Bone marrow biopsy 02/01/2020: Limited marrow aspirate at 2% plasma cells and core biopsy about 5% plasma cells.  Light chain in situ hybridization shows polytypic population.  Even though the bone marrow biopsy is not adequate I do not see any evidence of multiple myeloma.  Treatment plan: Monitor serum protein electrophoresis every 6 months for a year and then annually thereafter  Normocytic anemia: With low erythropoietin levels.  As long as the hemoglobin stays above 10 we do not need to treat with erythropoietin stimulating agents.

## 2020-02-09 NOTE — Telephone Encounter (Signed)
Scheduled appts per 8/12 los. Gave pt a print out of AVS.  °

## 2020-02-10 ENCOUNTER — Ambulatory Visit: Payer: Medicare Other | Admitting: Pharmacist

## 2020-02-10 ENCOUNTER — Encounter (HOSPITAL_COMMUNITY): Payer: Self-pay | Admitting: Hematology and Oncology

## 2020-02-10 DIAGNOSIS — I4819 Other persistent atrial fibrillation: Secondary | ICD-10-CM | POA: Diagnosis not present

## 2020-02-10 DIAGNOSIS — Z5181 Encounter for therapeutic drug level monitoring: Secondary | ICD-10-CM

## 2020-02-10 DIAGNOSIS — I4891 Unspecified atrial fibrillation: Secondary | ICD-10-CM

## 2020-02-10 LAB — POCT INR: INR: 2.3 (ref 2.0–3.0)

## 2020-02-10 NOTE — Patient Instructions (Signed)
Continue taking 2 tablets daily. Recheck INR in 5 weeks. Call us with any medication changes or concerns # [phone] [facility].

## 2020-02-13 ENCOUNTER — Encounter (HOSPITAL_COMMUNITY): Payer: Self-pay | Admitting: Hematology and Oncology

## 2020-03-16 ENCOUNTER — Other Ambulatory Visit: Payer: Self-pay

## 2020-03-16 ENCOUNTER — Ambulatory Visit: Payer: Medicare Other | Admitting: Pharmacist

## 2020-03-16 DIAGNOSIS — I4819 Other persistent atrial fibrillation: Secondary | ICD-10-CM | POA: Diagnosis not present

## 2020-03-16 DIAGNOSIS — Z5181 Encounter for therapeutic drug level monitoring: Secondary | ICD-10-CM | POA: Diagnosis not present

## 2020-03-16 DIAGNOSIS — I4891 Unspecified atrial fibrillation: Secondary | ICD-10-CM

## 2020-03-16 LAB — POCT INR: INR: 2.1 (ref 2.0–3.0)

## 2020-03-16 NOTE — Patient Instructions (Addendum)
Description   Continue taking 2 tablets daily. Recheck INR in 6 weeks. Call us with any medication changes or concerns # [phone] [facility].

## 2020-04-27 ENCOUNTER — Other Ambulatory Visit: Payer: Self-pay

## 2020-04-27 ENCOUNTER — Ambulatory Visit: Payer: Medicare Other | Admitting: *Deleted

## 2020-04-27 DIAGNOSIS — I4891 Unspecified atrial fibrillation: Secondary | ICD-10-CM | POA: Diagnosis not present

## 2020-04-27 DIAGNOSIS — Z5181 Encounter for therapeutic drug level monitoring: Secondary | ICD-10-CM

## 2020-04-27 DIAGNOSIS — I4819 Other persistent atrial fibrillation: Secondary | ICD-10-CM | POA: Diagnosis not present

## 2020-04-27 LAB — POCT INR: INR: 2.2 (ref 2.0–3.0)

## 2020-04-27 NOTE — Patient Instructions (Signed)
Description   Continue taking 2 tablets daily. Recheck INR in 8 weeks. Call us with any medication changes or concerns # [phone] [facility].      

## 2020-06-15 ENCOUNTER — Ambulatory Visit: Payer: Medicare Other | Attending: Internal Medicine

## 2020-06-15 DIAGNOSIS — Z23 Encounter for immunization: Secondary | ICD-10-CM

## 2020-06-15 NOTE — Progress Notes (Signed)
   Covid-19 Vaccination Clinic  Name:  HA PLACERES    MRN: [id number] DOB: [birth year]  06/15/2020  Mr. Kushner was observed post Covid-19 immunization for 15 minutes without incident. He was provided with Vaccine Information Sheet and instruction to access the V-Safe system.   Mr. Ojeda was instructed to call 911 with any severe reactions post vaccine: Marland Kitchen Difficulty breathing  . Swelling of face and throat  . A fast heartbeat  . A bad rash all over body  . Dizziness and weakness   Immunizations Administered    Name Date Dose VIS Date Route   Pfizer COVID-19 Vaccine 06/15/2020  1:17 PM 0.3 mL 04/18/2020 Intramuscular   Manufacturer: [place]   Lot: [id number]   [place]: [id number]

## 2020-06-21 ENCOUNTER — Other Ambulatory Visit: Payer: Self-pay

## 2020-06-21 ENCOUNTER — Ambulatory Visit: Payer: Medicare Other | Admitting: *Deleted

## 2020-06-21 DIAGNOSIS — Z5181 Encounter for therapeutic drug level monitoring: Secondary | ICD-10-CM

## 2020-06-21 DIAGNOSIS — I4891 Unspecified atrial fibrillation: Secondary | ICD-10-CM | POA: Diagnosis not present

## 2020-06-21 DIAGNOSIS — I4819 Other persistent atrial fibrillation: Secondary | ICD-10-CM | POA: Diagnosis not present

## 2020-06-21 LAB — POCT INR: INR: 2.7 (ref 2.0–3.0)

## 2020-06-21 NOTE — Patient Instructions (Signed)
Description   Continue taking 2 tablets daily. Recheck INR in 8 weeks. Call us with any medication changes or concerns # [phone] [facility].      

## 2020-07-02 DIAGNOSIS — G4731 Primary central sleep apnea: Secondary | ICD-10-CM | POA: Diagnosis not present

## 2020-07-02 DIAGNOSIS — G4733 Obstructive sleep apnea (adult) (pediatric): Secondary | ICD-10-CM | POA: Diagnosis not present

## 2020-07-29 ENCOUNTER — Other Ambulatory Visit: Payer: Self-pay | Admitting: Cardiology

## 2020-08-02 DIAGNOSIS — I4891 Unspecified atrial fibrillation: Secondary | ICD-10-CM | POA: Diagnosis not present

## 2020-08-02 DIAGNOSIS — E1165 Type 2 diabetes mellitus with hyperglycemia: Secondary | ICD-10-CM | POA: Diagnosis not present

## 2020-08-02 DIAGNOSIS — E7800 Pure hypercholesterolemia, unspecified: Secondary | ICD-10-CM | POA: Diagnosis not present

## 2020-08-02 DIAGNOSIS — E1169 Type 2 diabetes mellitus with other specified complication: Secondary | ICD-10-CM | POA: Diagnosis not present

## 2020-08-02 DIAGNOSIS — E785 Hyperlipidemia, unspecified: Secondary | ICD-10-CM | POA: Diagnosis not present

## 2020-08-02 DIAGNOSIS — D649 Anemia, unspecified: Secondary | ICD-10-CM | POA: Diagnosis not present

## 2020-08-02 DIAGNOSIS — D638 Anemia in other chronic diseases classified elsewhere: Secondary | ICD-10-CM | POA: Diagnosis not present

## 2020-08-02 DIAGNOSIS — I480 Paroxysmal atrial fibrillation: Secondary | ICD-10-CM | POA: Diagnosis not present

## 2020-08-02 DIAGNOSIS — I10 Essential (primary) hypertension: Secondary | ICD-10-CM | POA: Diagnosis not present

## 2020-08-07 ENCOUNTER — Inpatient Hospital Stay: Payer: Medicare Other | Attending: Hematology and Oncology

## 2020-08-07 ENCOUNTER — Other Ambulatory Visit: Payer: Self-pay

## 2020-08-07 DIAGNOSIS — D472 Monoclonal gammopathy: Secondary | ICD-10-CM | POA: Insufficient documentation

## 2020-08-07 DIAGNOSIS — D649 Anemia, unspecified: Secondary | ICD-10-CM | POA: Diagnosis not present

## 2020-08-07 LAB — CMP (CANCER CENTER ONLY)
ALT: 10 U/L (ref 0–44)
AST: 9 U/L — ABNORMAL LOW (ref 15–41)
Albumin: 3.8 g/dL (ref 3.5–5.0)
Alkaline Phosphatase: 82 U/L (ref 38–126)
Anion gap: 10 (ref 5–15)
BUN: 19 mg/dL (ref 8–23)
CO2: 24 mmol/L (ref 22–32)
Calcium: 9.3 mg/dL (ref 8.9–10.3)
Chloride: 104 mmol/L (ref 98–111)
Creatinine: 1.1 mg/dL (ref 0.61–1.24)
GFR, Estimated: >60 mL/min (ref >60)
Glucose, Bld: 123 mg/dL — ABNORMAL HIGH (ref 70–99)
Potassium: 3.9 mmol/L (ref 3.5–5.1)
Sodium: 138 mmol/L (ref 135–145)
Total Bilirubin: 0.6 mg/dL (ref 0.3–1.2)
Total Protein: 8.3 g/dL — ABNORMAL HIGH (ref 6.5–8.1)

## 2020-08-07 LAB — CBC WITH DIFFERENTIAL (CANCER CENTER ONLY)
Abs Immature Granulocytes: 0.02 10*3/uL (ref 0.00–0.07)
Basophils Absolute: 0 10*3/uL (ref 0.0–0.1)
Basophils Relative: 0 %
Eosinophils Absolute: 0.5 10*3/uL (ref 0.0–0.5)
Eosinophils Relative: 9 %
HCT: 35.2 % — ABNORMAL LOW (ref 39.0–52.0)
Hemoglobin: 11.5 g/dL — ABNORMAL LOW (ref 13.0–17.0)
Immature Granulocytes: 0 %
Lymphocytes Relative: 17 %
Lymphs Abs: 0.9 10*3/uL (ref 0.7–4.0)
MCH: 29.1 pg (ref 26.0–34.0)
MCHC: 32.7 g/dL (ref 30.0–36.0)
MCV: 89.1 fL (ref 80.0–100.0)
Monocytes Absolute: 0.3 10*3/uL (ref 0.1–1.0)
Monocytes Relative: 6 %
Neutro Abs: 3.5 10*3/uL (ref 1.7–7.7)
Neutrophils Relative %: 68 %
Platelet Count: 229 10*3/uL (ref 150–400)
RBC: 3.95 MIL/uL — ABNORMAL LOW (ref 4.22–5.81)
RDW: 12.9 % (ref 11.5–15.5)
WBC Count: 5.3 10*3/uL (ref 4.0–10.5)
nRBC: 0 % (ref 0.0–0.2)

## 2020-08-08 DIAGNOSIS — Z7984 Long term (current) use of oral hypoglycemic drugs: Secondary | ICD-10-CM | POA: Diagnosis not present

## 2020-08-08 DIAGNOSIS — E7800 Pure hypercholesterolemia, unspecified: Secondary | ICD-10-CM | POA: Diagnosis not present

## 2020-08-08 DIAGNOSIS — I10 Essential (primary) hypertension: Secondary | ICD-10-CM | POA: Diagnosis not present

## 2020-08-08 DIAGNOSIS — D649 Anemia, unspecified: Secondary | ICD-10-CM | POA: Diagnosis not present

## 2020-08-08 DIAGNOSIS — E1169 Type 2 diabetes mellitus with other specified complication: Secondary | ICD-10-CM | POA: Diagnosis not present

## 2020-08-08 LAB — KAPPA/LAMBDA LIGHT CHAINS
Kappa free light chain: 28.7 mg/L — ABNORMAL HIGH (ref 3.3–19.4)
Kappa, lambda light chain ratio: 0.61 (ref 0.26–1.65)
Lambda free light chains: 47.3 mg/L — ABNORMAL HIGH (ref 5.7–26.3)

## 2020-08-09 LAB — MULTIPLE MYELOMA PANEL, SERUM
Albumin SerPl Elph-Mcnc: 3.8 g/dL (ref 2.9–4.4)
Albumin/Glob SerPl: 1 (ref 0.7–1.7)
Alpha 1: 0.2 g/dL (ref 0.0–0.4)
Alpha2 Glob SerPl Elph-Mcnc: 0.9 g/dL (ref 0.4–1.0)
B-Globulin SerPl Elph-Mcnc: 0.9 g/dL (ref 0.7–1.3)
Gamma Glob SerPl Elph-Mcnc: 2.1 g/dL — ABNORMAL HIGH (ref 0.4–1.8)
Globulin, Total: 4.1 g/dL — ABNORMAL HIGH (ref 2.2–3.9)
IgA: 94 mg/dL (ref 61–437)
IgG (Immunoglobin G), Serum: 2408 mg/dL — ABNORMAL HIGH (ref 603–1613)
IgM (Immunoglobulin M), Srm: 22 mg/dL (ref 20–172)
M Protein SerPl Elph-Mcnc: 1.3 g/dL — ABNORMAL HIGH
Total Protein ELP: 7.9 g/dL (ref 6.0–8.5)

## 2020-08-13 ENCOUNTER — Telehealth: Payer: Self-pay | Admitting: Hematology and Oncology

## 2020-08-13 NOTE — Assessment & Plan Note (Signed)
08/23/2019: Hemoglobin 12.5, MCV 87.2,serum creatinine 1.02 12/02/19 showed: Hg 10.9, HCT 31.9, MCV 87.9, platelets 278, ferritin 563, iron 61, creatinine 0.97 01/24/2020: Hemoglobin 10.5, MCV 88.4, TSH 1.36, LDH 129, M protein 1.2 g IgG lambda, IgG 2298, B12 232, haptoglobin 260, erythropoietin 13.9  Bone marrow biopsy 02/01/2020: Limited marrow aspirate at 2% plasma cells and core biopsy about 5% plasma cells.  Light chain in situ hybridization shows polytypic population.  Even though the bone marrow biopsy is not adequate I do not see any evidence of multiple myeloma.  Treatment plan: Monitor serum protein electrophoresis every 6 months for a year and then annually thereafter 08/07/20: TP: 8.3, Kappa: 28.7, Lambda: 47.3, Ratio 0.61, IgG 2408, M-Protein: 1.3 (was 1.2)  IgG Lambda  Normocytic anemia: With low erythropoietin levels.  As long as the hemoglobin stays above 10 we do not need to treat with erythropoietin stimulating agents.  Return to clinic in 6 months with labs done 1 week ahead of time

## 2020-08-13 NOTE — Telephone Encounter (Signed)
Changes appt to telephone visit, confirmed appt change with pt

## 2020-08-13 NOTE — Progress Notes (Signed)
  HEMATOLOGY-ONCOLOGY TELEPHONE VISIT PROGRESS NOTE  I connected with Alec Hunt on 08/14/2020 at  8:15 AM EST by telephone and verified that I am speaking with the correct person using two identifiers.  I discussed the limitations, risks, security and privacy concerns of performing an evaluation and management service by telephone and the availability of in person appointments.  I also discussed with the patient that there may be a patient responsible charge related to this service. The patient expressed understanding and agreed to proceed.   History of Present Illness: Alec Hunt is a 70 y.o. male with above-mentioned history of anemia and MGUS. Labs on 08/07/20 showed: Hg 11.5, HCT 35.2, platelets 229, m-protein 1.3, kappa lambda light chain ratio 0.62. He presents over MyChart today for follow-up.   Observations/Objective:  He reports that his Hb A1C is down to 6.1 by diet and exercise   Assessment Plan:  MGUS (monoclonal gammopathy of unknown significance) 08/23/2019: Hemoglobin 12.5, MCV 87.2,serum creatinine 1.02 12/02/19 showed: Hg 10.9, HCT 31.9, MCV 87.9, platelets 278, ferritin 563, iron 61, creatinine 0.97 01/24/2020: Hemoglobin 10.5, MCV 88.4, TSH 1.36, LDH 129, M protein 1.2 g IgG lambda, IgG 2298, B12 232, haptoglobin 260, erythropoietin 13.9  Bone marrow biopsy 02/01/2020: Limited marrow aspirate at 2% plasma cells and core biopsy about 5% plasma cells.  Light chain in situ hybridization shows polytypic population.  Even though the bone marrow biopsy is not adequate I do not see any evidence of multiple myeloma.  Treatment plan: Monitor serum protein electrophoresis every 6 months for a year and then annually thereafter 08/07/20: TP: 8.3, Kappa: 28.7, Lambda: 47.3, Ratio 0.61, IgG 2408, M-Protein: 1.3 (was 1.2)  IgG Lambda  Normocytic anemia: With low erythropoietin levels.  As long as the hemoglobin stays above 10 we do not need to treat with erythropoietin stimulating  agents.  Return to clinic in 6 months with labs done 1 week ahead of time    I discussed the assessment and treatment plan with the patient. The patient was provided an opportunity to ask questions and all were answered. The patient agreed with the plan and demonstrated an understanding of the instructions. The patient was advised to call back or seek an in-person evaluation if the symptoms worsen or if the condition fails to improve as anticipated.   Total time spent: 14 mins including non-face to face time and time spent for planning, charting and coordination of care  Rulon Eisenmenger, MD 08/14/2020    I, Cloyde Reams Dorshimer, am acting as scribe for Nicholas Lose, MD.  I have reviewed the above documentation for accuracy and completeness, and I agree with the above.

## 2020-08-14 ENCOUNTER — Inpatient Hospital Stay (HOSPITAL_BASED_OUTPATIENT_CLINIC_OR_DEPARTMENT_OTHER): Payer: Medicare Other | Admitting: Hematology and Oncology

## 2020-08-14 DIAGNOSIS — D472 Monoclonal gammopathy: Secondary | ICD-10-CM | POA: Diagnosis not present

## 2020-08-16 ENCOUNTER — Other Ambulatory Visit: Payer: Self-pay

## 2020-08-16 ENCOUNTER — Ambulatory Visit: Payer: Medicare Other | Admitting: *Deleted

## 2020-08-16 DIAGNOSIS — I4819 Other persistent atrial fibrillation: Secondary | ICD-10-CM

## 2020-08-16 DIAGNOSIS — I4891 Unspecified atrial fibrillation: Secondary | ICD-10-CM | POA: Diagnosis not present

## 2020-08-16 DIAGNOSIS — Z5181 Encounter for therapeutic drug level monitoring: Secondary | ICD-10-CM

## 2020-08-16 LAB — POCT INR: INR: 2.7 (ref 2.0–3.0)

## 2020-08-16 NOTE — Patient Instructions (Signed)
Description   Continue taking 2 tablets daily. Recheck INR in 8 weeks. Call us with any medication changes or concerns # [phone] [facility].

## 2020-09-05 ENCOUNTER — Other Ambulatory Visit: Payer: Self-pay | Admitting: Physician Assistant

## 2020-09-24 DIAGNOSIS — E7800 Pure hypercholesterolemia, unspecified: Secondary | ICD-10-CM | POA: Diagnosis not present

## 2020-09-24 DIAGNOSIS — I4891 Unspecified atrial fibrillation: Secondary | ICD-10-CM | POA: Diagnosis not present

## 2020-09-24 DIAGNOSIS — E785 Hyperlipidemia, unspecified: Secondary | ICD-10-CM | POA: Diagnosis not present

## 2020-09-24 DIAGNOSIS — I480 Paroxysmal atrial fibrillation: Secondary | ICD-10-CM | POA: Diagnosis not present

## 2020-09-24 DIAGNOSIS — I10 Essential (primary) hypertension: Secondary | ICD-10-CM | POA: Diagnosis not present

## 2020-09-24 DIAGNOSIS — D649 Anemia, unspecified: Secondary | ICD-10-CM | POA: Diagnosis not present

## 2020-09-24 DIAGNOSIS — E1169 Type 2 diabetes mellitus with other specified complication: Secondary | ICD-10-CM | POA: Diagnosis not present

## 2020-09-24 DIAGNOSIS — E1165 Type 2 diabetes mellitus with hyperglycemia: Secondary | ICD-10-CM | POA: Diagnosis not present

## 2020-09-24 DIAGNOSIS — D638 Anemia in other chronic diseases classified elsewhere: Secondary | ICD-10-CM | POA: Diagnosis not present

## 2020-09-28 ENCOUNTER — Other Ambulatory Visit: Payer: Self-pay | Admitting: Cardiology

## 2020-10-01 DIAGNOSIS — G4731 Primary central sleep apnea: Secondary | ICD-10-CM | POA: Diagnosis not present

## 2020-10-01 DIAGNOSIS — G4733 Obstructive sleep apnea (adult) (pediatric): Secondary | ICD-10-CM | POA: Diagnosis not present

## 2020-10-11 ENCOUNTER — Ambulatory Visit (INDEPENDENT_AMBULATORY_CARE_PROVIDER_SITE_OTHER): Payer: Medicare Other | Admitting: Pharmacist

## 2020-10-11 ENCOUNTER — Other Ambulatory Visit: Payer: Self-pay

## 2020-10-11 DIAGNOSIS — I4819 Other persistent atrial fibrillation: Secondary | ICD-10-CM

## 2020-10-11 DIAGNOSIS — Z5181 Encounter for therapeutic drug level monitoring: Secondary | ICD-10-CM

## 2020-10-11 DIAGNOSIS — I4891 Unspecified atrial fibrillation: Secondary | ICD-10-CM

## 2020-10-11 LAB — POCT INR: INR: 2.7 (ref 2.0–3.0)

## 2020-10-11 NOTE — Patient Instructions (Signed)
Continue taking 2 tablets daily. Recheck INR in 8 weeks. Call us with any medication changes or concerns # [phone] [facility].

## 2020-10-19 DIAGNOSIS — I480 Paroxysmal atrial fibrillation: Secondary | ICD-10-CM | POA: Diagnosis not present

## 2020-10-19 DIAGNOSIS — I10 Essential (primary) hypertension: Secondary | ICD-10-CM | POA: Diagnosis not present

## 2020-10-19 DIAGNOSIS — E1169 Type 2 diabetes mellitus with other specified complication: Secondary | ICD-10-CM | POA: Diagnosis not present

## 2020-10-19 DIAGNOSIS — E1165 Type 2 diabetes mellitus with hyperglycemia: Secondary | ICD-10-CM | POA: Diagnosis not present

## 2020-10-19 DIAGNOSIS — D638 Anemia in other chronic diseases classified elsewhere: Secondary | ICD-10-CM | POA: Diagnosis not present

## 2020-10-19 DIAGNOSIS — D649 Anemia, unspecified: Secondary | ICD-10-CM | POA: Diagnosis not present

## 2020-10-19 DIAGNOSIS — E785 Hyperlipidemia, unspecified: Secondary | ICD-10-CM | POA: Diagnosis not present

## 2020-10-19 DIAGNOSIS — I4891 Unspecified atrial fibrillation: Secondary | ICD-10-CM | POA: Diagnosis not present

## 2020-10-19 DIAGNOSIS — E7800 Pure hypercholesterolemia, unspecified: Secondary | ICD-10-CM | POA: Diagnosis not present

## 2020-11-02 ENCOUNTER — Other Ambulatory Visit: Payer: Self-pay | Admitting: Cardiology

## 2020-11-10 ENCOUNTER — Other Ambulatory Visit: Payer: Self-pay | Admitting: Cardiology

## 2020-11-21 ENCOUNTER — Other Ambulatory Visit: Payer: Self-pay | Admitting: Cardiology

## 2020-12-06 ENCOUNTER — Ambulatory Visit: Payer: Medicare Other | Admitting: *Deleted

## 2020-12-06 ENCOUNTER — Other Ambulatory Visit: Payer: Self-pay

## 2020-12-06 DIAGNOSIS — I4891 Unspecified atrial fibrillation: Secondary | ICD-10-CM | POA: Diagnosis not present

## 2020-12-06 DIAGNOSIS — Z5181 Encounter for therapeutic drug level monitoring: Secondary | ICD-10-CM

## 2020-12-06 LAB — POCT INR: INR: 3.1 — AB (ref 2.0–3.0)

## 2020-12-06 NOTE — Patient Instructions (Signed)
Description    Take 1 tablet today and then Continue taking 2 tablets daily. Recheck INR in 6 weeks. Call us with any medication changes or concerns # [phone] [facility].

## 2020-12-07 ENCOUNTER — Other Ambulatory Visit: Payer: Self-pay | Admitting: Cardiology

## 2020-12-13 ENCOUNTER — Encounter: Payer: Self-pay | Admitting: Cardiology

## 2020-12-13 ENCOUNTER — Ambulatory Visit: Payer: Medicare Other | Admitting: Cardiology

## 2020-12-13 ENCOUNTER — Other Ambulatory Visit: Payer: Self-pay

## 2020-12-13 VITALS — BP 130/72 | HR 55 | Ht 73.0 in | Wt 328.0 lb

## 2020-12-13 DIAGNOSIS — G4733 Obstructive sleep apnea (adult) (pediatric): Secondary | ICD-10-CM | POA: Diagnosis not present

## 2020-12-13 DIAGNOSIS — I10 Essential (primary) hypertension: Secondary | ICD-10-CM

## 2020-12-13 DIAGNOSIS — I480 Paroxysmal atrial fibrillation: Secondary | ICD-10-CM

## 2020-12-13 DIAGNOSIS — I441 Atrioventricular block, second degree: Secondary | ICD-10-CM | POA: Diagnosis not present

## 2020-12-13 MED ORDER — LOSARTAN POTASSIUM 100 MG PO TABS: ORAL_TABLET | ORAL | 3 refills | Status: DC

## 2020-12-13 NOTE — Progress Notes (Signed)
Date:  12/13/2020   ID:  Alec Hunt, DOB 1950/12/16, MRN [id number]   PCP:  Vernie Shanks, MD  Cardiologist:  Fransico Him, MD  Electrophysiologist:  None   Chief Complaint:  OSA, PAF  History of Present Illness:    Alec Hunt is a 70 y.o. male with a hx of persistent atrial fibrillation, OSA on BiPAP, HTN, second degree type I Mobitz heart block, RBBB, history of syncope in setting of afib and morbid obesity.  His last echo showed normal LVF with EF 60-65% with G2DD and mildly dilated ascending aorta.   He is here today for followup and is doing well.  He denies any chest pain or pressure, SOB, DOE, PND, orthopnea, LE edema, dizziness, palpitations or syncope. He is compliant with his meds and is tolerating meds with no SE.     He is doing well with his PAP device and thinks that he has gotten used to it.  He tolerates the mask and feels the pressure is adequate.  Since going on PAP he feels rested in the am and has no significant daytime sleepiness.  He denies any significant mouth or nasal dryness or nasal congestion.  He does not think that he snores.     Prior CV studies:   The following studies were reviewed today:  PAP download, EKG  Past Medical History:  Diagnosis Date   Arthritis    kness & shoulders   Bradycardia    History of syncope 10/2013   in setting new dx atrial fib   Hypertension    Mobitz type 1 second degree atrioventricular block    Morbid obesity ([place])    Nocturia    OSA treated with BiPAP    Severe w AHI 81.28/hr now on Bipap   Persistent atrial fibrillation [facility])    cardiologist-  dr Tressia Miners Avelyn Touch-- dx 2014   Prostate cancer [facility]) urologist-  dr Alyson Ingles  oncologist-  dr Tammi Klippel   dx 06-11-2017--- Stage T1c, Gleason 4+5, PSA 7.65, vol 32.8cc--  scheduled for radiactive seed implants 11-12-2017   RBBB    Wears glasses    Past Surgical History:  Procedure Laterality Date   CARDIAC CATHETERIZATION  1990s  dr t. Radford Pax   per pt told  normal coronaries   CARDIOVASCULAR STRESS TEST  06/16/2012   Low risk nuclear study/  normal LV function and wall motion , ef 61%   CARDIOVASCULAR STRESS TEST  06/16/2012   Low risk nuclear study w/ no ischemia/ normal LV function and wall motion , ef 61%   COLONOSCOPY  2012 approx.   CYSTOSCOPY  11/12/2017   Procedure: CYSTOSCOPY FLEXIBLE;  Surgeon: Cleon Gustin, MD;  Location: [facility];  Service: Urology;;   KNEE ARTHROSCOPY Right 1990s   LUMBAR LAMINECTOMY/DECOMPRESSION MICRODISCECTOMY N/A 09/27/2014   Procedure: LUMBAR LAMINECTOMY/DECOMPRESSION MICRODISCECTOMY 3 LEVELS L4-5 L3-4 POSSIBLE L2-3 BILATERAL FORAMINOTOMIES AT L-3,4 AND 5;  Surgeon: Susa Day, MD;  Location: [facility];  Service: Orthopedics;  Laterality: N/A;   PROSTATE BIOPSY  06-11-2017   dr Alyson Ingles office   RADIOACTIVE SEED IMPLANT N/A 11/12/2017   Procedure: RADIOACTIVE SEED IMPLANT/BRACHYTHERAPY IMPLANT;  Surgeon: Cleon Gustin, MD;  Location: [facility];  Service: Urology;  Laterality: N/A;   ROTATOR CUFF REPAIR Right 2015   SPACE OAR INSTILLATION N/A 11/12/2017   Procedure: SPACE OAR INSTILLATION;  Surgeon: Cleon Gustin, MD;  Location: [facility];  Service: Urology;  Laterality: N/A;   TRANSTHORACIC ECHOCARDIOGRAM  05/26/2017   mild  LVH, ef 75-17%, grade 2 diastolic dysfunction/  mild dilaged ascending aorta/  trivial PR and TR/  mild RAE   TRANSTHORACIC ECHOCARDIOGRAM     mild LVH, ef 60-65%, grade 2 diastoli dysfunction/ mild dilated ascending aorta/  trivil PR and TR/  mild RAE     Current Meds  Medication Sig   acetaminophen (TYLENOL) 500 MG tablet Take 1,000 mg by mouth 2 (two) times daily as needed for moderate pain.   alfuzosin (UROXATRAL) 10 MG 24 hr tablet Take 10 mg by mouth at bedtime.   losartan (COZAAR) 100 MG tablet TAKE 1 TABLET BY MOUTH EVERY DAY IN THE MORNING. Please keep  overdue appt with Dr. Radford Pax in June  before anymore refills. Thank you 3rd and final attempt   metFORMIN (GLUCOPHAGE)  500 MG tablet Take 1,000 mg by mouth 2 (two) times daily.    warfarin (COUMADIN) 5 MG tablet TAKE 1.5 TO 2 TABLETS DAILY BY MOUTH AS DIRECTED BY COUMADIN CLINIC     Allergies:   Hydrochlorothiazide   Social History   Tobacco Use   Smoking status: Former    Packs/day: 0.25    Years: 15.00    Pack years: 3.75    Types: Cigarettes    Quit date: 02/03/2000    Years since quitting: 20.8   Smokeless tobacco: Never  Vaping Use   Vaping Use: Never used  Substance Use Topics   Alcohol use: Not Currently    Comment: hx of ETOH use quit at age 29 (83)   Drug use: No     Family Hx: The patient's family history includes CAD in his mother; Heart attack in his mother; Heart disease in his mother; Hypertension in his brother. There is no history of Cancer.  ROS:   Please see the history of present illness.     All other systems reviewed and are negative.   Labs/Other Tests and Data Reviewed:    Recent Labs: 01/24/2020: TSH 1.365 08/07/2020: ALT 10; BUN 19; Creatinine 1.10; Hemoglobin 11.5; Platelet Count 229; Potassium 3.9; Sodium 138   Recent Lipid Panel No results found for: CHOL, TRIG, HDL, CHOLHDL, LDLCALC, LDLDIRECT  Wt Readings from Last 3 Encounters:  12/13/20 (!) 328 lb (148.8 kg)  02/09/20 (!) 320 lb 8 oz (145.4 kg)  02/01/20 (!) 314 lb 12 oz (142.8 kg)     Objective:    Vital Signs:  BP 130/72   Pulse (!) 55   Ht 6\' 1"  (1.854 m)   Wt (!) 328 lb (148.8 kg)   SpO2 99%   BMI 43.27 kg/m   GEN: Well nourished, well developed in no acute distress HEENT: Normal NECK: No JVD; No carotid bruits LYMPHATICS: No lymphadenopathy CARDIAC:RRR, no murmurs, rubs, gallops RESPIRATORY:  Clear to auscultation without rales, wheezing or rhonchi  ABDOMEN: Soft, non-tender, non-distended MUSCULOSKELETAL:  No edema; No deformity  SKIN: Warm and dry NEUROLOGIC:  Alert and oriented x 3 PSYCHIATRIC:  Normal affect    EKG was performed in the office today and demonstrated sinus  bradycardia with RBBB  ASSESSMENT & PLAN:    1.  OSA - The patient is tolerating PAP therapy well without any problems. The PAP download performed by his DME was personally reviewed and interpreted by me today and showed an AHI of 4.6/hr on 21/15 cm H2O with 97% compliance in using more than 4 hours nightly.  The patient has been using and benefiting from PAP use and will continue to benefit from therapy.  2.  PAF -he is maintaining NSR on exam and denies any palpitations since I last saw him -he has not had any bleeding issues on warfarin -Continue prescription drug management and INR followups with Coumadin>>INR monitored in our coumadin clinic -I have personally reviewed and interpreted outside labs performed by patient's PCP which showed Hbg 11.5  -INR 3.1 a week ago  3.  HTN -BP is well controlled on exam today -Continue prescription drug management with Losartan 100mg  daily -I have personally reviewed and interpreted outside labs performed by patient's PCP which showed SCr 1.1, K+ 3.9 in Feb 2022  4.  Mobitz I second degree AV block -asymptomatic with no dizziness or syncope  5. Morbid Obesity -he has gained some weight -I have encouraged him to continue to watch his carbs and portions -I have encouraged him to try to get back into an exercise program  Medication Adjustments/Labs and Tests Ordered: Current medicines are reviewed at length with the patient today.  Concerns regarding medicines are outlined above.  Tests Ordered: Orders Placed This Encounter  Procedures   EKG 12-Lead    Medication Changes: No orders of the defined types were placed in this encounter.   Disposition:  Follow up in 1 year(s)  Signed, Fransico Him, MD  12/13/2020 9:23 AM    [facility]

## 2020-12-13 NOTE — Patient Instructions (Signed)
Medication Instructions:  Your physician recommends that you continue on your current medications as directed. Please refer to the Current Medication list given to you today.  *If you need a refill on your cardiac medications before your next appointment, please call your pharmacy*  Follow-Up: At [facility], you and your health needs are our priority.  As part of our continuing mission to provide you with exceptional heart care, we have created designated Provider Care Teams.  These Care Teams include your primary Cardiologist (physician) and Advanced Practice Providers (APPs -  Physician Assistants and Nurse Practitioners) who all work together to provide you with the care you need, when you need it.  We recommend signing up for the patient portal called "MyChart".  Sign up information is provided on this After Visit Summary.  MyChart is used to connect with patients for Virtual Visits (Telemedicine).  Patients are able to view lab/test results, encounter notes, upcoming appointments, etc.  Non-urgent messages can be sent to your provider as well.   To learn more about what you can do with MyChart, go to [email or web address].    Your next appointment:   1 year(s)  The format for your next appointment:   In Person  Provider:   Traci Turner, MD   

## 2020-12-13 NOTE — Addendum Note (Signed)
Addended by: Patterson Hammersmith A on: 12/13/2020 09:28 AM   Modules accepted: Orders

## 2020-12-20 DIAGNOSIS — E1169 Type 2 diabetes mellitus with other specified complication: Secondary | ICD-10-CM | POA: Diagnosis not present

## 2020-12-20 DIAGNOSIS — E7800 Pure hypercholesterolemia, unspecified: Secondary | ICD-10-CM | POA: Diagnosis not present

## 2020-12-20 DIAGNOSIS — I480 Paroxysmal atrial fibrillation: Secondary | ICD-10-CM | POA: Diagnosis not present

## 2020-12-20 DIAGNOSIS — I4891 Unspecified atrial fibrillation: Secondary | ICD-10-CM | POA: Diagnosis not present

## 2020-12-20 DIAGNOSIS — E785 Hyperlipidemia, unspecified: Secondary | ICD-10-CM | POA: Diagnosis not present

## 2020-12-20 DIAGNOSIS — D638 Anemia in other chronic diseases classified elsewhere: Secondary | ICD-10-CM | POA: Diagnosis not present

## 2020-12-20 DIAGNOSIS — D649 Anemia, unspecified: Secondary | ICD-10-CM | POA: Diagnosis not present

## 2020-12-20 DIAGNOSIS — E1165 Type 2 diabetes mellitus with hyperglycemia: Secondary | ICD-10-CM | POA: Diagnosis not present

## 2020-12-20 DIAGNOSIS — I10 Essential (primary) hypertension: Secondary | ICD-10-CM | POA: Diagnosis not present

## 2020-12-26 ENCOUNTER — Other Ambulatory Visit: Payer: Self-pay | Admitting: Urology

## 2021-01-03 DIAGNOSIS — G4731 Primary central sleep apnea: Secondary | ICD-10-CM | POA: Diagnosis not present

## 2021-01-03 DIAGNOSIS — G4733 Obstructive sleep apnea (adult) (pediatric): Secondary | ICD-10-CM | POA: Diagnosis not present

## 2021-01-16 ENCOUNTER — Telehealth: Payer: Self-pay | Admitting: *Deleted

## 2021-01-16 NOTE — Telephone Encounter (Signed)
   [facility] Pre-operative Risk Assessment    Patient Name: Alec Hunt  DOB: [birth year] MRN: [id number]  HEARTCARE STAFF:  - IMPORTANT!!!!!! Under Visit Info/Reason for Call, type in Other and utilize the format Clearance MM/DD/YY or Clearance TBD. Do not use dashes or single digits. - Please review there is not already an duplicate clearance open for this procedure. - If request is for dental extraction, please clarify the # of teeth to be extracted. - If the patient is currently at the dentist's office, call Pre-Op Callback Staff (MA/nurse) to input urgent request.  - If the patient is not currently in the dentist office, please route to the Pre-Op pool.  Request for surgical clearance:  What type of surgery is being performed? COLONOSCOPY  When is this surgery scheduled? 04/24/21  What type of clearance is required (medical clearance vs. Pharmacy clearance to hold med vs. Both)? BOTH  Are there any medications that need to be held prior to surgery and how long?  WARFARIN  Practice name and name of physician performing surgery? [facility]; DR. Alessandra Bevels  What is the office phone number? [phone]   7.   What is the office fax number? [phone]  8.   Anesthesia type (None, local, MAC, general) ? PROPOFOL   Alec Hunt 01/16/2021, 5:16 PM  _________________________________________________________________   (provider comments below)

## 2021-01-17 ENCOUNTER — Ambulatory Visit (INDEPENDENT_AMBULATORY_CARE_PROVIDER_SITE_OTHER): Payer: Medicare Other | Admitting: *Deleted

## 2021-01-17 ENCOUNTER — Other Ambulatory Visit: Payer: Self-pay

## 2021-01-17 DIAGNOSIS — I4891 Unspecified atrial fibrillation: Secondary | ICD-10-CM | POA: Diagnosis not present

## 2021-01-17 DIAGNOSIS — Z5181 Encounter for therapeutic drug level monitoring: Secondary | ICD-10-CM

## 2021-01-17 DIAGNOSIS — I4819 Other persistent atrial fibrillation: Secondary | ICD-10-CM | POA: Diagnosis not present

## 2021-01-17 LAB — POCT INR: INR: 3.2 — AB (ref 2.0–3.0)

## 2021-01-17 NOTE — Patient Instructions (Signed)
Description   Take 1 tablet today and then start taking 2 tablets daily except 1.5 tablets on Mondays. Recheck INR in 4 weeks. Call us with any medication changes or concerns # [phone] [facility].

## 2021-01-21 NOTE — Telephone Encounter (Signed)
Patient with diagnosis of atrial fibrillation on warfarin for anticoagulation.    Procedure: colonoscopy Date of procedure: 04/24/21   CHA2DS2-VASc Score = 2  This indicates a 2.2% annual risk of stroke. The patient's score is based upon: CHF History: No HTN History: Yes Diabetes History: No Stroke History: No Vascular Disease History: No Age Score: 1 Gender Score: 0    CrCl 95 (using adjusted body weight) Platelet count 229  Per office protocol, patient can hold warfarin for 5 days prior to procedure.   Patient will not need bridging with Lovenox (enoxaparin) around procedure.  If not bridging, patient should restart warfarin on the evening of procedure or day after, at discretion of procedure MD

## 2021-01-22 NOTE — Telephone Encounter (Signed)
   Primary Cardiologist: Fransico Him, MD  Chart reviewed as part of pre-operative protocol coverage. Given past medical history and time since last visit, based on ACC/AHA guidelines, Alec Hunt would be at acceptable risk for the planned procedure without further cardiovascular testing.   Patient with diagnosis of atrial fibrillation on warfarin for anticoagulation.     Procedure: colonoscopy Date of procedure: 04/24/21     CHA2DS2-VASc Score = 2  This indicates a 2.2% annual risk of stroke. The patient's score is based upon: CHF History: No HTN History: Yes Diabetes History: No Stroke History: No Vascular Disease History: No Age Score: 1 Gender Score: 0      CrCl 95 (using adjusted body weight) Platelet count 229   Per office protocol, patient can hold warfarin for 5 days prior to procedure.   Patient will not need bridging with Lovenox (enoxaparin) around procedure.   If not bridging, patient should restart warfarin on the evening of procedure or day after, at discretion of procedure MD  I will route this recommendation to the requesting party via [place] fax function and remove from pre-op pool.  Please call with questions.  Jossie Ng. Mindy Gali NP-C    01/22/2021, 7:00 AM [address] Office [phone] Fax [phone]

## 2021-01-31 ENCOUNTER — Other Ambulatory Visit: Payer: Self-pay | Admitting: Cardiology

## 2021-02-11 ENCOUNTER — Inpatient Hospital Stay: Payer: Medicare Other | Attending: Family Medicine

## 2021-02-11 DIAGNOSIS — D472 Monoclonal gammopathy: Secondary | ICD-10-CM | POA: Insufficient documentation

## 2021-02-14 ENCOUNTER — Ambulatory Visit: Payer: Medicare Other | Admitting: *Deleted

## 2021-02-14 ENCOUNTER — Other Ambulatory Visit: Payer: Self-pay

## 2021-02-14 DIAGNOSIS — Z5181 Encounter for therapeutic drug level monitoring: Secondary | ICD-10-CM | POA: Diagnosis not present

## 2021-02-14 DIAGNOSIS — I4891 Unspecified atrial fibrillation: Secondary | ICD-10-CM

## 2021-02-14 LAB — POCT INR: INR: 2.4 (ref 2.0–3.0)

## 2021-02-14 NOTE — Patient Instructions (Signed)
Description   Continue taking 2 tablets daily except 1.5 tablets on Mondays. Recheck INR in 5 weeks. Call us with any medication changes or concerns # [phone] [facility].

## 2021-02-18 ENCOUNTER — Other Ambulatory Visit: Payer: Self-pay

## 2021-02-18 ENCOUNTER — Inpatient Hospital Stay: Payer: Medicare Other

## 2021-02-18 ENCOUNTER — Inpatient Hospital Stay: Payer: Medicare Other | Admitting: Hematology and Oncology

## 2021-02-18 DIAGNOSIS — D472 Monoclonal gammopathy: Secondary | ICD-10-CM

## 2021-02-18 LAB — CBC WITH DIFFERENTIAL (CANCER CENTER ONLY)
Abs Immature Granulocytes: 0.01 10*3/uL (ref 0.00–0.07)
Basophils Absolute: 0 10*3/uL (ref 0.0–0.1)
Basophils Relative: 0 %
Eosinophils Absolute: 0.4 10*3/uL (ref 0.0–0.5)
Eosinophils Relative: 8 %
HCT: 35.4 % — ABNORMAL LOW (ref 39.0–52.0)
Hemoglobin: 11.6 g/dL — ABNORMAL LOW (ref 13.0–17.0)
Immature Granulocytes: 0 %
Lymphocytes Relative: 19 %
Lymphs Abs: 0.9 10*3/uL (ref 0.7–4.0)
MCH: 29.3 pg (ref 26.0–34.0)
MCHC: 32.8 g/dL (ref 30.0–36.0)
MCV: 89.4 fL (ref 80.0–100.0)
Monocytes Absolute: 0.4 10*3/uL (ref 0.1–1.0)
Monocytes Relative: 8 %
Neutro Abs: 3 10*3/uL (ref 1.7–7.7)
Neutrophils Relative %: 65 %
Platelet Count: 208 10*3/uL (ref 150–400)
RBC: 3.96 MIL/uL — ABNORMAL LOW (ref 4.22–5.81)
RDW: 13 % (ref 11.5–15.5)
WBC Count: 4.7 10*3/uL (ref 4.0–10.5)
nRBC: 0 % (ref 0.0–0.2)

## 2021-02-18 LAB — CMP (CANCER CENTER ONLY)
ALT: 16 U/L (ref 0–44)
AST: 11 U/L — ABNORMAL LOW (ref 15–41)
Albumin: 3.8 g/dL (ref 3.5–5.0)
Alkaline Phosphatase: 81 U/L (ref 38–126)
Anion gap: 9 (ref 5–15)
BUN: 15 mg/dL (ref 8–23)
CO2: 23 mmol/L (ref 22–32)
Calcium: 9.7 mg/dL (ref 8.9–10.3)
Chloride: 105 mmol/L (ref 98–111)
Creatinine: 1.2 mg/dL (ref 0.61–1.24)
GFR, Estimated: >60 mL/min (ref >60)
Glucose, Bld: 115 mg/dL — ABNORMAL HIGH (ref 70–99)
Potassium: 4.2 mmol/L (ref 3.5–5.1)
Sodium: 137 mmol/L (ref 135–145)
Total Bilirubin: 0.7 mg/dL (ref 0.3–1.2)
Total Protein: 8 g/dL (ref 6.5–8.1)

## 2021-02-19 LAB — KAPPA/LAMBDA LIGHT CHAINS
Kappa free light chain: 29.2 mg/L — ABNORMAL HIGH (ref 3.3–19.4)
Kappa, lambda light chain ratio: 0.71 (ref 0.26–1.65)
Lambda free light chains: 41.3 mg/L — ABNORMAL HIGH (ref 5.7–26.3)

## 2021-02-19 LAB — BETA 2 MICROGLOBULIN, SERUM: Beta-2 Microglobulin: 2 mg/L (ref 0.6–2.4)

## 2021-02-20 LAB — MULTIPLE MYELOMA PANEL, SERUM
Albumin SerPl Elph-Mcnc: 3.5 g/dL (ref 2.9–4.4)
Albumin/Glob SerPl: 1 (ref 0.7–1.7)
Alpha 1: 0.2 g/dL (ref 0.0–0.4)
Alpha2 Glob SerPl Elph-Mcnc: 0.9 g/dL (ref 0.4–1.0)
B-Globulin SerPl Elph-Mcnc: 0.9 g/dL (ref 0.7–1.3)
Gamma Glob SerPl Elph-Mcnc: 1.8 g/dL (ref 0.4–1.8)
Globulin, Total: 3.8 g/dL (ref 2.2–3.9)
IgA: 86 mg/dL (ref 61–437)
IgG (Immunoglobin G), Serum: 2150 mg/dL — ABNORMAL HIGH (ref 603–1613)
IgM (Immunoglobulin M), Srm: 30 mg/dL (ref 15–143)
M Protein SerPl Elph-Mcnc: 0.9 g/dL — ABNORMAL HIGH
Total Protein ELP: 7.3 g/dL (ref 6.0–8.5)

## 2021-02-23 NOTE — Progress Notes (Signed)
  HEMATOLOGY-ONCOLOGY TELEPHONE VISIT PROGRESS NOTE  I connected with Alec Hunt on 02/25/2021 at 10:15 AM EDT by telephone and verified that I am speaking with the correct person using two identifiers.  I discussed the limitations, risks, security and privacy concerns of performing an evaluation and management service by telephone and the availability of in person appointments.  I also discussed with the patient that there may be a patient responsible charge related to this service. The patient expressed understanding and agreed to proceed.   History of Present Illness: Alec Hunt is a 70 y.o. male with above-mentioned history of anemia. Labs on 02/18/21 showed RBC 3.96, Hg 11.6, HCT 35.4. He presents via telephone today for follow-up.  Observations/Objective: Denies any pain or discomfort    Assessment Plan:  MGUS (monoclonal gammopathy of unknown significance) History of Present Illness: Alec Hunt is a 70 y.o. male with above-mentioned history of anemia and MGUS. Labs on 08/07/20 showed: Hg 11.5, HCT 35.2, platelets 229, m-protein 1.3, kappa lambda light chain ratio 0.62. He presents over MyChart today for follow-up.    Observations/Objective:  Denies any new problems or concerns   Assessment Plan:  MGUS (monoclonal gammopathy of unknown significance) 08/23/2019: Hemoglobin 12.5, MCV 87.2, serum creatinine 1.02 12/02/19 showed: Hg 10.9, HCT 31.9, MCV 87.9, platelets 278, ferritin 563, iron 61, creatinine 0.97 01/24/2020: Hemoglobin 10.5, MCV 88.4, TSH 1.36, LDH 129, M protein 1.2 g IgG lambda, IgG 2298, B12 232, haptoglobin 260, erythropoietin 13.9   Bone marrow biopsy 02/01/2020: Limited marrow aspirate at 2% plasma cells and core biopsy about 5% plasma cells.  Light chain in situ hybridization shows polytypic population.  Even though the bone marrow biopsy is not adequate I do not see any evidence of multiple myeloma.   Treatment plan: Monitor serum protein  electrophoresis every 6 months for a year and then annually thereafter 08/07/20: TP: 8.3, Kappa: 28.7, Lambda: 47.3, Ratio 0.61, IgG 2408, M-Protein: 1.3 (was 1.2)  IgG Lambda 02/18/2021: Hemoglobin 11.6, M protein 0.9 g IgG lambda (was 1.3 g), R29.2, lambda 41.3, ratio 0.71: Normal, creatinine 1.2, calcium 9.7, beta-2 microglobulin 2  Normocytic anemia: With low erythropoietin levels.  As long as the hemoglobin stays above 10 we do not need to treat with erythropoietin stimulating agents.    Return to clinic in 1 year with labs done 1 week ahead of time    I discussed the assessment and treatment plan with the patient. The patient was provided an opportunity to ask questions and all were answered. The patient agreed with the plan and demonstrated an understanding of the instructions. The patient was advised to call back or seek an in-person evaluation if the symptoms worsen or if the condition fails to improve as anticipated.   Total time spent: 11 mins including non-face to face time and time spent for planning, charting and coordination of care  Rulon Eisenmenger, MD 02/25/2021    I, Thana Ates, am acting as scribe for Nicholas Lose, MD.  I have reviewed the above documentation for accuracy and completeness, and I agree with the above.

## 2021-02-25 ENCOUNTER — Inpatient Hospital Stay (HOSPITAL_BASED_OUTPATIENT_CLINIC_OR_DEPARTMENT_OTHER): Payer: Medicare Other | Admitting: Hematology and Oncology

## 2021-02-25 DIAGNOSIS — D472 Monoclonal gammopathy: Secondary | ICD-10-CM | POA: Diagnosis not present

## 2021-02-25 DIAGNOSIS — I10 Essential (primary) hypertension: Secondary | ICD-10-CM | POA: Diagnosis not present

## 2021-02-25 DIAGNOSIS — E785 Hyperlipidemia, unspecified: Secondary | ICD-10-CM | POA: Diagnosis not present

## 2021-02-25 DIAGNOSIS — E1165 Type 2 diabetes mellitus with hyperglycemia: Secondary | ICD-10-CM | POA: Diagnosis not present

## 2021-02-25 DIAGNOSIS — D649 Anemia, unspecified: Secondary | ICD-10-CM | POA: Diagnosis not present

## 2021-02-25 DIAGNOSIS — E7800 Pure hypercholesterolemia, unspecified: Secondary | ICD-10-CM | POA: Diagnosis not present

## 2021-02-25 DIAGNOSIS — I480 Paroxysmal atrial fibrillation: Secondary | ICD-10-CM | POA: Diagnosis not present

## 2021-02-25 DIAGNOSIS — D638 Anemia in other chronic diseases classified elsewhere: Secondary | ICD-10-CM | POA: Diagnosis not present

## 2021-02-25 DIAGNOSIS — E1169 Type 2 diabetes mellitus with other specified complication: Secondary | ICD-10-CM | POA: Diagnosis not present

## 2021-02-25 DIAGNOSIS — I4891 Unspecified atrial fibrillation: Secondary | ICD-10-CM | POA: Diagnosis not present

## 2021-02-25 NOTE — Assessment & Plan Note (Signed)
History of Present Illness: Alec Hunt is a 70 y.o. male with above-mentioned history of anemiaand MGUS. Labs on 08/07/20 showed: Hg 11.5, HCT 35.2, platelets 229, m-protein 1.3, kappa lambda light chain ratio 0.62. He presents over MyChart todayfor follow-up.   Observations/Objective:  He reports that his Hb A1C is down to 6.1 by diet and exercise   Assessment Plan:  MGUS (monoclonal gammopathy of unknown significance) 08/23/2019: Hemoglobin 12.5, MCV 87.2,serum creatinine 1.02 12/02/19 showed: Hg 10.9, HCT 31.9, MCV 87.9, platelets 278, ferritin 563, iron 61, creatinine 0.97 01/24/2020: Hemoglobin 10.5, MCV 88.4, TSH 1.36, LDH 129, M protein 1.2 g IgG lambda, IgG 2298, B12 232, haptoglobin 260, erythropoietin 13.9  Bone marrow biopsy 02/01/2020: Limited marrow aspirate at 2% plasma cells and core biopsy about 5% plasma cells. Light chain in situ hybridization shows polytypic population. Even though the bone marrow biopsy is not adequate I do not see any evidence of multiple myeloma.  Treatment plan: Monitor serum protein electrophoresis every 6 months for a year and then annually thereafter 08/07/20: TP: 8.3, Kappa: 28.7, Lambda: 47.3, Ratio 0.61, IgG 2408, M-Protein: 1.3 (was 1.2)  IgG Lambda  Normocytic anemia: With low erythropoietin levels. As long as the hemoglobin stays above 10 we do not need to treat with erythropoietin stimulating agents. 02/18/2021: Hemoglobin 11.6, M protein 0.9 g IgG lambda (was 1.3 g), R29.2, lambda 41.3, ratio 0.71: Normal, creatinine 1.2, calcium 9.7, beta-2 microglobulin 2  Return to clinic in 1 year with labs done 1 week ahead of time

## 2021-03-21 ENCOUNTER — Other Ambulatory Visit: Payer: Self-pay

## 2021-03-21 ENCOUNTER — Ambulatory Visit: Payer: Medicare Other

## 2021-03-21 DIAGNOSIS — I4891 Unspecified atrial fibrillation: Secondary | ICD-10-CM

## 2021-03-21 DIAGNOSIS — Z5181 Encounter for therapeutic drug level monitoring: Secondary | ICD-10-CM

## 2021-03-21 LAB — POCT INR: INR: 2.4 (ref 2.0–3.0)

## 2021-03-21 NOTE — Patient Instructions (Signed)
Description   Continue taking 2 tablets daily except 1.5 tablets on Mondays. Recheck INR on 04/16/21. Call us with any medication changes or concerns # [phone] [facility]. Colonoscopy on 04/24/2021

## 2021-03-29 DIAGNOSIS — D638 Anemia in other chronic diseases classified elsewhere: Secondary | ICD-10-CM | POA: Diagnosis not present

## 2021-03-29 DIAGNOSIS — I480 Paroxysmal atrial fibrillation: Secondary | ICD-10-CM | POA: Diagnosis not present

## 2021-03-29 DIAGNOSIS — E1165 Type 2 diabetes mellitus with hyperglycemia: Secondary | ICD-10-CM | POA: Diagnosis not present

## 2021-03-29 DIAGNOSIS — D649 Anemia, unspecified: Secondary | ICD-10-CM | POA: Diagnosis not present

## 2021-03-29 DIAGNOSIS — E1169 Type 2 diabetes mellitus with other specified complication: Secondary | ICD-10-CM | POA: Diagnosis not present

## 2021-03-29 DIAGNOSIS — I10 Essential (primary) hypertension: Secondary | ICD-10-CM | POA: Diagnosis not present

## 2021-03-29 DIAGNOSIS — E7800 Pure hypercholesterolemia, unspecified: Secondary | ICD-10-CM | POA: Diagnosis not present

## 2021-03-29 DIAGNOSIS — E785 Hyperlipidemia, unspecified: Secondary | ICD-10-CM | POA: Diagnosis not present

## 2021-04-04 DIAGNOSIS — G4733 Obstructive sleep apnea (adult) (pediatric): Secondary | ICD-10-CM | POA: Diagnosis not present

## 2021-04-04 DIAGNOSIS — G4731 Primary central sleep apnea: Secondary | ICD-10-CM | POA: Diagnosis not present

## 2021-04-05 DIAGNOSIS — Z0000 Encounter for general adult medical examination without abnormal findings: Secondary | ICD-10-CM | POA: Diagnosis not present

## 2021-04-05 DIAGNOSIS — Z23 Encounter for immunization: Secondary | ICD-10-CM | POA: Diagnosis not present

## 2021-04-11 DIAGNOSIS — Z7901 Long term (current) use of anticoagulants: Secondary | ICD-10-CM | POA: Diagnosis not present

## 2021-04-11 DIAGNOSIS — I10 Essential (primary) hypertension: Secondary | ICD-10-CM | POA: Diagnosis not present

## 2021-04-11 DIAGNOSIS — I480 Paroxysmal atrial fibrillation: Secondary | ICD-10-CM | POA: Diagnosis not present

## 2021-04-11 DIAGNOSIS — E7800 Pure hypercholesterolemia, unspecified: Secondary | ICD-10-CM | POA: Diagnosis not present

## 2021-04-11 DIAGNOSIS — E1169 Type 2 diabetes mellitus with other specified complication: Secondary | ICD-10-CM | POA: Diagnosis not present

## 2021-04-12 DIAGNOSIS — I10 Essential (primary) hypertension: Secondary | ICD-10-CM | POA: Diagnosis not present

## 2021-04-12 DIAGNOSIS — E1169 Type 2 diabetes mellitus with other specified complication: Secondary | ICD-10-CM | POA: Diagnosis not present

## 2021-04-12 DIAGNOSIS — E7800 Pure hypercholesterolemia, unspecified: Secondary | ICD-10-CM | POA: Diagnosis not present

## 2021-04-16 ENCOUNTER — Ambulatory Visit: Payer: Medicare Other | Admitting: *Deleted

## 2021-04-16 ENCOUNTER — Other Ambulatory Visit: Payer: Self-pay

## 2021-04-16 DIAGNOSIS — I4819 Other persistent atrial fibrillation: Secondary | ICD-10-CM | POA: Diagnosis not present

## 2021-04-16 DIAGNOSIS — Z5181 Encounter for therapeutic drug level monitoring: Secondary | ICD-10-CM

## 2021-04-16 DIAGNOSIS — I4891 Unspecified atrial fibrillation: Secondary | ICD-10-CM

## 2021-04-16 LAB — POCT INR: INR: 2.6 (ref 2.0–3.0)

## 2021-04-16 NOTE — Patient Instructions (Addendum)
Description   Continue taking 2 tablets daily except 1.5 tablets on Mondays. Recheck INR 1 week post procedure. Call us with any medication changes or concerns # [phone] [facility]. Colonoscopy on 04/24/2021   10/20: Last dose of warfarin.  10/21: No warfarin.  10/22: No warfarin.  10/23: No warfarin.  10/24: No warfarin.  10/25: No warfarin.  10/26: Procedure Day - Resume warfarin in the evening or as directed by doctor & take an extra 1/2 tablet with normal dose.  10/27: Take an extra 1/2 tablet of warfarin with normal dose.

## 2021-04-24 DIAGNOSIS — D120 Benign neoplasm of cecum: Secondary | ICD-10-CM | POA: Diagnosis not present

## 2021-04-24 DIAGNOSIS — D125 Benign neoplasm of sigmoid colon: Secondary | ICD-10-CM | POA: Diagnosis not present

## 2021-04-24 DIAGNOSIS — D123 Benign neoplasm of transverse colon: Secondary | ICD-10-CM | POA: Diagnosis not present

## 2021-04-24 DIAGNOSIS — K5730 Diverticulosis of large intestine without perforation or abscess without bleeding: Secondary | ICD-10-CM | POA: Diagnosis not present

## 2021-04-24 DIAGNOSIS — D124 Benign neoplasm of descending colon: Secondary | ICD-10-CM | POA: Diagnosis not present

## 2021-04-24 DIAGNOSIS — Z86010 Personal history of colonic polyps: Secondary | ICD-10-CM | POA: Diagnosis not present

## 2021-04-24 DIAGNOSIS — K648 Other hemorrhoids: Secondary | ICD-10-CM | POA: Diagnosis not present

## 2021-04-26 DIAGNOSIS — D125 Benign neoplasm of sigmoid colon: Secondary | ICD-10-CM | POA: Diagnosis not present

## 2021-04-26 DIAGNOSIS — D123 Benign neoplasm of transverse colon: Secondary | ICD-10-CM | POA: Diagnosis not present

## 2021-04-26 DIAGNOSIS — D124 Benign neoplasm of descending colon: Secondary | ICD-10-CM | POA: Diagnosis not present

## 2021-04-26 DIAGNOSIS — D120 Benign neoplasm of cecum: Secondary | ICD-10-CM | POA: Diagnosis not present

## 2021-05-01 ENCOUNTER — Ambulatory Visit: Payer: Medicare Other

## 2021-05-01 ENCOUNTER — Other Ambulatory Visit: Payer: Self-pay

## 2021-05-01 DIAGNOSIS — Z5181 Encounter for therapeutic drug level monitoring: Secondary | ICD-10-CM

## 2021-05-01 DIAGNOSIS — I4891 Unspecified atrial fibrillation: Secondary | ICD-10-CM | POA: Diagnosis not present

## 2021-05-01 LAB — POCT INR: INR: 1.7 — AB (ref 2.0–3.0)

## 2021-05-01 NOTE — Patient Instructions (Signed)
Description   Take 2.5 tablets today and 2.5 tablets tomorrow and then continue taking 2 tablets daily except 1.5 tablets on Mondays. Recheck INR 1 week. Call us with any medication changes or concerns # [phone] [facility]. Colonoscopy on 04/24/2021

## 2021-05-08 ENCOUNTER — Ambulatory Visit: Payer: Medicare Other | Admitting: *Deleted

## 2021-05-08 ENCOUNTER — Other Ambulatory Visit: Payer: Self-pay

## 2021-05-08 DIAGNOSIS — I4891 Unspecified atrial fibrillation: Secondary | ICD-10-CM

## 2021-05-08 DIAGNOSIS — Z5181 Encounter for therapeutic drug level monitoring: Secondary | ICD-10-CM | POA: Diagnosis not present

## 2021-05-08 LAB — POCT INR: INR: 2.7 (ref 2.0–3.0)

## 2021-05-08 NOTE — Patient Instructions (Signed)
Description   Continue to take 2 tablets daily except for 1.5 tablets on Mondays. Recheck INR in 2 weeks. [facility] [phone].

## 2021-05-22 ENCOUNTER — Other Ambulatory Visit: Payer: Self-pay

## 2021-05-22 ENCOUNTER — Ambulatory Visit: Payer: Medicare Other | Admitting: *Deleted

## 2021-05-22 DIAGNOSIS — I4891 Unspecified atrial fibrillation: Secondary | ICD-10-CM

## 2021-05-22 DIAGNOSIS — Z5181 Encounter for therapeutic drug level monitoring: Secondary | ICD-10-CM | POA: Diagnosis not present

## 2021-05-22 DIAGNOSIS — I4819 Other persistent atrial fibrillation: Secondary | ICD-10-CM

## 2021-05-22 LAB — POCT INR: INR: 1.8 — AB (ref 2.0–3.0)

## 2021-05-22 NOTE — Patient Instructions (Signed)
Description   Since you took an extra 1/2 tablet last night, continue taking Warfarin 2 tablets daily except for 1.5 tablets on Mondays. Recheck INR in 3 weeks. [facility] [phone].

## 2021-06-06 DIAGNOSIS — E1169 Type 2 diabetes mellitus with other specified complication: Secondary | ICD-10-CM | POA: Diagnosis not present

## 2021-06-06 DIAGNOSIS — I4891 Unspecified atrial fibrillation: Secondary | ICD-10-CM | POA: Diagnosis not present

## 2021-06-06 DIAGNOSIS — D638 Anemia in other chronic diseases classified elsewhere: Secondary | ICD-10-CM | POA: Diagnosis not present

## 2021-06-06 DIAGNOSIS — I480 Paroxysmal atrial fibrillation: Secondary | ICD-10-CM | POA: Diagnosis not present

## 2021-06-06 DIAGNOSIS — E1165 Type 2 diabetes mellitus with hyperglycemia: Secondary | ICD-10-CM | POA: Diagnosis not present

## 2021-06-06 DIAGNOSIS — E7800 Pure hypercholesterolemia, unspecified: Secondary | ICD-10-CM | POA: Diagnosis not present

## 2021-06-06 DIAGNOSIS — I10 Essential (primary) hypertension: Secondary | ICD-10-CM | POA: Diagnosis not present

## 2021-06-06 DIAGNOSIS — D649 Anemia, unspecified: Secondary | ICD-10-CM | POA: Diagnosis not present

## 2021-06-06 DIAGNOSIS — E785 Hyperlipidemia, unspecified: Secondary | ICD-10-CM | POA: Diagnosis not present

## 2021-06-12 ENCOUNTER — Ambulatory Visit: Payer: Medicare Other | Admitting: *Deleted

## 2021-06-12 ENCOUNTER — Other Ambulatory Visit: Payer: Self-pay

## 2021-06-12 DIAGNOSIS — I4819 Other persistent atrial fibrillation: Secondary | ICD-10-CM | POA: Diagnosis not present

## 2021-06-12 DIAGNOSIS — Z5181 Encounter for therapeutic drug level monitoring: Secondary | ICD-10-CM | POA: Diagnosis not present

## 2021-06-12 DIAGNOSIS — I4891 Unspecified atrial fibrillation: Secondary | ICD-10-CM

## 2021-06-12 LAB — POCT INR: INR: 2.5 (ref 2.0–3.0)

## 2021-06-12 NOTE — Patient Instructions (Signed)
Description   Continue taking Warfarin 2 tablets daily except for 1.5 tablets on Mondays. Recheck INR in 4 weeks. [facility] [phone].

## 2021-07-01 DIAGNOSIS — E119 Type 2 diabetes mellitus without complications: Secondary | ICD-10-CM | POA: Diagnosis not present

## 2021-07-03 DIAGNOSIS — G4733 Obstructive sleep apnea (adult) (pediatric): Secondary | ICD-10-CM | POA: Diagnosis not present

## 2021-07-03 DIAGNOSIS — G4731 Primary central sleep apnea: Secondary | ICD-10-CM | POA: Diagnosis not present

## 2021-07-10 ENCOUNTER — Other Ambulatory Visit: Payer: Self-pay

## 2021-07-10 ENCOUNTER — Ambulatory Visit: Payer: Medicare Other | Admitting: *Deleted

## 2021-07-10 DIAGNOSIS — Z5181 Encounter for therapeutic drug level monitoring: Secondary | ICD-10-CM | POA: Diagnosis not present

## 2021-07-10 DIAGNOSIS — I4891 Unspecified atrial fibrillation: Secondary | ICD-10-CM

## 2021-07-10 DIAGNOSIS — I4819 Other persistent atrial fibrillation: Secondary | ICD-10-CM

## 2021-07-10 LAB — POCT INR: INR: 2.7 (ref 2.0–3.0)

## 2021-07-10 NOTE — Patient Instructions (Signed)
Description   Continue taking Warfarin 2 tablets daily except for 1.5 tablets on Mondays. Recheck INR in 5 weeks. [facility] [phone].

## 2021-07-25 ENCOUNTER — Other Ambulatory Visit: Payer: Self-pay | Admitting: Cardiology

## 2021-07-25 DIAGNOSIS — I4819 Other persistent atrial fibrillation: Secondary | ICD-10-CM

## 2021-08-15 ENCOUNTER — Ambulatory Visit: Payer: Medicare Other | Admitting: *Deleted

## 2021-08-15 ENCOUNTER — Other Ambulatory Visit: Payer: Self-pay

## 2021-08-15 DIAGNOSIS — I4819 Other persistent atrial fibrillation: Secondary | ICD-10-CM | POA: Diagnosis not present

## 2021-08-15 DIAGNOSIS — Z5181 Encounter for therapeutic drug level monitoring: Secondary | ICD-10-CM | POA: Diagnosis not present

## 2021-08-15 DIAGNOSIS — I4891 Unspecified atrial fibrillation: Secondary | ICD-10-CM | POA: Diagnosis not present

## 2021-08-15 LAB — POCT INR: INR: 3 (ref 2.0–3.0)

## 2021-08-15 NOTE — Patient Instructions (Signed)
Description   Continue taking Warfarin 2 tablets daily except for 1.5 tablets on Mondays. Have some leafy veggies today and remain consistent. Recheck INR in 6 weeks. [facility] [phone].

## 2021-09-21 ENCOUNTER — Other Ambulatory Visit: Payer: Self-pay | Admitting: Cardiology

## 2021-09-26 ENCOUNTER — Ambulatory Visit: Payer: Medicare Other | Admitting: *Deleted

## 2021-09-26 DIAGNOSIS — I4819 Other persistent atrial fibrillation: Secondary | ICD-10-CM

## 2021-09-26 DIAGNOSIS — Z5181 Encounter for therapeutic drug level monitoring: Secondary | ICD-10-CM

## 2021-09-26 DIAGNOSIS — I4891 Unspecified atrial fibrillation: Secondary | ICD-10-CM | POA: Diagnosis not present

## 2021-09-26 LAB — POCT INR: INR: 3.1 — AB (ref 2.0–3.0)

## 2021-09-26 NOTE — Patient Instructions (Signed)
Description   ?Today take 1 tablet then continue taking Warfarin 2 tablets daily except for 1.5 tablets on Mondays. Have some leafy veggies today and remain consistent. Recheck INR in 6 weeks. [facility] [phone].  ?  ?  ?

## 2021-10-01 DIAGNOSIS — G4731 Primary central sleep apnea: Secondary | ICD-10-CM | POA: Diagnosis not present

## 2021-10-01 DIAGNOSIS — G4733 Obstructive sleep apnea (adult) (pediatric): Secondary | ICD-10-CM | POA: Diagnosis not present

## 2021-10-23 DIAGNOSIS — E1165 Type 2 diabetes mellitus with hyperglycemia: Secondary | ICD-10-CM | POA: Diagnosis not present

## 2021-10-23 DIAGNOSIS — D638 Anemia in other chronic diseases classified elsewhere: Secondary | ICD-10-CM | POA: Diagnosis not present

## 2021-10-23 DIAGNOSIS — E1169 Type 2 diabetes mellitus with other specified complication: Secondary | ICD-10-CM | POA: Diagnosis not present

## 2021-10-23 DIAGNOSIS — E785 Hyperlipidemia, unspecified: Secondary | ICD-10-CM | POA: Diagnosis not present

## 2021-10-23 DIAGNOSIS — I4891 Unspecified atrial fibrillation: Secondary | ICD-10-CM | POA: Diagnosis not present

## 2021-10-23 DIAGNOSIS — D649 Anemia, unspecified: Secondary | ICD-10-CM | POA: Diagnosis not present

## 2021-10-23 DIAGNOSIS — I480 Paroxysmal atrial fibrillation: Secondary | ICD-10-CM | POA: Diagnosis not present

## 2021-10-23 DIAGNOSIS — I10 Essential (primary) hypertension: Secondary | ICD-10-CM | POA: Diagnosis not present

## 2021-10-23 DIAGNOSIS — E7800 Pure hypercholesterolemia, unspecified: Secondary | ICD-10-CM | POA: Diagnosis not present

## 2021-11-07 ENCOUNTER — Ambulatory Visit: Payer: Medicare Other

## 2021-11-07 DIAGNOSIS — I4819 Other persistent atrial fibrillation: Secondary | ICD-10-CM | POA: Diagnosis not present

## 2021-11-07 DIAGNOSIS — Z5181 Encounter for therapeutic drug level monitoring: Secondary | ICD-10-CM | POA: Diagnosis not present

## 2021-11-07 DIAGNOSIS — I4891 Unspecified atrial fibrillation: Secondary | ICD-10-CM

## 2021-11-07 LAB — POCT INR: INR: 2.6 (ref 2.0–3.0)

## 2021-11-07 NOTE — Patient Instructions (Signed)
Description   ?Continue taking Warfarin 2 tablets daily except for 1.5 tablets on Mondays. Remain consistent with greens each week.  ?Recheck INR in 6 weeks. ? [facility] [phone].  ?  ?   ?

## 2021-12-19 ENCOUNTER — Ambulatory Visit: Payer: Medicare Other

## 2021-12-19 DIAGNOSIS — Z5181 Encounter for therapeutic drug level monitoring: Secondary | ICD-10-CM | POA: Diagnosis not present

## 2021-12-19 DIAGNOSIS — I4891 Unspecified atrial fibrillation: Secondary | ICD-10-CM

## 2021-12-19 DIAGNOSIS — I4819 Other persistent atrial fibrillation: Secondary | ICD-10-CM

## 2021-12-19 LAB — POCT INR: INR: 2.3 (ref 2.0–3.0)

## 2021-12-19 NOTE — Patient Instructions (Signed)
Description   Continue taking Warfarin 2 tablets daily except for 1.5 tablets on Mondays.  Remain consistent with greens each week.  Recheck INR in 6 weeks.  [facility] [phone].

## 2021-12-21 ENCOUNTER — Other Ambulatory Visit: Payer: Self-pay | Admitting: Cardiology

## 2021-12-26 ENCOUNTER — Telehealth: Payer: Self-pay

## 2021-12-26 ENCOUNTER — Telehealth: Payer: Self-pay | Admitting: Cardiology

## 2021-12-26 NOTE — Telephone Encounter (Signed)
Left message for patient to call back  

## 2021-12-26 NOTE — Telephone Encounter (Signed)
Pt returning nurse's call. Please advise

## 2021-12-26 NOTE — Telephone Encounter (Signed)
Spoke with the patient and advised that he should not be taking HCTZ as this was discontinued due to causing low BP's. Patient states that he was just calling to make sure.

## 2021-12-30 DIAGNOSIS — G4733 Obstructive sleep apnea (adult) (pediatric): Secondary | ICD-10-CM | POA: Diagnosis not present

## 2021-12-30 DIAGNOSIS — G4731 Primary central sleep apnea: Secondary | ICD-10-CM | POA: Diagnosis not present

## 2022-01-12 DIAGNOSIS — M7918 Myalgia, other site: Secondary | ICD-10-CM | POA: Diagnosis not present

## 2022-01-17 ENCOUNTER — Other Ambulatory Visit: Payer: Self-pay | Admitting: Cardiology

## 2022-01-17 DIAGNOSIS — I4819 Other persistent atrial fibrillation: Secondary | ICD-10-CM

## 2022-01-30 ENCOUNTER — Ambulatory Visit: Payer: Medicare Other

## 2022-01-30 DIAGNOSIS — Z5181 Encounter for therapeutic drug level monitoring: Secondary | ICD-10-CM

## 2022-01-30 DIAGNOSIS — I4819 Other persistent atrial fibrillation: Secondary | ICD-10-CM | POA: Diagnosis not present

## 2022-01-30 DIAGNOSIS — I4891 Unspecified atrial fibrillation: Secondary | ICD-10-CM

## 2022-01-30 LAB — POCT INR: INR: 2.7 (ref 2.0–3.0)

## 2022-01-30 NOTE — Patient Instructions (Signed)
Continue taking Warfarin 2 tablets daily except for 1.5 tablets on Mondays.  Remain consistent with greens each week.  Recheck INR in 6 weeks.  [facility] [phone].

## 2022-02-18 ENCOUNTER — Other Ambulatory Visit: Payer: Self-pay

## 2022-02-18 ENCOUNTER — Other Ambulatory Visit: Payer: Self-pay | Admitting: Cardiology

## 2022-02-18 ENCOUNTER — Inpatient Hospital Stay: Payer: Medicare Other | Attending: Hematology and Oncology

## 2022-02-18 DIAGNOSIS — D6859 Other primary thrombophilia: Secondary | ICD-10-CM | POA: Insufficient documentation

## 2022-02-18 DIAGNOSIS — K5730 Diverticulosis of large intestine without perforation or abscess without bleeding: Secondary | ICD-10-CM | POA: Insufficient documentation

## 2022-02-18 DIAGNOSIS — D472 Monoclonal gammopathy: Secondary | ICD-10-CM | POA: Diagnosis present

## 2022-02-18 DIAGNOSIS — Z860100 Personal history of colon polyps, unspecified: Secondary | ICD-10-CM | POA: Insufficient documentation

## 2022-02-18 DIAGNOSIS — Z7901 Long term (current) use of anticoagulants: Secondary | ICD-10-CM | POA: Insufficient documentation

## 2022-02-18 DIAGNOSIS — D649 Anemia, unspecified: Secondary | ICD-10-CM | POA: Insufficient documentation

## 2022-02-18 DIAGNOSIS — Z8546 Personal history of malignant neoplasm of prostate: Secondary | ICD-10-CM | POA: Insufficient documentation

## 2022-02-18 DIAGNOSIS — I441 Atrioventricular block, second degree: Secondary | ICD-10-CM | POA: Insufficient documentation

## 2022-02-18 DIAGNOSIS — I10 Essential (primary) hypertension: Secondary | ICD-10-CM | POA: Insufficient documentation

## 2022-02-18 DIAGNOSIS — M2140 Flat foot [pes planus] (acquired), unspecified foot: Secondary | ICD-10-CM | POA: Insufficient documentation

## 2022-02-18 DIAGNOSIS — E7800 Pure hypercholesterolemia, unspecified: Secondary | ICD-10-CM | POA: Insufficient documentation

## 2022-02-18 DIAGNOSIS — E785 Hyperlipidemia, unspecified: Secondary | ICD-10-CM | POA: Insufficient documentation

## 2022-02-18 DIAGNOSIS — E1165 Type 2 diabetes mellitus with hyperglycemia: Secondary | ICD-10-CM | POA: Insufficient documentation

## 2022-02-18 DIAGNOSIS — E1169 Type 2 diabetes mellitus with other specified complication: Secondary | ICD-10-CM | POA: Insufficient documentation

## 2022-02-18 LAB — CMP (CANCER CENTER ONLY)
ALT: 10 U/L (ref 0–44)
AST: 10 U/L — ABNORMAL LOW (ref 15–41)
Albumin: 4.1 g/dL (ref 3.5–5.0)
Alkaline Phosphatase: 75 U/L (ref 38–126)
Anion gap: 5 (ref 5–15)
BUN: 15 mg/dL (ref 8–23)
CO2: 26 mmol/L (ref 22–32)
Calcium: 9.4 mg/dL (ref 8.9–10.3)
Chloride: 108 mmol/L (ref 98–111)
Creatinine: 1.09 mg/dL (ref 0.61–1.24)
GFR, Estimated: >60 mL/min (ref >60)
Glucose, Bld: 121 mg/dL — ABNORMAL HIGH (ref 70–99)
Potassium: 4.1 mmol/L (ref 3.5–5.1)
Sodium: 139 mmol/L (ref 135–145)
Total Bilirubin: 0.8 mg/dL (ref 0.3–1.2)
Total Protein: 7.5 g/dL (ref 6.5–8.1)

## 2022-02-18 LAB — CBC WITH DIFFERENTIAL (CANCER CENTER ONLY)
Abs Immature Granulocytes: 0.02 10*3/uL (ref 0.00–0.07)
Basophils Absolute: 0 10*3/uL (ref 0.0–0.1)
Basophils Relative: 0 %
Eosinophils Absolute: 0.3 10*3/uL (ref 0.0–0.5)
Eosinophils Relative: 5 %
HCT: 36.5 % — ABNORMAL LOW (ref 39.0–52.0)
Hemoglobin: 12.1 g/dL — ABNORMAL LOW (ref 13.0–17.0)
Immature Granulocytes: 0 %
Lymphocytes Relative: 19 %
Lymphs Abs: 1 10*3/uL (ref 0.7–4.0)
MCH: 29.6 pg (ref 26.0–34.0)
MCHC: 33.2 g/dL (ref 30.0–36.0)
MCV: 89.2 fL (ref 80.0–100.0)
Monocytes Absolute: 0.3 10*3/uL (ref 0.1–1.0)
Monocytes Relative: 7 %
Neutro Abs: 3.5 10*3/uL (ref 1.7–7.7)
Neutrophils Relative %: 69 %
Platelet Count: 217 10*3/uL (ref 150–400)
RBC: 4.09 MIL/uL — ABNORMAL LOW (ref 4.22–5.81)
RDW: 13 % (ref 11.5–15.5)
WBC Count: 5 10*3/uL (ref 4.0–10.5)
nRBC: 0 % (ref 0.0–0.2)

## 2022-02-18 NOTE — Progress Notes (Signed)
Patient Care Team: Vernie Shanks, MD (Inactive) as PCP - General (Family Medicine) Sueanne Margarita, MD as PCP - Cardiology (Cardiology) Sueanne Margarita, MD as Consulting Physician (Cardiology)  DIAGNOSIS:  Encounter Diagnosis  Name Primary?   MGUS (monoclonal gammopathy of unknown significance)       CHIEF COMPLIANT: Follow-up MGUS  INTERVAL HISTORY: Alec Hunt is a 71 y.o. male with above-mentioned history of MGUS. He presents to the clinic today for labs and follow-up. He states he has been doing good. Energy levels are back up. He has been riding an electric bike to stay active.   ALLERGIES:  is allergic to hydrochlorothiazide.  MEDICATIONS:  Current Outpatient Medications  Medication Sig Dispense Refill   losartan (COZAAR) 100 MG tablet Take 1 tablet (100 mg total) by mouth in the morning. TAKE 1 TABLET BY MOUTH EVERY DAY IN THE MORNING. Please keep appt with Dr. Radford Pax before anymore refills. 30 tablet 0   metFORMIN (GLUCOPHAGE) 500 MG tablet Take 1,000 mg by mouth 2 (two) times daily.      rosuvastatin (CRESTOR) 10 MG tablet Take 10 mg by mouth daily.     warfarin (COUMADIN) 5 MG tablet TAKE 1.5 TO 2 TABLETS DAILY BY MOUTH AS DIRECTED BY [facility]IN CLINIC 180 tablet 1   No current facility-administered medications for this visit.    PHYSICAL EXAMINATION: ECOG PERFORMANCE STATUS: 1 - Symptomatic but completely ambulatory  Vitals:   02/25/22 0935  BP: (!) 147/85  Pulse: (!) 57  Resp: 19  Temp: (!) 97.5 F (36.4 C)  SpO2: 100%   Filed Weights   02/25/22 0935  Weight: (!) 330 lb 11.2 oz (150 kg)     LABORATORY DATA:  I have reviewed the data as listed    Latest Ref Rng & Units 02/18/2022    7:36 AM 02/18/2021    7:50 AM 08/07/2020    8:00 AM  CMP  Glucose 70 - 99 mg/dL 121  115  123   BUN 8 - 23 mg/dL 15  15  19    Creatinine 0.61 - 1.24 mg/dL 1.09  1.20  1.10   Sodium 135 - 145 mmol/L 139  137  138   Potassium 3.5 - 5.1 mmol/L 4.1  4.2  3.9    Chloride 98 - 111 mmol/L 108  105  104   CO2 22 - 32 mmol/L 26  23  24    Calcium 8.9 - 10.3 mg/dL 9.4  9.7  9.3   Total Protein 6.5 - 8.1 g/dL 7.5  8.0  8.3   Total Bilirubin 0.3 - 1.2 mg/dL 0.8  0.7  0.6   Alkaline Phos 38 - 126 U/L 75  81  82   AST 15 - 41 U/L 10  11  9    ALT 0 - 44 U/L 10  16  10      Lab Results  Component Value Date   WBC 5.0 02/18/2022   HGB 12.1 (L) 02/18/2022   HCT 36.5 (L) 02/18/2022   MCV 89.2 02/18/2022   PLT 217 02/18/2022   NEUTROABS 3.5 02/18/2022    ASSESSMENT & PLAN:  MGUS (monoclonal gammopathy of unknown significance) 08/23/2019: Hemoglobin 12.5, MCV 87.2, serum creatinine 1.02 12/02/19 showed: Hg 10.9, HCT 31.9, MCV 87.9, platelets 278, ferritin 563, iron 61, creatinine 0.97 01/24/2020: Hemoglobin 10.5, MCV 88.4, TSH 1.36, LDH 129, M protein 1.2 g IgG lambda, IgG 2298, B12 232, haptoglobin 260, erythropoietin 13.9 08/07/20: TP: 8.3, Kappa: 28.7, Lambda: 47.3, Ratio  0.61, IgG 2408, M-Protein: 1.3 (was 1.2)  IgG Lambda 02/18/2021: Hemoglobin 11.6, M protein 0.9 g IgG lambda (was 1.3 g), R29.2, lambda 41.3, ratio 0.71: Normal, creatinine 1.2, calcium 9.7, beta-2 microglobulin 2  02/18/2022: Hemoglobin 12.1, creatinine 1.09, calcium 1.01, Kappa 25, lambda: 37.9, ratio 0.66, beta-2 microglobulin 1.8, M protein 1.1 g   Bone marrow biopsy 02/01/2020: Limited marrow aspirate at 2% plasma cells and core biopsy about 5% plasma cells.  Light chain in situ hybridization shows polytypic population.   I discussed with the patient that overall the M protein has been relatively steady. Continue monitoring once a year    Orders Placed This Encounter  Procedures   Kappa/lambda light chains    Standing Status:   Future    Standing Expiration Date:   02/26/2023   Multiple Myeloma Panel (SPEP&IFE w/QIG)    Standing Status:   Future    Standing Expiration Date:   02/26/2023   Beta 2 microglobulin, serum    Standing Status:   Future    Standing Expiration Date:    02/26/2023   CBC with Differential ([facility] Only)    Standing Status:   Future    Standing Expiration Date:   02/26/2023   CMP ([facility] only)    Standing Status:   Future    Standing Expiration Date:   02/26/2023   The patient has a good understanding of the overall plan. he agrees with it. he will call with any problems that may develop before the next visit here. Total time spent: 30 mins including face to face time and time spent for planning, charting and co-ordination of care   Harriette Ohara, MD 02/25/22    I Gardiner Coins am scribing for Dr. Lindi Adie  I have reviewed the above documentation for accuracy and completeness, and I agree with the above.

## 2022-02-19 LAB — KAPPA/LAMBDA LIGHT CHAINS
Kappa free light chain: 25 mg/L — ABNORMAL HIGH (ref 3.3–19.4)
Kappa, lambda light chain ratio: 0.66 (ref 0.26–1.65)
Lambda free light chains: 37.9 mg/L — ABNORMAL HIGH (ref 5.7–26.3)

## 2022-02-19 LAB — BETA 2 MICROGLOBULIN, SERUM: Beta-2 Microglobulin: 1.8 mg/L (ref 0.6–2.4)

## 2022-02-24 LAB — MULTIPLE MYELOMA PANEL, SERUM
Albumin SerPl Elph-Mcnc: 3.5 g/dL (ref 2.9–4.4)
Albumin/Glob SerPl: 1.1 (ref 0.7–1.7)
Alpha 1: 0.2 g/dL (ref 0.0–0.4)
Alpha2 Glob SerPl Elph-Mcnc: 0.9 g/dL (ref 0.4–1.0)
B-Globulin SerPl Elph-Mcnc: 0.9 g/dL (ref 0.7–1.3)
Gamma Glob SerPl Elph-Mcnc: 1.6 g/dL (ref 0.4–1.8)
Globulin, Total: 3.5 g/dL (ref 2.2–3.9)
IgA: 70 mg/dL (ref 61–437)
IgG (Immunoglobin G), Serum: 1814 mg/dL — ABNORMAL HIGH (ref 603–1613)
IgM (Immunoglobulin M), Srm: 25 mg/dL (ref 15–143)
M Protein SerPl Elph-Mcnc: 1.1 g/dL — ABNORMAL HIGH
Total Protein ELP: 7 g/dL (ref 6.0–8.5)

## 2022-02-25 ENCOUNTER — Inpatient Hospital Stay: Payer: Medicare Other | Admitting: Hematology and Oncology

## 2022-02-25 ENCOUNTER — Other Ambulatory Visit: Payer: Self-pay

## 2022-02-25 DIAGNOSIS — D472 Monoclonal gammopathy: Secondary | ICD-10-CM | POA: Diagnosis not present

## 2022-02-25 NOTE — Assessment & Plan Note (Signed)
08/23/2019: Hemoglobin 12.5, MCV 87.2,serum creatinine 1.02 12/02/19 showed: Hg 10.9, HCT 31.9, MCV 87.9, platelets 278, ferritin 563, iron 61, creatinine 0.97 01/24/2020: Hemoglobin 10.5, MCV 88.4, TSH 1.36, LDH 129, M protein 1.2 g IgG lambda, IgG 2298, B12 232, haptoglobin 260, erythropoietin 13.9 08/07/20: TP: 8.3, Kappa: 28.7, Lambda: 47.3, Ratio 0.61, IgG 2408, M-Protein: 1.3 (was 1.2) IgG Lambda 02/18/2021: Hemoglobin 11.6, M protein 0.9 g IgG lambda (was 1.3 g), R29.2, lambda 41.3, ratio 0.71: Normal, creatinine 1.2, calcium 9.7, beta-2 microglobulin 2  02/18/2022: Hemoglobin 12.1, creatinine 1.09, calcium 1.01, Kappa 25, lambda: 37.9, ratio 0.66, beta-2 microglobulin 1.8, M protein 1.1 g  Bone marrow biopsy 02/01/2020: Limited marrow aspirate at 2% plasma cells and core biopsy about 5% plasma cells. Light chain in situ hybridization shows polytypic population.   I discussed with the patient that overall the M protein has been relatively steady. Continue monitoring once a year

## 2022-03-13 ENCOUNTER — Ambulatory Visit: Payer: Medicare Other | Attending: Cardiovascular Disease

## 2022-03-13 ENCOUNTER — Other Ambulatory Visit: Payer: Self-pay | Admitting: Cardiology

## 2022-03-13 DIAGNOSIS — I4891 Unspecified atrial fibrillation: Secondary | ICD-10-CM | POA: Diagnosis not present

## 2022-03-13 DIAGNOSIS — I4819 Other persistent atrial fibrillation: Secondary | ICD-10-CM | POA: Diagnosis not present

## 2022-03-13 DIAGNOSIS — Z5181 Encounter for therapeutic drug level monitoring: Secondary | ICD-10-CM

## 2022-03-13 LAB — POCT INR: INR: 2.8 (ref 2.0–3.0)

## 2022-03-13 NOTE — Patient Instructions (Signed)
Continue taking Warfarin 2 tablets daily except for 1.5 tablets on Mondays.  Remain consistent with greens each week.  Recheck INR in 6 weeks.  [facility] [phone].

## 2022-03-30 DIAGNOSIS — G4731 Primary central sleep apnea: Secondary | ICD-10-CM | POA: Diagnosis not present

## 2022-03-30 DIAGNOSIS — G4733 Obstructive sleep apnea (adult) (pediatric): Secondary | ICD-10-CM | POA: Diagnosis not present

## 2022-04-08 DIAGNOSIS — I480 Paroxysmal atrial fibrillation: Secondary | ICD-10-CM | POA: Diagnosis not present

## 2022-04-08 DIAGNOSIS — Z0000 Encounter for general adult medical examination without abnormal findings: Secondary | ICD-10-CM | POA: Diagnosis not present

## 2022-04-08 DIAGNOSIS — D6869 Other thrombophilia: Secondary | ICD-10-CM | POA: Diagnosis not present

## 2022-04-08 DIAGNOSIS — I10 Essential (primary) hypertension: Secondary | ICD-10-CM | POA: Diagnosis not present

## 2022-04-08 DIAGNOSIS — E7800 Pure hypercholesterolemia, unspecified: Secondary | ICD-10-CM | POA: Diagnosis not present

## 2022-04-08 DIAGNOSIS — E1169 Type 2 diabetes mellitus with other specified complication: Secondary | ICD-10-CM | POA: Diagnosis not present

## 2022-04-24 ENCOUNTER — Ambulatory Visit: Payer: Medicare Other | Attending: Cardiology | Admitting: *Deleted

## 2022-04-24 DIAGNOSIS — I4819 Other persistent atrial fibrillation: Secondary | ICD-10-CM | POA: Diagnosis not present

## 2022-04-24 DIAGNOSIS — Z5181 Encounter for therapeutic drug level monitoring: Secondary | ICD-10-CM

## 2022-04-24 DIAGNOSIS — I4891 Unspecified atrial fibrillation: Secondary | ICD-10-CM

## 2022-04-24 LAB — POCT INR: INR: 3.7 — AB (ref 2.0–3.0)

## 2022-04-24 NOTE — Patient Instructions (Signed)
Description   Do not take any Warfarin today then continue taking Warfarin 2 tablets daily except for 1.5 tablets on Mondays. Remain consistent with greens each week. Recheck INR in 4 weeks.  [facility] [phone] or [phone]

## 2022-05-02 ENCOUNTER — Other Ambulatory Visit: Payer: Self-pay | Admitting: Cardiology

## 2022-05-10 DIAGNOSIS — J019 Acute sinusitis, unspecified: Secondary | ICD-10-CM | POA: Diagnosis not present

## 2022-05-10 DIAGNOSIS — R051 Acute cough: Secondary | ICD-10-CM | POA: Diagnosis not present

## 2022-05-10 DIAGNOSIS — Z03818 Encounter for observation for suspected exposure to other biological agents ruled out: Secondary | ICD-10-CM | POA: Diagnosis not present

## 2022-05-10 DIAGNOSIS — R0981 Nasal congestion: Secondary | ICD-10-CM | POA: Diagnosis not present

## 2022-05-22 ENCOUNTER — Other Ambulatory Visit: Payer: Self-pay | Admitting: Cardiology

## 2022-05-30 ENCOUNTER — Ambulatory Visit: Payer: Medicare Other | Attending: Cardiovascular Disease

## 2022-05-30 DIAGNOSIS — I4891 Unspecified atrial fibrillation: Secondary | ICD-10-CM

## 2022-05-30 DIAGNOSIS — Z5181 Encounter for therapeutic drug level monitoring: Secondary | ICD-10-CM | POA: Diagnosis not present

## 2022-05-30 DIAGNOSIS — I4819 Other persistent atrial fibrillation: Secondary | ICD-10-CM | POA: Diagnosis not present

## 2022-05-30 LAB — POCT INR: INR: 3.1 — AB (ref 2.0–3.0)

## 2022-05-30 NOTE — Patient Instructions (Addendum)
Description   Eat greens today and continue taking Warfarin 2 tablets daily except for 1.5 tablets on Mondays. Remain consistent with greens each week.  Recheck INR in 4 weeks. [facility] [phone]

## 2022-06-18 ENCOUNTER — Other Ambulatory Visit: Payer: Self-pay | Admitting: Cardiology

## 2022-06-27 ENCOUNTER — Ambulatory Visit: Payer: Medicare Other | Attending: Cardiology | Admitting: *Deleted

## 2022-06-27 DIAGNOSIS — I4891 Unspecified atrial fibrillation: Secondary | ICD-10-CM | POA: Diagnosis not present

## 2022-06-27 DIAGNOSIS — Z5181 Encounter for therapeutic drug level monitoring: Secondary | ICD-10-CM

## 2022-06-27 LAB — POCT INR: POC INR: 2.4

## 2022-06-27 NOTE — Patient Instructions (Signed)
Description   Continue taking Warfarin 2 tablets daily except for 1.5 tablets on Mondays. Remain consistent with greens each week.  Recheck INR in 5 weeks. [facility] [phone]

## 2022-06-28 DIAGNOSIS — G4731 Primary central sleep apnea: Secondary | ICD-10-CM | POA: Diagnosis not present

## 2022-06-28 DIAGNOSIS — G4733 Obstructive sleep apnea (adult) (pediatric): Secondary | ICD-10-CM | POA: Diagnosis not present

## 2022-07-03 DIAGNOSIS — E119 Type 2 diabetes mellitus without complications: Secondary | ICD-10-CM | POA: Diagnosis not present

## 2022-07-11 ENCOUNTER — Other Ambulatory Visit: Payer: Self-pay | Admitting: Cardiology

## 2022-07-11 DIAGNOSIS — I4819 Other persistent atrial fibrillation: Secondary | ICD-10-CM

## 2022-08-01 ENCOUNTER — Ambulatory Visit: Payer: Medicare Other | Attending: Cardiovascular Disease

## 2022-08-01 DIAGNOSIS — I4891 Unspecified atrial fibrillation: Secondary | ICD-10-CM

## 2022-08-01 DIAGNOSIS — I4819 Other persistent atrial fibrillation: Secondary | ICD-10-CM | POA: Diagnosis not present

## 2022-08-01 DIAGNOSIS — Z5181 Encounter for therapeutic drug level monitoring: Secondary | ICD-10-CM

## 2022-08-01 LAB — POCT INR: INR: 3.3 — AB (ref 2.0–3.0)

## 2022-08-01 NOTE — Patient Instructions (Signed)
TAKE 1 TABLET ONLY TODAY THEN Continue taking Warfarin 2 tablets daily except for 1.5 tablets on Mondays. Remain consistent with greens each week.  Recheck INR in 5 weeks. [facility] [phone]

## 2022-09-05 ENCOUNTER — Ambulatory Visit: Payer: Medicare Other | Attending: Cardiovascular Disease

## 2022-09-05 DIAGNOSIS — I4819 Other persistent atrial fibrillation: Secondary | ICD-10-CM

## 2022-09-05 DIAGNOSIS — Z5181 Encounter for therapeutic drug level monitoring: Secondary | ICD-10-CM

## 2022-09-05 LAB — POCT INR: INR: 2.8 (ref 2.0–3.0)

## 2022-09-05 NOTE — Patient Instructions (Signed)
Description   Continue taking Warfarin 2 tablets daily except for 1.5 tablets on Mondays. Remain consistent with greens each week.  Recheck INR in 6 weeks. [facility] [phone]

## 2022-09-26 DIAGNOSIS — G4731 Primary central sleep apnea: Secondary | ICD-10-CM | POA: Diagnosis not present

## 2022-09-26 DIAGNOSIS — G4733 Obstructive sleep apnea (adult) (pediatric): Secondary | ICD-10-CM | POA: Diagnosis not present

## 2022-10-14 DIAGNOSIS — D6869 Other thrombophilia: Secondary | ICD-10-CM | POA: Diagnosis not present

## 2022-10-14 DIAGNOSIS — I480 Paroxysmal atrial fibrillation: Secondary | ICD-10-CM | POA: Diagnosis not present

## 2022-10-14 DIAGNOSIS — D638 Anemia in other chronic diseases classified elsewhere: Secondary | ICD-10-CM | POA: Diagnosis not present

## 2022-10-14 DIAGNOSIS — E119 Type 2 diabetes mellitus without complications: Secondary | ICD-10-CM | POA: Diagnosis not present

## 2022-10-14 DIAGNOSIS — E1169 Type 2 diabetes mellitus with other specified complication: Secondary | ICD-10-CM | POA: Diagnosis not present

## 2022-10-14 DIAGNOSIS — I10 Essential (primary) hypertension: Secondary | ICD-10-CM | POA: Diagnosis not present

## 2022-10-17 ENCOUNTER — Ambulatory Visit: Payer: Medicare Other | Attending: Cardiology | Admitting: *Deleted

## 2022-10-17 DIAGNOSIS — Z5181 Encounter for therapeutic drug level monitoring: Secondary | ICD-10-CM

## 2022-10-17 DIAGNOSIS — I4819 Other persistent atrial fibrillation: Secondary | ICD-10-CM

## 2022-10-17 LAB — POCT INR: POC INR: 3.4

## 2022-10-17 NOTE — Patient Instructions (Addendum)
Description   Hold warfarin today, then continue taking Warfarin 2 tablets daily except for 1.5 tablets on Mondays. Remain consistent with greens each week.  Recheck INR in 4 weeks. [facility] [phone]

## 2022-11-14 ENCOUNTER — Ambulatory Visit: Payer: Medicare Other | Attending: Cardiology

## 2022-11-14 DIAGNOSIS — I4819 Other persistent atrial fibrillation: Secondary | ICD-10-CM

## 2022-11-14 DIAGNOSIS — Z5181 Encounter for therapeutic drug level monitoring: Secondary | ICD-10-CM | POA: Diagnosis not present

## 2022-11-14 LAB — POCT INR: INR: 4.2 — AB (ref 2.0–3.0)

## 2022-11-14 NOTE — Patient Instructions (Signed)
Description   Hold warfarin today, then START taking Warfarin 2 tablets daily except for 1.5 tablets on Mondays and Wednesdays.  Remain consistent with greens each week.  Recheck INR in 3 weeks. [facility] [phone]

## 2022-12-05 ENCOUNTER — Ambulatory Visit: Payer: Medicare Other | Attending: Cardiology | Admitting: *Deleted

## 2022-12-05 DIAGNOSIS — I4819 Other persistent atrial fibrillation: Secondary | ICD-10-CM | POA: Diagnosis not present

## 2022-12-05 DIAGNOSIS — Z5181 Encounter for therapeutic drug level monitoring: Secondary | ICD-10-CM | POA: Diagnosis not present

## 2022-12-05 LAB — POCT INR: INR: 3.3 — AB (ref 2.0–3.0)

## 2022-12-05 NOTE — Patient Instructions (Signed)
Description   Hold warfarin today, then START taking the dose you should be taking, which is, Warfarin 2 tablets daily except for 1.5 tablets on Mondays and Wednesdays.  Remain consistent with greens each week.  Recheck INR in 3 weeks. [facility] [phone]

## 2022-12-25 DIAGNOSIS — G4731 Primary central sleep apnea: Secondary | ICD-10-CM | POA: Diagnosis not present

## 2022-12-25 DIAGNOSIS — G4733 Obstructive sleep apnea (adult) (pediatric): Secondary | ICD-10-CM | POA: Diagnosis not present

## 2022-12-26 ENCOUNTER — Ambulatory Visit: Payer: Medicare Other | Attending: Cardiovascular Disease

## 2022-12-26 DIAGNOSIS — I4819 Other persistent atrial fibrillation: Secondary | ICD-10-CM

## 2022-12-26 DIAGNOSIS — Z5181 Encounter for therapeutic drug level monitoring: Secondary | ICD-10-CM

## 2022-12-26 LAB — POCT INR: INR: 2.7 (ref 2.0–3.0)

## 2022-12-26 NOTE — Patient Instructions (Signed)
Continue 2 tablets daily except for 1.5 tablets on Mondays and Wednesdays.  Remain consistent with greens each week.  Recheck INR in 6 weeks. [facility] [phone]

## 2023-01-01 ENCOUNTER — Other Ambulatory Visit: Payer: Self-pay | Admitting: Cardiology

## 2023-01-01 DIAGNOSIS — I4819 Other persistent atrial fibrillation: Secondary | ICD-10-CM

## 2023-01-02 NOTE — Telephone Encounter (Signed)
Warfarin 5mg   Afib  Last INR 12/26/22 Last OV 06/27/22

## 2023-02-06 ENCOUNTER — Ambulatory Visit: Payer: Medicare Other | Attending: Cardiology

## 2023-02-06 DIAGNOSIS — I4819 Other persistent atrial fibrillation: Secondary | ICD-10-CM | POA: Diagnosis not present

## 2023-02-06 DIAGNOSIS — Z5181 Encounter for therapeutic drug level monitoring: Secondary | ICD-10-CM

## 2023-02-06 LAB — POCT INR: INR: 3.3 — AB (ref 2.0–3.0)

## 2023-02-06 NOTE — Patient Instructions (Signed)
TAKE ONLY 1 TABLET TODAY THEN Continue 2 tablets daily except for 1.5 tablets on Mondays and Wednesdays.  Remain consistent with greens each week.  Recheck INR in 6 weeks. [facility] [phone]

## 2023-02-26 ENCOUNTER — Inpatient Hospital Stay: Payer: Medicare Other | Attending: Internal Medicine

## 2023-02-26 ENCOUNTER — Telehealth: Payer: Self-pay

## 2023-02-26 DIAGNOSIS — D472 Monoclonal gammopathy: Secondary | ICD-10-CM | POA: Diagnosis not present

## 2023-02-26 LAB — CMP (CANCER CENTER ONLY)
ALT: 17 U/L (ref 0–44)
AST: 12 U/L — ABNORMAL LOW (ref 15–41)
Albumin: 4 g/dL (ref 3.5–5.0)
Alkaline Phosphatase: 87 U/L (ref 38–126)
Anion gap: 7 (ref 5–15)
BUN: 11 mg/dL (ref 8–23)
CO2: 26 mmol/L (ref 22–32)
Calcium: 9.4 mg/dL (ref 8.9–10.3)
Chloride: 105 mmol/L (ref 98–111)
Creatinine: 1.13 mg/dL (ref 0.61–1.24)
GFR, Estimated: >60 mL/min (ref >60)
Glucose, Bld: 107 mg/dL — ABNORMAL HIGH (ref 70–99)
Potassium: 3.8 mmol/L (ref 3.5–5.1)
Sodium: 138 mmol/L (ref 135–145)
Total Bilirubin: 0.8 mg/dL (ref 0.3–1.2)
Total Protein: 7.9 g/dL (ref 6.5–8.1)

## 2023-02-26 LAB — CBC WITH DIFFERENTIAL (CANCER CENTER ONLY)
Abs Immature Granulocytes: 0.02 10*3/uL (ref 0.00–0.07)
Basophils Absolute: 0 10*3/uL (ref 0.0–0.1)
Basophils Relative: 0 %
Eosinophils Absolute: 0.3 10*3/uL (ref 0.0–0.5)
Eosinophils Relative: 7 %
HCT: 39.4 % (ref 39.0–52.0)
Hemoglobin: 12.9 g/dL — ABNORMAL LOW (ref 13.0–17.0)
Immature Granulocytes: 0 %
Lymphocytes Relative: 18 %
Lymphs Abs: 0.9 10*3/uL (ref 0.7–4.0)
MCH: 29.5 pg (ref 26.0–34.0)
MCHC: 32.7 g/dL (ref 30.0–36.0)
MCV: 90.2 fL (ref 80.0–100.0)
Monocytes Absolute: 0.3 10*3/uL (ref 0.1–1.0)
Monocytes Relative: 7 %
Neutro Abs: 3.3 10*3/uL (ref 1.7–7.7)
Neutrophils Relative %: 68 %
Platelet Count: 214 10*3/uL (ref 150–400)
RBC: 4.37 MIL/uL (ref 4.22–5.81)
RDW: 12.7 % (ref 11.5–15.5)
WBC Count: 4.8 10*3/uL (ref 4.0–10.5)
nRBC: 0 % (ref 0.0–0.2)

## 2023-02-26 NOTE — Telephone Encounter (Signed)
Call to patient to review labs, no answer. Left detailed message per DPR advising that CBC/ BMET results were normal and Dr. Mayford Knife advises to continue current medical therapy.

## 2023-02-26 NOTE — Telephone Encounter (Signed)
-----   Message from Armanda Magic sent at 02/26/2023  9:45 AM EDT ----- Please let patient know that labs were normal.  Continue current medical therapy.

## 2023-02-27 LAB — KAPPA/LAMBDA LIGHT CHAINS
Kappa free light chain: 23 mg/L — ABNORMAL HIGH (ref 3.3–19.4)
Kappa, lambda light chain ratio: 0.59 (ref 0.26–1.65)
Lambda free light chains: 39.1 mg/L — ABNORMAL HIGH (ref 5.7–26.3)

## 2023-03-03 LAB — MULTIPLE MYELOMA PANEL, SERUM
Albumin SerPl Elph-Mcnc: 3.6 g/dL (ref 2.9–4.4)
Albumin/Glob SerPl: 1 (ref 0.7–1.7)
Alpha 1: 0.3 g/dL (ref 0.0–0.4)
Alpha2 Glob SerPl Elph-Mcnc: 0.9 g/dL (ref 0.4–1.0)
B-Globulin SerPl Elph-Mcnc: 0.9 g/dL (ref 0.7–1.3)
Gamma Glob SerPl Elph-Mcnc: 1.8 g/dL (ref 0.4–1.8)
Globulin, Total: 3.8 g/dL (ref 2.2–3.9)
IgA: 91 mg/dL (ref 61–437)
IgG (Immunoglobin G), Serum: 2241 mg/dL — ABNORMAL HIGH (ref 603–1613)
IgM (Immunoglobulin M), Srm: 26 mg/dL (ref 15–143)
M Protein SerPl Elph-Mcnc: 1.1 g/dL — ABNORMAL HIGH
Total Protein ELP: 7.4 g/dL (ref 6.0–8.5)

## 2023-03-05 ENCOUNTER — Inpatient Hospital Stay: Payer: Medicare Other | Attending: Internal Medicine | Admitting: Hematology and Oncology

## 2023-03-05 VITALS — BP 145/92 | HR 89 | Temp 97.3°F | Resp 18 | Ht 73.0 in | Wt 327.3 lb

## 2023-03-05 DIAGNOSIS — D472 Monoclonal gammopathy: Secondary | ICD-10-CM | POA: Insufficient documentation

## 2023-03-05 NOTE — Assessment & Plan Note (Signed)
08/23/2019: Hemoglobin 12.5, MCV 87.2, serum creatinine 1.02 12/02/19 showed: Hg 10.9, HCT 31.9, MCV 87.9, platelets 278, ferritin 563, iron 61, creatinine 0.97 01/24/2020: Hemoglobin 10.5, MCV 88.4, TSH 1.36, LDH 129, M protein 1.2 g IgG lambda, IgG 2298, B12 232, haptoglobin 260, erythropoietin 13.9 08/07/20: TP: 8.3, Kappa: 28.7, Lambda: 47.3, Ratio 0.61, IgG 2408, M-Protein: 1.3 (was 1.2)  IgG Lambda 02/18/2021: Hemoglobin 11.6, M protein 0.9 g IgG lambda (was 1.3 g), R29.2, lambda 41.3, ratio 0.71: Normal, creatinine 1.2, calcium 9.7, beta-2 microglobulin 2  02/18/2022: Hemoglobin 12.1, creatinine 1.09, calcium 1.01, Kappa 25, lambda: 37.9, ratio 0.66, beta-2 microglobulin 1.8, M protein 1.1 g 02/26/2023: Hemoglobin 12.9, creatinine 1.13, calcium 9.4, kappa 23, lambda 39.1, ratio 0.59, M protein 1.1 g, IgG 2241   Bone marrow biopsy 02/01/2020: Limited marrow aspirate at 2% plasma cells and core biopsy about 5% plasma cells.  Light chain in situ hybridization shows polytypic population.    I discussed with the patient that overall the M protein has been relatively steady. Continue monitoring once a year

## 2023-03-05 NOTE — Progress Notes (Signed)
Patient Care Team: Ileana Ladd, MD (Inactive) as PCP - General (Family Medicine) Quintella Reichert, MD as PCP - Cardiology (Cardiology) Quintella Reichert, MD as Consulting Physician (Cardiology)  DIAGNOSIS:  Encounter Diagnosis  Name Primary?   MGUS (monoclonal gammopathy of unknown significance) Yes     CHIEF COMPLIANT: Follow-up MGUS   INTERVAL HISTORY: Alec Hunt is a 72 y.o. male with above-mentioned history of MGUS. He presents to the clinic today for labs and follow-up. Patient reports that he has been doing great. He has no complaints or symptoms to report to the clinic today. His only concern is trying to lose more weight. He does stay active.   ALLERGIES:  is allergic to hydrochlorothiazide.  MEDICATIONS:  Current Outpatient Medications  Medication Sig Dispense Refill   losartan (COZAAR) 100 MG tablet TAKE 1 TABLET (100 MG TOTAL) BY MOUTH IN THE MORNING. PLEASE CALL TO SCHEDULE AN OVERDUE APPOINTMENT WITH DR. Mayford Knife FOR REFILLS, [phone], THANK YOU. 1ST ATTEMPT. 30 tablet 0   PREVNAR 20 0.5 ML injection 0.5 mLs.     rosuvastatin (CRESTOR) 10 MG tablet Take 10 mg by mouth daily.     RYBELSUS 7 MG TABS Take 1 tablet by mouth daily.     warfarin (COUMADIN) 5 MG tablet TAKE 1.5 TO 2 TABLETS DAILY BY MOUTH AS DIRECTED BY COUMADIN CLINIC 180 tablet 1   metFORMIN (GLUCOPHAGE) 500 MG tablet Take 1,000 mg by mouth 2 (two) times daily.     No current facility-administered medications for this visit.    PHYSICAL EXAMINATION: ECOG PERFORMANCE STATUS: 1 - Symptomatic but completely ambulatory  Vitals:   03/05/23 0810  BP: (!) 145/92  Pulse: 89  Resp: 18  Temp: (!) 97.3 F (36.3 C)  SpO2: 100%   Filed Weights   03/05/23 0810  Weight: (!) 327 lb 4.8 oz (148.5 kg)      LABORATORY DATA:  I have reviewed the data as listed    Latest Ref Rng & Units 02/26/2023    7:34 AM 02/18/2022    7:36 AM 02/18/2021    7:50 AM  CMP  Glucose 70 - 99 mg/dL 557  322  025    BUN 8 - 23 mg/dL 11  15  15    Creatinine 0.61 - 1.24 mg/dL 4.27  0.62  3.76   Sodium 135 - 145 mmol/L 138  139  137   Potassium 3.5 - 5.1 mmol/L 3.8  4.1  4.2   Chloride 98 - 111 mmol/L 105  108  105   CO2 22 - 32 mmol/L 26  26  23    Calcium 8.9 - 10.3 mg/dL 9.4  9.4  9.7   Total Protein 6.5 - 8.1 g/dL 7.9  7.5  8.0   Total Bilirubin 0.3 - 1.2 mg/dL 0.8  0.8  0.7   Alkaline Phos 38 - 126 U/L 87  75  81   AST 15 - 41 U/L 12  10  11    ALT 0 - 44 U/L 17  10  16      Lab Results  Component Value Date   WBC 4.8 02/26/2023   HGB 12.9 (L) 02/26/2023   HCT 39.4 02/26/2023   MCV 90.2 02/26/2023   PLT 214 02/26/2023   NEUTROABS 3.3 02/26/2023    ASSESSMENT & PLAN:  MGUS (monoclonal gammopathy of unknown significance) 08/23/2019: Hemoglobin 12.5, MCV 87.2, serum creatinine 1.02 12/02/19 showed: Hg 10.9, HCT 31.9, MCV 87.9, platelets 278, ferritin 563, iron 61, creatinine  0.97 01/24/2020: Hemoglobin 10.5, MCV 88.4, TSH 1.36, LDH 129, M protein 1.2 g IgG lambda, IgG 2298, B12 232, haptoglobin 260, erythropoietin 13.9 08/07/20: TP: 8.3, Kappa: 28.7, Lambda: 47.3, Ratio 0.61, IgG 2408, M-Protein: 1.3 (was 1.2)  IgG Lambda 02/18/2021: Hemoglobin 11.6, M protein 0.9 g IgG lambda (was 1.3 g), R29.2, lambda 41.3, ratio 0.71: Normal, creatinine 1.2, calcium 9.7, beta-2 microglobulin 2  02/18/2022: Hemoglobin 12.1, creatinine 1.09, calcium 1.01, Kappa 25, lambda: 37.9, ratio 0.66, beta-2 microglobulin 1.8, M protein 1.1 g 02/26/2023: Hemoglobin 12.9, creatinine 1.13, calcium 9.4, kappa 23, lambda 39.1, ratio 0.59, M protein 1.1 g, IgG 2241   Bone marrow biopsy 02/01/2020: Limited marrow aspirate at 2% plasma cells and core biopsy about 5% plasma cells.  Light chain in situ hybridization shows polytypic population.    I discussed with the patient that overall the M protein has been relatively steady.  Since the patient started Rybelsus he has lost about 22 pounds.  He is extremely happy about the weight  loss. Continue monitoring once a year   Orders Placed This Encounter  Procedures   CBC with Differential (Cancer Center Only)    Standing Status:   Future    Standing Expiration Date:   03/04/2024   CMP (Cancer Center only)    Standing Status:   Future    Standing Expiration Date:   03/04/2024   Beta 2 microglobulin, serum    Standing Status:   Future    Standing Expiration Date:   03/04/2024   Kappa/lambda light chains    Standing Status:   Future    Standing Expiration Date:   03/04/2024   Multiple Myeloma Panel (SPEP&IFE w/QIG)    Standing Status:   Future    Standing Expiration Date:   03/04/2024   The patient has a good understanding of the overall plan. he agrees with it. he will call with any problems that may develop before the next visit here. Total time spent: 30 mins including face to face time and time spent for planning, charting and co-ordination of care   Tamsen Meek, MD 03/05/23    I Janan Ridge am acting as a Neurosurgeon for [employer]  I have reviewed the above documentation for accuracy and completeness, and I agree with the above.

## 2023-03-11 ENCOUNTER — Telehealth: Payer: Self-pay | Admitting: *Deleted

## 2023-03-11 ENCOUNTER — Ambulatory Visit: Payer: Medicare Other | Attending: Cardiology | Admitting: Cardiology

## 2023-03-11 ENCOUNTER — Encounter: Payer: Self-pay | Admitting: Cardiology

## 2023-03-11 VITALS — BP 142/70 | HR 54 | Ht 73.0 in | Wt 330.0 lb

## 2023-03-11 DIAGNOSIS — I441 Atrioventricular block, second degree: Secondary | ICD-10-CM | POA: Diagnosis not present

## 2023-03-11 DIAGNOSIS — I4819 Other persistent atrial fibrillation: Secondary | ICD-10-CM

## 2023-03-11 DIAGNOSIS — G4733 Obstructive sleep apnea (adult) (pediatric): Secondary | ICD-10-CM | POA: Diagnosis not present

## 2023-03-11 DIAGNOSIS — I10 Essential (primary) hypertension: Secondary | ICD-10-CM

## 2023-03-11 DIAGNOSIS — Z7189 Other specified counseling: Secondary | ICD-10-CM | POA: Diagnosis not present

## 2023-03-11 NOTE — Patient Instructions (Signed)
Medication Instructions:  Your physician recommends that you continue on your current medications as directed. Please refer to the Current Medication list given to you today.  *If you need a refill on your cardiac medications before your next appointment, please call your pharmacy*   Lab Work: None.  If you have labs (blood work) drawn today and your tests are completely normal, you will receive your results only by: MyChart Message (if you have MyChart) OR A paper copy in the mail If you have any lab test that is abnormal or we need to change your treatment, we will call you to review the results.   Testing/Procedures: Your doctor has ordered a coronary calcium score CT. This is a test that can estimate your risk for atherosclerotic disease. Your cost will be between $94-99.    Follow-Up: At [facility], you and your health needs are our priority.  As part of our continuing mission to provide you with exceptional heart care, we have created designated Provider Care Teams.  These Care Teams include your primary Cardiologist (physician) and Advanced Practice Providers (APPs -  Physician Assistants and Nurse Practitioners) who all work together to provide you with the care you need, when you need it.  We recommend signing up for the patient portal called "MyChart".  Sign up information is provided on this After Visit Summary.  MyChart is used to connect with patients for Virtual Visits (Telemedicine).  Patients are able to view lab/test results, encounter notes, upcoming appointments, etc.  Non-urgent messages can be sent to your provider as well.   To learn more about what you can do with MyChart, go to [email or web address].    Your next appointment:   1 year(s)  Provider:   Armanda Magic, MD     Other Instructions Dr. Mayford Knife has sent orders for cpap/supplies to your DME company. Someone from our sleep team or from your DME company will contact you about delivery/pick up.

## 2023-03-11 NOTE — Progress Notes (Signed)
Date:  03/11/2023   ID:  Alec Hunt, DOB 1950-11-12, MRN [id number]   PCP:  Ileana Ladd, MD (Inactive)  Cardiologist:  Armanda Magic, MD  Electrophysiologist:  None   Chief Complaint:  OSA, PAF  History of Present Illness:    Alec Hunt is a 72 y.o. male with a hx of persistent atrial fibrillation, OSA on BiPAP, HTN, second degree type I Mobitz heart block, RBBB, history of syncope in setting of afib and morbid obesity.  His last echo showed normal LVF with EF 60-65% with G2DD and mildly dilated ascending aorta.   He is here today for followup and is doing well.  He denies any chest pain or pressure, SOB, DOE, PND, orthopnea, LE edema, dizziness, palpitations or syncope. He is compliant with his meds and is tolerating meds with no SE.     He is doing well with his PAP device and thinks that he has gotten used to it.  He tolerates the mask and feels the pressure is adequate.  Since going on PAP he feels rested in the am and has no significant daytime sleepiness.  He denies any significant mouth or nasal dryness or nasal congestion.  He does not think that he snores.     Prior CV studies:   The following studies were reviewed today:  PAP download, EKG  Past Medical History:  Diagnosis Date   Arthritis    kness & shoulders   Bradycardia    History of syncope 10/2013   in setting new dx atrial fib   Hypertension    Mobitz type 1 second degree atrioventricular block    Morbid obesity (HCC)    Nocturia    OSA treated with BiPAP    Severe w AHI 81.28/hr now on Bipap   Persistent atrial fibrillation [facility])    cardiologist-  dr Gloris Manchester Priti Consoli-- dx 2014   Prostate cancer [facility]) urologist-  dr Ronne Binning  oncologist-  dr Kathrynn Running   dx 06-11-2017--- Stage T1c, Gleason 4+5, PSA 7.65, vol 32.8cc--  scheduled for radiactive seed implants 11-12-2017   RBBB    Wears glasses    Past Surgical History:  Procedure Laterality Date   CARDIAC CATHETERIZATION  1990s  dr t. Mayford Knife    per pt told normal coronaries   CARDIOVASCULAR STRESS TEST  06/16/2012   Low risk nuclear study/  normal LV function and wall motion , ef 61%   CARDIOVASCULAR STRESS TEST  06/16/2012   Low risk nuclear study w/ no ischemia/ normal LV function and wall motion , ef 61%   COLONOSCOPY  2012 approx.   CYSTOSCOPY  11/12/2017   Procedure: CYSTOSCOPY FLEXIBLE;  Surgeon: Malen Gauze, MD;  Location: [facility];  Service: Urology;;   KNEE ARTHROSCOPY Right 1990s   LUMBAR LAMINECTOMY/DECOMPRESSION MICRODISCECTOMY N/A 09/27/2014   Procedure: LUMBAR LAMINECTOMY/DECOMPRESSION MICRODISCECTOMY 3 LEVELS L4-5 L3-4 POSSIBLE L2-3 BILATERAL FORAMINOTOMIES AT L-3,4 AND 5;  Surgeon: Jene Every, MD;  Location: [facility];  Service: Orthopedics;  Laterality: N/A;   PROSTATE BIOPSY  06-11-2017   dr Ronne Binning office   RADIOACTIVE SEED IMPLANT N/A 11/12/2017   Procedure: RADIOACTIVE SEED IMPLANT/BRACHYTHERAPY IMPLANT;  Surgeon: Malen Gauze, MD;  Location: [facility];  Service: Urology;  Laterality: N/A;   ROTATOR CUFF REPAIR Right 2015   SPACE OAR INSTILLATION N/A 11/12/2017   Procedure: SPACE OAR INSTILLATION;  Surgeon: Malen Gauze, MD;  Location: [facility];  Service: Urology;  Laterality: N/A;   TRANSTHORACIC ECHOCARDIOGRAM  05/26/2017  mild LVH, ef 60-65%, grade 2 diastolic dysfunction/  mild dilaged ascending aorta/  trivial PR and TR/  mild RAE   TRANSTHORACIC ECHOCARDIOGRAM     mild LVH, ef 60-65%, grade 2 diastoli dysfunction/ mild dilated ascending aorta/  trivil PR and TR/  mild RAE     Current Meds  Medication Sig   losartan (COZAAR) 100 MG tablet TAKE 1 TABLET (100 MG TOTAL) BY MOUTH IN THE MORNING. PLEASE CALL TO SCHEDULE AN OVERDUE APPOINTMENT WITH DR. Mayford Knife FOR REFILLS, [phone], THANK YOU. 1ST ATTEMPT.   rosuvastatin (CRESTOR) 10 MG tablet Take 10 mg by mouth daily.   RYBELSUS 7 MG TABS Take 1 tablet by mouth daily.   warfarin (COUMADIN) 5 MG tablet TAKE 1.5 TO 2 TABLETS DAILY BY MOUTH AS  DIRECTED BY COUMADIN CLINIC     Allergies:   Hydrochlorothiazide   Social History   Tobacco Use   Smoking status: Former    Current packs/day: 0.00    Average packs/day: 0.3 packs/day for 15.0 years (3.8 ttl pk-yrs)    Types: Cigarettes    Start date: 02/02/1985    Quit date: 02/03/2000    Years since quitting: 23.1   Smokeless tobacco: Never  Vaping Use   Vaping status: Never Used  Substance Use Topics   Alcohol use: Not Currently    Comment: hx of ETOH use quit at age 20 (38)   Drug use: No     Family Hx: The patient's family history includes CAD in his mother; Heart attack in his mother; Heart disease in his mother; Hypertension in his brother. There is no history of Cancer.  ROS:   Please see the history of present illness.     All other systems reviewed and are negative.   Labs/Other Tests and Data Reviewed:    Recent Labs: 02/26/2023: ALT 17; BUN 11; Creatinine 1.13; Hemoglobin 12.9; Platelet Count 214; Potassium 3.8; Sodium 138   Recent Lipid Panel No results found for: "CHOL", "TRIG", "HDL", "CHOLHDL", "LDLCALC", "LDLDIRECT"  Wt Readings from Last 3 Encounters:  03/11/23 (!) 330 lb (149.7 kg)  03/05/23 (!) 327 lb 4.8 oz (148.5 kg)  02/25/22 (!) 330 lb 11.2 oz (150 kg)     Objective:    Vital Signs:  BP (!) 142/70   Pulse (!) 54   Ht 6\' 1"  (1.854 m)   Wt (!) 330 lb (149.7 kg)   SpO2 98%   BMI 43.54 kg/m   GEN: Well nourished, well developed in no acute distress HEENT: Normal NECK: No JVD; No carotid bruits LYMPHATICS: No lymphadenopathy CARDIAC:RRR, no murmurs, rubs, gallops RESPIRATORY:  Clear to auscultation without rales, wheezing or rhonchi  ABDOMEN: Soft, non-tender, non-distended MUSCULOSKELETAL:  No edema; No deformity  SKIN: Warm and dry NEUROLOGIC:  Alert and oriented x 3 PSYCHIATRIC:  Normal affect    ASSESSMENT & PLAN:    1.  OSA - The patient is tolerating PAP therapy well without any problems. The PAP download performed by his  DME was personally reviewed and interpreted by me today and showed an AHI of 6.5 /hr on BiPAP at 21/15 cm H2O with 100 % compliance in using more than 4 hours nightly.  The patient has been using and benefiting from PAP use and will continue to benefit from therapy.  -his device is running out of water at night and he has to change to his chamber twice nightly>>he thinks is device is over 66 years old>>I will contact DME to find out and  if he qualifies I will order a new ResMEd BIPAP on 21/15cm H2O with heated humidity and mask of choice   2.  PAF -he remains in NSR with no palpitations -he has not had any bleeding issues on warfarin -Continue prescription drug management with warfarin followed in coumadin clinic -I have personally reviewed and interpreted outside labs performed by patient's PCP which showed Hbg 12.9 on 02/26/2023  3.  HTN -BP stable today -Continue prescription drug management with losartan 100 mg daily with as needed refills -I have personally reviewed and interpreted outside labs performed by patient's PCP which showed serum creatinine 1.13 and potassium 3.8 on 02/26/2023  4.  Mobitz I second degree AV block -asymptomatic with no dizziness or syncope  5.  Cardiac Risk Factors -he has several risk factors for CAD including HTN, obesity -he is asymptomatic from a cardiac standpoint -check coronary Ca score to assess future risk   Medication Adjustments/Labs and Tests Ordered: Current medicines are reviewed at length with the patient today.  Concerns regarding medicines are outlined above.  Tests Ordered: Orders Placed This Encounter  Procedures   EKG 12-Lead    Medication Changes: No orders of the defined types were placed in this encounter.    Disposition:  Follow up in 1 year(s)  Signed, Armanda Magic, MD  03/11/2023 10:12 AM    [facility]

## 2023-03-11 NOTE — Telephone Encounter (Signed)
-----   Message from Armanda Magic sent at 03/11/2023 10:14 AM EDT ----- his device is running out of water at night and he has to change to his chamber twice nightly>>he thinks is device is over 73 years old>>I will contact DME to find out and if he qualifies I will order a new ResMEd BIPAP on 21/15cm H2O with heated humidity and mask of choice

## 2023-03-11 NOTE — Telephone Encounter (Signed)
[employer] says he got set up 05/11/2019 and he is not eligible for a new device until 05/10/2024.

## 2023-03-11 NOTE — Addendum Note (Signed)
Addended by: Luellen Pucker on: 03/11/2023 10:23 AM   Modules accepted: Orders

## 2023-03-20 ENCOUNTER — Ambulatory Visit: Payer: Medicare Other | Attending: Cardiology

## 2023-03-20 DIAGNOSIS — I4819 Other persistent atrial fibrillation: Secondary | ICD-10-CM

## 2023-03-20 DIAGNOSIS — Z5181 Encounter for therapeutic drug level monitoring: Secondary | ICD-10-CM | POA: Diagnosis not present

## 2023-03-20 LAB — POCT INR: INR: 2.7 (ref 2.0–3.0)

## 2023-03-20 NOTE — Patient Instructions (Signed)
Continue 2 tablets daily except for 1.5 tablets on Mondays and Wednesdays.  Remain consistent with greens each week.  Recheck INR in 6 weeks. Coumadin Clinic [phone]

## 2023-03-25 ENCOUNTER — Other Ambulatory Visit: Payer: Self-pay | Admitting: Cardiology

## 2023-04-02 ENCOUNTER — Encounter: Payer: Self-pay | Admitting: Cardiology

## 2023-04-02 ENCOUNTER — Ambulatory Visit (HOSPITAL_BASED_OUTPATIENT_CLINIC_OR_DEPARTMENT_OTHER)
Admission: RE | Admit: 2023-04-02 | Discharge: 2023-04-02 | Disposition: A | Discharge status: Home / Self Care | Payer: Medicare Other | Source: Ambulatory Visit | Attending: Cardiology | Admitting: Cardiology

## 2023-04-02 DIAGNOSIS — Z7189 Other specified counseling: Secondary | ICD-10-CM | POA: Insufficient documentation

## 2023-04-02 DIAGNOSIS — I77810 Thoracic aortic ectasia: Secondary | ICD-10-CM | POA: Insufficient documentation

## 2023-04-02 DIAGNOSIS — I700 Atherosclerosis of aorta: Secondary | ICD-10-CM | POA: Insufficient documentation

## 2023-04-02 DIAGNOSIS — R931 Abnormal findings on diagnostic imaging of heart and coronary circulation: Secondary | ICD-10-CM | POA: Insufficient documentation

## 2023-04-07 ENCOUNTER — Telehealth: Payer: Self-pay

## 2023-04-07 DIAGNOSIS — E785 Hyperlipidemia, unspecified: Secondary | ICD-10-CM

## 2023-04-07 DIAGNOSIS — Z79899 Other long term (current) drug therapy: Secondary | ICD-10-CM

## 2023-04-07 DIAGNOSIS — Z01812 Encounter for preprocedural laboratory examination: Secondary | ICD-10-CM

## 2023-04-07 DIAGNOSIS — I77810 Thoracic aortic ectasia: Secondary | ICD-10-CM

## 2023-04-07 DIAGNOSIS — I700 Atherosclerosis of aorta: Secondary | ICD-10-CM

## 2023-04-07 NOTE — Telephone Encounter (Signed)
Call to patient to discuss coronary calcium score elevated at 365 which means he does have some coronary plaque. Also discussed has plaque in the aorta which is also mildly enlarged at 43 mm.  Dr. Mayford Knife recommends a chest CT angio to assess aortic size further patient verbalizes understanding and agrees to plan.  Patient scheduled for a fasting lipid panel and LP(a). Chest Ct angion ordered.

## 2023-04-07 NOTE — Telephone Encounter (Signed)
-----   Message from Armanda Magic sent at 04/02/2023 12:33 PM EDT ----- Coronary calcium score elevated at 365 which means he does have some coronary plaque.  He also has aortic atherosclerosis which is plaque in the aorta.  The aorta is mildly enlarged at 43 mm this was a noncontrasted study.  I would like him to have a chest CT angio to assess aortic size further.  Need to bmet prior to this.  Please have him come in for a fasting lipid panel and LP(a).  No aspirin since he is on warfarin

## 2023-04-13 DIAGNOSIS — I10 Essential (primary) hypertension: Secondary | ICD-10-CM | POA: Diagnosis not present

## 2023-04-13 DIAGNOSIS — Z0000 Encounter for general adult medical examination without abnormal findings: Secondary | ICD-10-CM | POA: Diagnosis not present

## 2023-04-13 DIAGNOSIS — E1169 Type 2 diabetes mellitus with other specified complication: Secondary | ICD-10-CM | POA: Diagnosis not present

## 2023-04-13 DIAGNOSIS — E119 Type 2 diabetes mellitus without complications: Secondary | ICD-10-CM | POA: Diagnosis not present

## 2023-04-13 DIAGNOSIS — I700 Atherosclerosis of aorta: Secondary | ICD-10-CM | POA: Diagnosis not present

## 2023-04-13 DIAGNOSIS — I480 Paroxysmal atrial fibrillation: Secondary | ICD-10-CM | POA: Diagnosis not present

## 2023-04-13 DIAGNOSIS — E785 Hyperlipidemia, unspecified: Secondary | ICD-10-CM | POA: Diagnosis not present

## 2023-04-13 DIAGNOSIS — D6869 Other thrombophilia: Secondary | ICD-10-CM | POA: Diagnosis not present

## 2023-04-13 DIAGNOSIS — D638 Anemia in other chronic diseases classified elsewhere: Secondary | ICD-10-CM | POA: Diagnosis not present

## 2023-04-14 ENCOUNTER — Ambulatory Visit: Payer: Medicare Other | Attending: Cardiology

## 2023-04-14 DIAGNOSIS — Z01812 Encounter for preprocedural laboratory examination: Secondary | ICD-10-CM

## 2023-04-14 DIAGNOSIS — E785 Hyperlipidemia, unspecified: Secondary | ICD-10-CM | POA: Diagnosis not present

## 2023-04-14 DIAGNOSIS — Z79899 Other long term (current) drug therapy: Secondary | ICD-10-CM | POA: Diagnosis not present

## 2023-04-15 LAB — LIPID PANEL
Chol/HDL Ratio: 1.9 {ratio} (ref 0.0–5.0)
Cholesterol, Total: 79 mg/dL — ABNORMAL LOW (ref 100–199)
HDL: 41 mg/dL (ref >39)
LDL Chol Calc (NIH): 25 mg/dL (ref 0–99)
Triglycerides: 54 mg/dL (ref 0–149)
VLDL Cholesterol Cal: 13 mg/dL (ref 5–40)

## 2023-04-15 LAB — BASIC METABOLIC PANEL
BUN/Creatinine Ratio: 10 (ref 10–24)
BUN: 10 mg/dL (ref 8–27)
CO2: 22 mmol/L (ref 20–29)
Calcium: 9.4 mg/dL (ref 8.6–10.2)
Chloride: 104 mmol/L (ref 96–106)
Creatinine, Ser: 1.04 mg/dL (ref 0.76–1.27)
Glucose: 109 mg/dL — ABNORMAL HIGH (ref 70–99)
Potassium: 4.3 mmol/L (ref 3.5–5.2)
Sodium: 139 mmol/L (ref 134–144)
eGFR: 76 mL/min/{1.73_m2} (ref >59)

## 2023-04-15 LAB — LIPOPROTEIN A (LPA): Lipoprotein (a): 35.2 nmol/L (ref <75.0)

## 2023-04-16 ENCOUNTER — Ambulatory Visit (HOSPITAL_BASED_OUTPATIENT_CLINIC_OR_DEPARTMENT_OTHER)
Admission: RE | Admit: 2023-04-16 | Discharge: 2023-04-16 | Disposition: A | Discharge status: Home / Self Care | Payer: Medicare Other | Source: Ambulatory Visit | Attending: Cardiology | Admitting: Cardiology

## 2023-04-16 ENCOUNTER — Telehealth: Payer: Self-pay

## 2023-04-16 DIAGNOSIS — I77810 Thoracic aortic ectasia: Secondary | ICD-10-CM | POA: Diagnosis not present

## 2023-04-16 DIAGNOSIS — R911 Solitary pulmonary nodule: Secondary | ICD-10-CM | POA: Diagnosis not present

## 2023-04-16 DIAGNOSIS — I700 Atherosclerosis of aorta: Secondary | ICD-10-CM | POA: Diagnosis not present

## 2023-04-16 DIAGNOSIS — I2510 Atherosclerotic heart disease of native coronary artery without angina pectoris: Secondary | ICD-10-CM | POA: Diagnosis not present

## 2023-04-16 DIAGNOSIS — K760 Fatty (change of) liver, not elsewhere classified: Secondary | ICD-10-CM | POA: Diagnosis not present

## 2023-04-16 MED ORDER — IOHEXOL 350 MG/ML SOLN
100.0000 mL | Freq: Once | INTRAVENOUS | Status: AC | PRN
  Administered 2023-04-16: 80 mL via INTRAVENOUS

## 2023-04-16 NOTE — Telephone Encounter (Signed)
Call to patient to review labs, patient verbalizes understanding of normal results.

## 2023-04-16 NOTE — Telephone Encounter (Signed)
-----   Message from Armanda Magic sent at 04/15/2023  7:18 AM EDT ----- Please let patient know that labs were normal.  Continue current medical therapy.

## 2023-04-20 ENCOUNTER — Telehealth: Payer: Self-pay

## 2023-04-20 NOTE — Telephone Encounter (Signed)
-----   Message from [facility] sent at 04/20/2023  9:28 AM EDT -----  ----- Message ----- From: Quintella Reichert, MD Sent: 04/20/2023   8:05 AM EDT To: Soundra Pilon, FNP; [facility] Triage  Non cardiac portion of coronary Ca score showed cholesterol plaque in the aorta called aortic atherosclerosis and a right middle lobe pulmonary nodule likely benign - will forward to PCP to followup on.

## 2023-04-20 NOTE — Telephone Encounter (Signed)
Call to patient to discuss non cardiac portion of coronary Ca score which showed cholesterol plaque in the aorta called aortic atherosclerosis and a right middle lobe pulmonary nodule likely benign. Patient verbalizes understanding, forwarded to PCP to followup on.

## 2023-04-23 ENCOUNTER — Telehealth: Payer: Self-pay

## 2023-04-23 DIAGNOSIS — I359 Nonrheumatic aortic valve disorder, unspecified: Secondary | ICD-10-CM

## 2023-04-23 NOTE — Telephone Encounter (Signed)
-----   Message from Armanda Magic sent at 04/20/2023  6:28 PM EDT ----- Chest CTA in agreement with Coronary calcium score showing upper normal limits of size of ascending aorta at 4.3cm for his BSA, plaque in the aorta called aortic atherosclerosis with coronary artery calcifications, aortic valve calcifications and 4.69mm nodule in RML>>please get 2D echo to assess AV calcifications

## 2023-04-23 NOTE — Telephone Encounter (Signed)
Call to patient to discuss results, patient verbalizes understanding that Chest CTA in agreement with Coronary calcium score showing upper normal limits of size of ascending aorta at 4.3cm for his BSA, plaque in the aorta called aortic atherosclerosis with coronary artery calcifications. Exoplained aortic valve calcifications and 4.49mm nodule in RML, which patient is already aware of. Patient agrees to 2D echo to assess AV calcifications, order placed.

## 2023-05-01 ENCOUNTER — Ambulatory Visit: Payer: Medicare Other | Attending: Cardiovascular Disease

## 2023-05-01 DIAGNOSIS — Z5181 Encounter for therapeutic drug level monitoring: Secondary | ICD-10-CM | POA: Diagnosis not present

## 2023-05-01 DIAGNOSIS — I4819 Other persistent atrial fibrillation: Secondary | ICD-10-CM | POA: Diagnosis not present

## 2023-05-01 LAB — POCT INR: INR: 2.1 (ref 2.0–3.0)

## 2023-05-01 NOTE — Patient Instructions (Signed)
Continue 2 tablets daily except for 1.5 tablets on Mondays and Wednesdays.  Remain consistent with greens each week.  Recheck INR in 6 weeks. [facility] [phone]

## 2023-05-05 ENCOUNTER — Ambulatory Visit (HOSPITAL_COMMUNITY): Payer: Medicare Other | Attending: Cardiology

## 2023-05-05 ENCOUNTER — Encounter: Payer: Self-pay | Admitting: Cardiology

## 2023-05-05 DIAGNOSIS — I359 Nonrheumatic aortic valve disorder, unspecified: Secondary | ICD-10-CM | POA: Insufficient documentation

## 2023-05-05 LAB — ECHOCARDIOGRAM COMPLETE
Area-P 1/2: 4.54 cm2
S' Lateral: 2.9 cm

## 2023-05-07 ENCOUNTER — Telehealth: Payer: Self-pay

## 2023-05-07 NOTE — Telephone Encounter (Signed)
Call to patient to discuss echo results, no answer. Left detailed message per DPR asking patient to call the office.

## 2023-05-07 NOTE — Telephone Encounter (Signed)
-----   Message from Armanda Magic sent at 05/05/2023  7:52 PM EST ----- Echo showed normal pumping function of heart muscle with mildly thickening of the heart muscle related to HTN, mildly enlarged atria which are the upper chambers of the heart.  The AV is mildly calcified.  There appears to be a PFO present.  This is a small hole between the 2 uppers chambers of the heart that closes off when you are born but in some people it does not completely seal off.  The only risk is a very remote risk of CVA if he develops a blood clot in his legs that travel to the heart and travel across the hole.  He actually is protected from this due to his chronic anticoagulation for afib.

## 2023-05-08 NOTE — Telephone Encounter (Signed)
Spoke with patient and discussed echo results.  Patient reports his PCP prescribed him amlodipine 2.5mg  daily for 1 month to help manage BP. Medication list updated.  Patient verbalized understanding of echo results and expressed appreciation for follow-up.

## 2023-05-08 NOTE — Addendum Note (Signed)
Addended by: Franchot Gallo on: 05/08/2023 04:41 PM   Modules accepted: Orders

## 2023-05-08 NOTE — Telephone Encounter (Signed)
Patient was returning call. Please advise ?

## 2023-05-21 ENCOUNTER — Other Ambulatory Visit (HOSPITAL_COMMUNITY): Payer: Medicare Other

## 2023-06-12 ENCOUNTER — Ambulatory Visit: Payer: Medicare Other | Attending: Cardiology

## 2023-06-12 DIAGNOSIS — Z5181 Encounter for therapeutic drug level monitoring: Secondary | ICD-10-CM

## 2023-06-12 DIAGNOSIS — I4819 Other persistent atrial fibrillation: Secondary | ICD-10-CM

## 2023-06-12 LAB — POCT INR: INR: 2.2 (ref 2.0–3.0)

## 2023-06-12 NOTE — Patient Instructions (Signed)
Description   Continue 2 tablets daily except for 1.5 tablets on Mondays and Wednesdays.  Remain consistent with greens each week.  Recheck INR in 6 weeks. Coumadin Clinic [phone]

## 2023-07-04 DIAGNOSIS — E119 Type 2 diabetes mellitus without complications: Secondary | ICD-10-CM | POA: Diagnosis not present

## 2023-07-20 DIAGNOSIS — H40052 Ocular hypertension, left eye: Secondary | ICD-10-CM | POA: Diagnosis not present

## 2023-07-20 DIAGNOSIS — D23121 Other benign neoplasm of skin of left upper eyelid, including canthus: Secondary | ICD-10-CM | POA: Diagnosis not present

## 2023-07-24 ENCOUNTER — Ambulatory Visit: Payer: Medicare Other | Attending: Internal Medicine

## 2023-07-24 DIAGNOSIS — I4819 Other persistent atrial fibrillation: Secondary | ICD-10-CM | POA: Diagnosis not present

## 2023-07-24 DIAGNOSIS — Z5181 Encounter for therapeutic drug level monitoring: Secondary | ICD-10-CM | POA: Diagnosis not present

## 2023-07-24 LAB — POCT INR: INR: 2.7 (ref 2.0–3.0)

## 2023-07-24 NOTE — Patient Instructions (Signed)
Description   Continue 2 tablets daily except for 1.5 tablets on Mondays and Wednesdays.  Remain consistent with greens each week.  Recheck INR in 6 weeks. Coumadin Clinic [phone]

## 2023-09-04 ENCOUNTER — Ambulatory Visit: Payer: Medicare Other

## 2023-09-10 ENCOUNTER — Ambulatory Visit: Attending: Cardiology | Admitting: *Deleted

## 2023-09-10 DIAGNOSIS — I4819 Other persistent atrial fibrillation: Secondary | ICD-10-CM

## 2023-09-10 DIAGNOSIS — Z5181 Encounter for therapeutic drug level monitoring: Secondary | ICD-10-CM

## 2023-09-10 LAB — POCT INR: POC INR: 3

## 2023-09-10 NOTE — Patient Instructions (Signed)
 Description   Continue 2 tablets daily except for 1.5 tablets on Mondays and Wednesdays.  Remain consistent with greens each week.  Recheck INR in 6 weeks. Coumadin Clinic [phone]

## 2023-10-20 DIAGNOSIS — E785 Hyperlipidemia, unspecified: Secondary | ICD-10-CM | POA: Diagnosis not present

## 2023-10-20 DIAGNOSIS — I10 Essential (primary) hypertension: Secondary | ICD-10-CM | POA: Diagnosis not present

## 2023-10-20 DIAGNOSIS — E1169 Type 2 diabetes mellitus with other specified complication: Secondary | ICD-10-CM | POA: Diagnosis not present

## 2023-10-20 DIAGNOSIS — I700 Atherosclerosis of aorta: Secondary | ICD-10-CM | POA: Diagnosis not present

## 2023-10-20 DIAGNOSIS — I480 Paroxysmal atrial fibrillation: Secondary | ICD-10-CM | POA: Diagnosis not present

## 2023-10-20 DIAGNOSIS — I441 Atrioventricular block, second degree: Secondary | ICD-10-CM | POA: Diagnosis not present

## 2023-10-22 ENCOUNTER — Ambulatory Visit: Attending: Cardiology | Admitting: *Deleted

## 2023-10-22 DIAGNOSIS — Z5181 Encounter for therapeutic drug level monitoring: Secondary | ICD-10-CM | POA: Diagnosis not present

## 2023-10-22 DIAGNOSIS — I4819 Other persistent atrial fibrillation: Secondary | ICD-10-CM | POA: Diagnosis not present

## 2023-10-22 LAB — POCT INR: POC INR: 2.3

## 2023-10-22 NOTE — Patient Instructions (Addendum)
 Description   Continue 2 tablets daily except for 1.5 tablets on Mondays and Wednesdays.  Remain consistent with greens each week.  Recheck INR in 6 weeks. Coumadin  Clinic [phone]       1st Floor: - Lobby - Registration  - Pharmacy  - Lab - Cafe  2nd Floor: - PV Lab - Diagnostic Testing (echo, CT, nuclear med)  3rd Floor: - Vacant  4th Floor: - TCTS (cardiothoracic surgery) - AFib Clinic - Structural Heart Clinic - Vascular Surgery  - Vascular Ultrasound  5th Floor: - HeartCare Cardiology (general and EP) - Clinical Pharmacy for coumadin , hypertension, lipid, weight-loss medications, and med management appointments    Valet parking services will be available as well.

## 2023-12-03 ENCOUNTER — Ambulatory Visit: Attending: Cardiology | Admitting: *Deleted

## 2023-12-03 ENCOUNTER — Telehealth: Payer: Self-pay | Admitting: *Deleted

## 2023-12-03 DIAGNOSIS — I4819 Other persistent atrial fibrillation: Secondary | ICD-10-CM | POA: Diagnosis not present

## 2023-12-03 DIAGNOSIS — Z5181 Encounter for therapeutic drug level monitoring: Secondary | ICD-10-CM | POA: Diagnosis not present

## 2023-12-03 LAB — POCT INR: POC INR: 3.4

## 2023-12-03 NOTE — Telephone Encounter (Signed)
 Pt came to appointment today to have INR checked. At appointment pt stated that he needs supplies for his CPAP machine. Pt is wondering where the supplies needs to be ordered from.

## 2023-12-03 NOTE — Patient Instructions (Signed)
 Description   Hold warfarin today then continue 2 tablets daily except for 1.5 tablets on Mondays and Wednesdays.  Remain consistent with greens each week.  Recheck INR in 4 weeks. Coumadin  Clinic [phone]

## 2023-12-04 ENCOUNTER — Telehealth: Payer: Self-pay

## 2023-12-04 DIAGNOSIS — G4733 Obstructive sleep apnea (adult) (pediatric): Secondary | ICD-10-CM

## 2023-12-04 DIAGNOSIS — I441 Atrioventricular block, second degree: Secondary | ICD-10-CM

## 2023-12-04 DIAGNOSIS — I4891 Unspecified atrial fibrillation: Secondary | ICD-10-CM

## 2023-12-04 DIAGNOSIS — I4819 Other persistent atrial fibrillation: Secondary | ICD-10-CM

## 2023-12-04 DIAGNOSIS — I10 Essential (primary) hypertension: Secondary | ICD-10-CM

## 2023-12-04 DIAGNOSIS — I359 Nonrheumatic aortic valve disorder, unspecified: Secondary | ICD-10-CM

## 2023-12-04 DIAGNOSIS — I700 Atherosclerosis of aorta: Secondary | ICD-10-CM

## 2023-12-04 DIAGNOSIS — E785 Hyperlipidemia, unspecified: Secondary | ICD-10-CM

## 2023-12-04 NOTE — Telephone Encounter (Signed)
 Order for CPAP supplies sent to [facility] today.

## 2023-12-28 ENCOUNTER — Ambulatory Visit: Attending: Cardiology

## 2023-12-28 DIAGNOSIS — I4819 Other persistent atrial fibrillation: Secondary | ICD-10-CM | POA: Diagnosis not present

## 2023-12-28 DIAGNOSIS — Z5181 Encounter for therapeutic drug level monitoring: Secondary | ICD-10-CM

## 2023-12-28 LAB — POCT INR: INR: 3.7 — AB (ref 2.0–3.0)

## 2023-12-28 NOTE — Progress Notes (Signed)
Please see anticoagulation encounter.

## 2023-12-28 NOTE — Patient Instructions (Signed)
 Hold warfarin today then continue 2 tablets daily except for 1.5 tablets on Mondays and Wednesdays.  Remain consistent with greens each week.  Recheck INR in 4 weeks. Coumadin  Clinic [phone]

## 2024-01-18 ENCOUNTER — Other Ambulatory Visit: Payer: Self-pay

## 2024-01-18 ENCOUNTER — Telehealth: Payer: Self-pay | Admitting: Cardiology

## 2024-01-18 DIAGNOSIS — I4819 Other persistent atrial fibrillation: Secondary | ICD-10-CM

## 2024-01-18 MED ORDER — WARFARIN SODIUM 5 MG PO TABS: ORAL_TABLET | ORAL | 1 refills | Status: DC

## 2024-01-18 NOTE — Telephone Encounter (Signed)
 What problem are you experiencing? Company he was using for supplies sent him a letter and said they will not be sending out any more supplies  Who is your medical equipment company? Doesn't know  3)    If patient is calling about their sleep study results please route to CV DIV Sleep Study Pool.   Please route to the sleep study coordinator.

## 2024-01-18 NOTE — Telephone Encounter (Signed)
*  STAT* If patient is at the pharmacy, call can be transferred to refill team.   1. Which medications need to be refilled? (please list name of each medication and dose if known)  warfarin (COUMADIN ) 5 MG tablet  2. Which pharmacy/location (including street and city if local pharmacy) is medication to be sent to? [id number] [place], [place] - [address] Phone: [phone]  Fax: [phone]     3. Do they need a 30 day or 90 day supply? 90

## 2024-01-25 ENCOUNTER — Telehealth: Payer: Self-pay | Admitting: Cardiology

## 2024-01-25 ENCOUNTER — Ambulatory Visit: Attending: Cardiology

## 2024-01-25 DIAGNOSIS — I4819 Other persistent atrial fibrillation: Secondary | ICD-10-CM | POA: Diagnosis not present

## 2024-01-25 DIAGNOSIS — G4733 Obstructive sleep apnea (adult) (pediatric): Secondary | ICD-10-CM

## 2024-01-25 DIAGNOSIS — Z5181 Encounter for therapeutic drug level monitoring: Secondary | ICD-10-CM | POA: Diagnosis not present

## 2024-01-25 DIAGNOSIS — I10 Essential (primary) hypertension: Secondary | ICD-10-CM

## 2024-01-25 LAB — POCT INR: INR: 3.2 — AB (ref 2.0–3.0)

## 2024-01-25 NOTE — Telephone Encounter (Signed)
 Order placed to [employer] [facility] for Cpap Supplies   Mask-headgear-cushions-filters-heated tubing-water  chamber

## 2024-01-25 NOTE — Telephone Encounter (Signed)
 Patient Is needing refills on c-pap supplies

## 2024-01-25 NOTE — Patient Instructions (Signed)
 continue 2 tablets daily except for 1.5 tablets on Mondays and Wednesdays. Eat greens tonight. Remain consistent with greens each week.  Recheck INR in 4 weeks. Coumadin  Clinic [phone]

## 2024-01-25 NOTE — Progress Notes (Signed)
 INR 3.2. Please see anticoagulation encounter

## 2024-02-22 ENCOUNTER — Ambulatory Visit: Attending: Cardiology

## 2024-02-22 DIAGNOSIS — Z5181 Encounter for therapeutic drug level monitoring: Secondary | ICD-10-CM

## 2024-02-22 DIAGNOSIS — I4819 Other persistent atrial fibrillation: Secondary | ICD-10-CM

## 2024-02-22 LAB — POCT INR: INR: 2.4 (ref 2.0–3.0)

## 2024-02-22 NOTE — Progress Notes (Signed)
 INR 2.4 Please see anticoagulation encounter continue 2 tablets daily except for 1.5 tablets on Mondays and Wednesdays.  Remain consistent with greens each week.  Recheck INR in 6 weeks. Coumadin  Clinic [phone]

## 2024-02-22 NOTE — Patient Instructions (Signed)
 continue 2 tablets daily except for 1.5 tablets on Mondays and Wednesdays.  Remain consistent with greens each week.  Recheck INR in 6 weeks. Coumadin  Clinic [phone]

## 2024-03-06 ENCOUNTER — Other Ambulatory Visit: Payer: Self-pay | Admitting: Cardiology

## 2024-03-31 ENCOUNTER — Other Ambulatory Visit: Payer: Self-pay | Admitting: Cardiology

## 2024-04-04 ENCOUNTER — Ambulatory Visit

## 2024-04-05 ENCOUNTER — Ambulatory Visit: Attending: Cardiology | Admitting: Pharmacist

## 2024-04-05 DIAGNOSIS — I4819 Other persistent atrial fibrillation: Secondary | ICD-10-CM | POA: Diagnosis not present

## 2024-04-05 DIAGNOSIS — Z5181 Encounter for therapeutic drug level monitoring: Secondary | ICD-10-CM

## 2024-04-05 LAB — POCT INR: INR: 2.8 (ref 2.0–3.0)

## 2024-04-05 NOTE — Progress Notes (Signed)
 Description   INR 2.8: continue 2 tablets daily except for 1.5 tablets on Mondays and Wednesdays.  Remain consistent with greens each week.  Recheck INR in 6 weeks. Coumadin  Clinic [phone]

## 2024-04-05 NOTE — Patient Instructions (Signed)
 Description   INR 2.8: continue 2 tablets daily except for 1.5 tablets on Mondays and Wednesdays.  Remain consistent with greens each week.  Recheck INR in 6 weeks. Coumadin  Clinic [phone]

## 2024-04-21 ENCOUNTER — Ambulatory Visit: Admitting: Cardiology

## 2024-04-23 ENCOUNTER — Other Ambulatory Visit: Payer: Self-pay | Admitting: Cardiology

## 2024-04-25 DIAGNOSIS — D6869 Other thrombophilia: Secondary | ICD-10-CM | POA: Diagnosis not present

## 2024-04-25 DIAGNOSIS — Z0000 Encounter for general adult medical examination without abnormal findings: Secondary | ICD-10-CM | POA: Diagnosis not present

## 2024-04-25 DIAGNOSIS — D638 Anemia in other chronic diseases classified elsewhere: Secondary | ICD-10-CM | POA: Diagnosis not present

## 2024-04-25 DIAGNOSIS — E785 Hyperlipidemia, unspecified: Secondary | ICD-10-CM | POA: Diagnosis not present

## 2024-04-25 DIAGNOSIS — E1169 Type 2 diabetes mellitus with other specified complication: Secondary | ICD-10-CM | POA: Diagnosis not present

## 2024-04-25 DIAGNOSIS — I10 Essential (primary) hypertension: Secondary | ICD-10-CM | POA: Diagnosis not present

## 2024-04-25 DIAGNOSIS — I700 Atherosclerosis of aorta: Secondary | ICD-10-CM | POA: Diagnosis not present

## 2024-04-25 DIAGNOSIS — I480 Paroxysmal atrial fibrillation: Secondary | ICD-10-CM | POA: Diagnosis not present

## 2024-04-25 DIAGNOSIS — Z23 Encounter for immunization: Secondary | ICD-10-CM | POA: Diagnosis not present

## 2024-05-17 ENCOUNTER — Ambulatory Visit: Attending: Cardiology

## 2024-05-17 DIAGNOSIS — I4819 Other persistent atrial fibrillation: Secondary | ICD-10-CM | POA: Diagnosis not present

## 2024-05-17 DIAGNOSIS — Z5181 Encounter for therapeutic drug level monitoring: Secondary | ICD-10-CM

## 2024-05-17 LAB — POCT INR: INR: 2.8 (ref 2.0–3.0)

## 2024-05-17 NOTE — Patient Instructions (Signed)
 Description   INR 2.8: Continue 2 tablets daily except for 1.5 tablets on Mondays and Wednesdays.  Remain consistent with greens each week.  Recheck INR in 6 weeks. Coumadin  Clinic [phone]

## 2024-05-17 NOTE — Progress Notes (Signed)
 Description   INR 2.8: Continue 2 tablets daily except for 1.5 tablets on Mondays and Wednesdays.  Remain consistent with greens each week.  Recheck INR in 6 weeks. Coumadin  Clinic [phone]

## 2024-06-28 ENCOUNTER — Ambulatory Visit: Attending: Cardiology | Admitting: Pharmacist

## 2024-06-28 DIAGNOSIS — Z5181 Encounter for therapeutic drug level monitoring: Secondary | ICD-10-CM | POA: Diagnosis not present

## 2024-06-28 DIAGNOSIS — I4819 Other persistent atrial fibrillation: Secondary | ICD-10-CM

## 2024-06-28 LAB — POCT INR: INR: 2.8 (ref 2.0–3.0)

## 2024-06-28 NOTE — Patient Instructions (Signed)
 Description   INR 2.8: Continue 2 tablets daily except for 1.5 tablets on Mondays and Wednesdays.  Remain consistent with greens each week.  Recheck INR in 6 weeks. Coumadin  Clinic [phone]

## 2024-06-28 NOTE — Progress Notes (Signed)
 Description   INR 2.8: Continue 2 tablets daily except for 1.5 tablets on Mondays and Wednesdays.  Remain consistent with greens each week.  Recheck INR in 6 weeks. Coumadin  Clinic [phone]

## 2024-07-07 ENCOUNTER — Telehealth: Payer: Self-pay

## 2024-07-07 NOTE — Telephone Encounter (Signed)
 Alec Hunt

## 2024-07-08 ENCOUNTER — Encounter: Payer: Self-pay | Admitting: Cardiology

## 2024-07-08 ENCOUNTER — Ambulatory Visit: Attending: Cardiology | Admitting: Cardiology

## 2024-07-08 VITALS — BP 162/70 | HR 53 | Ht 73.0 in | Wt 330.0 lb

## 2024-07-08 DIAGNOSIS — G4733 Obstructive sleep apnea (adult) (pediatric): Secondary | ICD-10-CM

## 2024-07-08 DIAGNOSIS — R931 Abnormal findings on diagnostic imaging of heart and coronary circulation: Secondary | ICD-10-CM

## 2024-07-08 DIAGNOSIS — I441 Atrioventricular block, second degree: Secondary | ICD-10-CM | POA: Diagnosis not present

## 2024-07-08 DIAGNOSIS — I10 Essential (primary) hypertension: Secondary | ICD-10-CM

## 2024-07-08 DIAGNOSIS — I4819 Other persistent atrial fibrillation: Secondary | ICD-10-CM

## 2024-07-08 DIAGNOSIS — Z01812 Encounter for preprocedural laboratory examination: Secondary | ICD-10-CM | POA: Diagnosis not present

## 2024-07-08 DIAGNOSIS — I77810 Thoracic aortic ectasia: Secondary | ICD-10-CM

## 2024-07-08 DIAGNOSIS — I4510 Unspecified right bundle-branch block: Secondary | ICD-10-CM | POA: Diagnosis not present

## 2024-07-08 DIAGNOSIS — I700 Atherosclerosis of aorta: Secondary | ICD-10-CM

## 2024-07-08 DIAGNOSIS — R0600 Dyspnea, unspecified: Secondary | ICD-10-CM | POA: Diagnosis not present

## 2024-07-08 LAB — BASIC METABOLIC PANEL WITH GFR
BUN/Creatinine Ratio: 10 (ref 10–24)
BUN: 11 mg/dL (ref 8–27)
CO2: 21 mmol/L (ref 20–29)
Calcium: 9.6 mg/dL (ref 8.6–10.2)
Chloride: 103 mmol/L (ref 96–106)
Creatinine, Ser: 1.05 mg/dL (ref 0.76–1.27)
Glucose: 111 mg/dL — ABNORMAL HIGH (ref 70–99)
Potassium: 4.4 mmol/L (ref 3.5–5.2)
Sodium: 139 mmol/L (ref 134–144)
eGFR: 74 mL/min/1.73

## 2024-07-08 NOTE — Progress Notes (Signed)
 "  Date:  07/08/2024   ID:  Alec Hunt, DOB 10-16-1950, MRN [id number]   PCP:  Marvene Prentice SAUNDERS, FNP  Cardiologist:  Wilbert Bihari, MD  Electrophysiologist:  None   Chief Complaint:  OSA, PAF  History of Present Illness:    Alec Hunt is a 74 y.o. male with a hx of persistent atrial fibrillation, OSA on BiPAP, HTN, second degree type I Mobitz heart block, RBBB, history of syncope in setting of afib and morbid obesity.  His last echo showed normal LVF with EF 60-65% with G2DD and mildly dilated ascending aorta.   He is here today and is doing well.  He denies any chest pain or pressure, SOB, DOE, PND, orthopnea, l dizziness, palpitations or syncope.  He occasionally has some mild LE edema. He tells me that he uses a push lawn mower and has to sit down multiple times due to SOB and diaphoresis so stopped doing it.  He is doing well with his PAP device.  He tolerates the full face mask but feels like the large is too small. He feels the pressure is fine.  Since going on PAP he feels rested in the am and has no significant daytime sleepiness.  He denies any significant mouth or nasal dryness or nasal congestion.  He does not think that he snores. Patient denies any episodes of bruxism, restless legs, hypnogognic hallucinations or cataplectic events.    Prior CV studies:   The following studies were reviewed today:  PAP download, EKG  Past Medical History:  Diagnosis Date   Agatston coronary artery calcium score between 200 and 399    coronary calcium score 365 on 03/2023   Aortic atherosclerosis    Arthritis    kness & shoulders   Ascending aorta dilatation    43 mm on noncontrasted CT 03/2023   Bradycardia    History of syncope 10/2013   in setting new dx atrial fib   Hypertension    Mobitz type 1 second degree atrioventricular block    Morbid obesity (HCC)    Nocturia    OSA treated with BiPAP    Severe w AHI 81.28/hr now on Bipap   Persistent atrial fibrillation [facility])     cardiologist-  dr wilbert Kenora Spayd-- dx 2014   PFO (patent foramen ovale)    Prostate cancer [facility]) urologist-  dr thalia  oncologist-  dr patrcia   dx 06-11-2017--- Stage T1c, Gleason 4+5, PSA 7.65, vol 32.8cc--  scheduled for radiactive seed implants 11-12-2017   RBBB    Wears glasses    Past Surgical History:  Procedure Laterality Date   CARDIAC CATHETERIZATION  1990s  dr t. bihari   per pt told normal coronaries   CARDIOVASCULAR STRESS TEST  06/16/2012   Low risk nuclear study/  normal LV function and wall motion , ef 61%   CARDIOVASCULAR STRESS TEST  06/16/2012   Low risk nuclear study w/ no ischemia/ normal LV function and wall motion , ef 61%   COLONOSCOPY  2012 approx.   CYSTOSCOPY  11/12/2017   Procedure: CYSTOSCOPY FLEXIBLE;  Surgeon: Sherrilee Belvie CROME, MD;  Location: [facility];  Service: Urology;;   KNEE ARTHROSCOPY Right 1990s   LUMBAR LAMINECTOMY/DECOMPRESSION MICRODISCECTOMY N/A 09/27/2014   Procedure: LUMBAR LAMINECTOMY/DECOMPRESSION MICRODISCECTOMY 3 LEVELS L4-5 L3-4 POSSIBLE L2-3 BILATERAL FORAMINOTOMIES AT L-3,4 AND 5;  Surgeon: Reyes Billing, MD;  Location: [facility];  Service: Orthopedics;  Laterality: N/A;   PROSTATE BIOPSY  06-11-2017   dr sherrilee  office   RADIOACTIVE SEED IMPLANT N/A 11/12/2017   Procedure: RADIOACTIVE SEED IMPLANT/BRACHYTHERAPY IMPLANT;  Surgeon: Sherrilee Belvie CROME, MD;  Location: [facility];  Service: Urology;  Laterality: N/A;   ROTATOR CUFF REPAIR Right 2015   SPACE OAR INSTILLATION N/A 11/12/2017   Procedure: SPACE OAR INSTILLATION;  Surgeon: Sherrilee Belvie CROME, MD;  Location: [facility];  Service: Urology;  Laterality: N/A;   TRANSTHORACIC ECHOCARDIOGRAM  05/26/2017   mild LVH, ef 60-65%, grade 2 diastolic dysfunction/  mild dilaged ascending aorta/  trivial PR and TR/  mild RAE   TRANSTHORACIC ECHOCARDIOGRAM     mild LVH, ef 60-65%, grade 2 diastoli dysfunction/ mild dilated ascending aorta/  trivil PR and TR/  mild RAE     Current Meds  Medication  Sig   amLODipine  (NORVASC ) 2.5 MG tablet Take 2.5 mg by mouth daily.   losartan  (COZAAR ) 100 MG tablet Take 1 tablet by mouth every day in the morning.  Please keep scheduled appointment for future refills. Thank you.   metFORMIN (GLUCOPHAGE-XR) 500 MG 24 hr tablet Take 1,000 mg by mouth 2 (two) times daily. (Patient taking differently: Take 1,000 mg by mouth 2 (two) times daily. Take 2 tablets (2000 mg total)  by mouth in the morning and 2 tablets in the evening)   rosuvastatin (CRESTOR) 10 MG tablet Take 10 mg by mouth daily.   warfarin (COUMADIN ) 5 MG tablet Take 1.5 to 2 tablets daily by mouth as directed by Coumadin  clinic     Allergies:   Hydrochlorothiazide    Social History   Tobacco Use   Smoking status: Former    Current packs/day: 0.00    Average packs/day: 0.3 packs/day for 15.0 years (3.8 ttl pk-yrs)    Types: Cigarettes    Start date: 02/02/1985    Quit date: 02/03/2000    Years since quitting: 24.4   Smokeless tobacco: Never  Vaping Use   Vaping status: Never Used  Substance Use Topics   Alcohol use: Not Currently    Comment: hx of ETOH use quit at age 23 (41)   Drug use: No     Family Hx: The patient's family history includes CAD in his mother; Heart attack in his mother; Heart disease in his mother; Hypertension in his brother. There is no history of Cancer.  ROS:   Please see the history of present illness.     All other systems reviewed and are negative.   Labs/Other Tests and Data Reviewed:    Recent Labs: No results found for requested labs within last 365 days.   Recent Lipid Panel Lab Results  Component Value Date/Time   CHOL 79 (L) 04/14/2023 07:28 AM   TRIG 54 04/14/2023 07:28 AM   HDL 41 04/14/2023 07:28 AM   CHOLHDL 1.9 04/14/2023 07:28 AM   LDLCALC 25 04/14/2023 07:28 AM    Wt Readings from Last 3 Encounters:  07/08/24 (!) 330 lb (149.7 kg)  03/11/23 (!) 330 lb (149.7 kg)  03/05/23 (!) 327 lb 4.8 oz (148.5 kg)    EKG  Interpretation Date/Time:  Friday July 08 2024 08:11:06 EST Ventricular Rate:  54 PR Interval:  194 QRS Duration:  146 QT Interval:  476 QTC Calculation: 451 R Axis:   88  Text Interpretation: Sinus bradycardia with Premature ventricular complexes Right bundle branch block When compared with ECG of 08-Jul-2024 08:10, No significant change since last tracing Confirmed by Shlomo Corning ([id number]) on 07/08/2024 8:17:15 AM     Objective:  Vital Signs:  BP (!) 162/70   Pulse (!) 53   Ht 6' 1 (1.854 m)   Wt (!) 330 lb (149.7 kg)   SpO2 98%   BMI 43.54 kg/m   GEN: Well nourished, well developed in no acute distress HEENT: Normal NECK: No JVD; No carotid bruits LYMPHATICS: No lymphadenopathy CARDIAC:RRR, no murmurs, rubs, gallops RESPIRATORY:  Clear to auscultation without rales, wheezing or rhonchi  ABDOMEN: Soft, non-tender, non-distended MUSCULOSKELETAL:  No edema; No deformity  SKIN: Warm and dry NEUROLOGIC:  Alert and oriented x 3 PSYCHIATRIC:  Normal affect    ASSESSMENT & PLAN:    OSA - The patient is tolerating PAP therapy well without any problems. The PAP download performed by his DME was personally reviewed and interpreted by me today and showed an AHI of 6.3/hr on 21/15 cm H2O with 100% compliance in using more than 4 hours nightly.  The patient has been using and benefiting from PAP use and will continue to benefit from therapy.    PAF -Patient is maintaining sinus rhythm on exam today palpitations -No bleeding issues on warfarin -continue warfarin followed in Coumadin  clinic with as needed refills -I have personally reviewed and interpreted outside labs performed by patient's PCP which showed hemoglobin 13.5 on 04/25/2024  HTN -BP is borderline controlled on exam today -Continue amlodipine  2.5 mg daily, losartan  100 mg daily with as needed refills - I will get a copy of last Ouimet from PCP -I have asked him to check his BP twice daily and call with results in a  week   Mobitz I second degree AV block Right bundle branch block -EKG today with normal sinus rhythm with PVCs and right bundle branch block  -asymptomatic with no dizziness or syncope  Coronary artery calcifications -Coronary calcium score 04/02/2023 was elevated at 365 which is 79th percentile for age, sex and race matched controls. -Denies any CP but gets SOB and diaphoretic if he mows the lawn -will get a Stress PET CT to rule out ischemia -Informed Consent   Shared Decision Making/Informed Consent The risks [chest pain, shortness of breath, cardiac arrhythmias, dizziness, blood pressure fluctuations, myocardial infarction, stroke/transient ischemic attack, nausea, vomiting, allergic reaction, radiation exposure, metallic taste sensation and life-threatening complications (estimated to be 1 in 10,000)], benefits (risk stratification, diagnosing coronary artery disease, treatment guidance) and alternatives of a cardiac PET stress test were discussed in detail with Mr. Andringa and he agrees to proceed.  -Continue Crestor 10 mg daily -No ASA due to anticoagulation  Dilated ascending aorta Aortic atherosclerosis -4.3 cm on chest CT 02/07/2023 -Repeat chest CTA for further evaluation  Medication Adjustments/Labs and Tests Ordered: Current medicines are reviewed at length with the patient today.  Concerns regarding medicines are outlined above.  Tests Ordered: Orders Placed This Encounter  Procedures   EKG 12-Lead   EKG 12-Lead    Medication Changes: No orders of the defined types were placed in this encounter.    Disposition:  Follow up in 1 year(s)  Signed, Wilbert Bihari, MD  07/08/2024 8:40 AM    [facility]                                                                                                                                                                                                                                        "

## 2024-07-08 NOTE — Patient Instructions (Signed)
 Medication Instructions:  Your physician recommends that you continue on your current medications as directed. Please refer to the Current Medication list given to you today.  *If you need a refill on your cardiac medications before your next appointment, please call your pharmacy*  Lab Work: Please complete a BMET in our first floor lab before you leave today.  If you have labs (blood work) drawn today and your tests are completely normal, you will receive your results only by: MyChart Message (if you have MyChart) OR A paper copy in the mail If you have any lab test that is abnormal or we need to change your treatment, we will call you to review the results.  Testing/Procedures:    Please report to Radiology at the [facility] Main Entrance 30 minutes early for your test.  [address] [place], [place] [address]                        How to Prepare for Your Cardiac PET/CT Stress Test:  Nothing to eat or drink, except water , 3 hours prior to arrival time.  NO caffeine/decaffeinated products, or chocolate 12 hours prior to arrival. (Please note decaffeinated beverages (teas/coffees) still contain caffeine).  If you have caffeine within 12 hours prior, the test will need to be rescheduled.  Medication instructions: Do not take erectile dysfunction medications for 72 hours prior to test (sildenafil, tadalafil) Do not take nitrates (isosorbide mononitrate, Ranexa) the day before or day of test Do not take tamsulosin the day before or morning of test Hold theophylline containing medications for 12 hours. Hold Dipyridamole 48 hours prior to the test.   You may take your remaining medications with water .  NO perfume, cologne or lotion on chest or abdomen area. FEMALES - Please avoid wearing dresses to this appointment.  Total time is 1 to 2 hours; you may want to bring reading material for the waiting time.  IF YOU THINK YOU MAY BE PREGNANT, OR ARE NURSING PLEASE  INFORM THE TECHNOLOGIST.  In preparation for your appointment, medication and supplies will be purchased.  Appointment availability is limited, so if you need to cancel or reschedule, please call the Radiology Department Scheduler at [phone] 24 hours in advance to avoid a cancellation fee of $100.00  What to Expect When you Arrive:  Once you arrive and check in for your appointment, you will be taken to a preparation room within the Radiology Department.  A technologist or Nurse will obtain your medical history, verify that you are correctly prepped for the exam, and explain the procedure.  Afterwards, an IV will be started in your arm and electrodes will be placed on your skin for EKG monitoring during the stress portion of the exam. Then you will be escorted to the PET/CT scanner.  There, staff will get you positioned on the scanner and obtain a blood pressure and EKG.  During the exam, you will continue to be connected to the EKG and blood pressure machines.  A small, safe amount of a radioactive tracer will be injected in your IV to obtain a series of pictures of your heart along with an injection of a stress agent.    After your Exam:  It is recommended that you eat a meal and drink a caffeinated beverage to counter act any effects of the stress agent.  Drink plenty of fluids for the remainder of the day and urinate frequently for the first couple of  hours after the exam.  Your doctor will inform you of your test results within 7-10 business days.  For more information and frequently asked questions, please visit our website: [email or web address]  For questions about your test or how to prepare for your test, please call: Cardiac Imaging Nurse Navigators Office: [phone]  \\  Your cardiac CT will be scheduled at one of the below locations:    [facility] [address]  [place], [place] [address]  If scheduled at the Heart and  Vascular Tower at [address], please enter the parking lot using the [address] entrance and use the FREE valet service at the patient drop-off area. Enter the building and check-in with registration on the main floor.   Please follow these instructions carefully (unless otherwise directed):   On the Night Before the Test: Be sure to Drink plenty of water . Do not consume any caffeinated/decaffeinated beverages or chocolate 12 hours prior to your test. Do not take any antihistamines 12 hours prior to your test.  On the Day of the Test: Drink plenty of water  until 1 hour prior to the test. Do not eat any food 1 hour prior to test. You may take your regular medications prior to the test.  If you take Furosemide /Hydrochlorothiazide /Spironolactone/Chlorthalidone, please HOLD on the morning of the test. Patients who wear a continuous glucose monitor MUST remove the device prior to scanning.      After the Test: Drink plenty of water . After receiving IV contrast, you may experience a mild flushed feeling. This is normal. On occasion, you may experience a mild rash up to 24 hours after the test. This is not dangerous. If this occurs, you can take Benadryl 25 mg, Zyrtec, Claritin, or Allegra and increase your fluid intake. (Patients taking Tikosyn should avoid Benadryl, and may take Zyrtec, Claritin, or Allegra) If you experience trouble breathing, this can be serious. If it is severe call 911 IMMEDIATELY. If it is mild, please call our office.  We will call to schedule your test 2-4 weeks out understanding that some insurance companies will need an authorization prior to the service being performed.   For more information and frequently asked questions, please visit our website : [email or web address]  For non-scheduling related questions, please contact the cardiac imaging nurse navigator should you have any questions/concerns: Cardiac Imaging Nurse Navigators Direct  Office Dial: [phone]   For scheduling needs, including cancellations and rescheduling, please call Brittany, [phone].  Follow-Up: At [facility], you and your health needs are our priority.  As part of our continuing mission to provide you with exceptional heart care, our providers are all part of one team.  This team includes your primary Cardiologist (physician) and Advanced Practice Providers or APPs (Physician Assistants and Nurse Practitioners) who all work together to provide you with the care you need, when you need it.  Your next appointment:   1 year(s)  Provider:   Wilbert Bihari, MD    We recommend signing up for the patient portal called MyChart.  Sign up information is provided on this After Visit Summary.  MyChart is used to connect with patients for Virtual Visits (Telemedicine).  Patients are able to view lab/test results, encounter notes, upcoming appointments, etc.  Non-urgent messages can be sent to your provider as well.   To learn more about what you can do with MyChart, go to [email or web address].   Other Instructions Please check your blood pressure twice a day for one week.  Check your blood pressure once at lunch and once at dinner. Write down your blood pressure readings along with the date and time of each reading, as well as a heart rate if your machine provides that information. Then, drop off your readings at our front desk, send a picture of the log over Mychart or send the readings to us  in the mail. You can also call our main phone number [phone]) as all of our operators are trained to take down your blood pressure readings.

## 2024-07-09 ENCOUNTER — Other Ambulatory Visit: Payer: Self-pay | Admitting: Cardiology

## 2024-07-09 DIAGNOSIS — I4819 Other persistent atrial fibrillation: Secondary | ICD-10-CM

## 2024-07-10 ENCOUNTER — Ambulatory Visit: Payer: Self-pay | Admitting: Cardiology

## 2024-07-10 DIAGNOSIS — Z79899 Other long term (current) drug therapy: Secondary | ICD-10-CM

## 2024-07-10 DIAGNOSIS — E7800 Pure hypercholesterolemia, unspecified: Secondary | ICD-10-CM

## 2024-07-10 DIAGNOSIS — I77810 Thoracic aortic ectasia: Secondary | ICD-10-CM

## 2024-07-11 ENCOUNTER — Telehealth (HOSPITAL_COMMUNITY): Payer: Self-pay | Admitting: *Deleted

## 2024-07-11 ENCOUNTER — Ambulatory Visit (HOSPITAL_COMMUNITY)
Admission: RE | Admit: 2024-07-11 | Discharge: 2024-07-11 | Disposition: A | Discharge status: Home / Self Care | Source: Ambulatory Visit | Attending: Cardiology | Admitting: Cardiology

## 2024-07-11 DIAGNOSIS — R0600 Dyspnea, unspecified: Secondary | ICD-10-CM | POA: Insufficient documentation

## 2024-07-11 DIAGNOSIS — I77810 Thoracic aortic ectasia: Secondary | ICD-10-CM | POA: Insufficient documentation

## 2024-07-11 MED ORDER — IOHEXOL 350 MG/ML SOLN
75.0000 mL | Freq: Once | INTRAVENOUS | Status: AC | PRN
  Administered 2024-07-11: 75 mL via INTRAVENOUS

## 2024-07-11 NOTE — Telephone Encounter (Signed)
 Attempted to call patient regarding upcoming cardiac PET appointment. Left message on voicemail with name and callback number Johney Frame RN Navigator Cardiac Imaging [facility] [phone] Office  Advised to avoid caffeine for 12 hours prior to test.

## 2024-07-11 NOTE — Telephone Encounter (Signed)
-----   Message from Wilbert Bihari, MD sent at 07/10/2024  7:26 PM EST ----- Please let patient know that labs were normal.  Continue current medical therapy.

## 2024-07-11 NOTE — Telephone Encounter (Signed)
 Call to patient to advise that labs were normal.  Patient verbalizes understanding and agrees to continue current medical therapy.

## 2024-07-12 ENCOUNTER — Ambulatory Visit (HOSPITAL_COMMUNITY)
Admission: RE | Admit: 2024-07-12 | Discharge: 2024-07-12 | Disposition: A | Discharge status: Home / Self Care | Source: Ambulatory Visit | Attending: Cardiology | Admitting: Cardiology

## 2024-07-12 DIAGNOSIS — R0600 Dyspnea, unspecified: Secondary | ICD-10-CM | POA: Diagnosis not present

## 2024-07-12 LAB — NM PET CT CARDIAC PERFUSION MULTI W/ABSOLUTE BLOODFLOW
MBFR: 2.88
Nuc Rest EF: 58 %
Nuc Stress EF: 62 %
Rest MBF: 0.59 ml/g/min
Rest Nuclear Isotope Dose: 28 mCi
ST Depression (mm): 0 mm
Stress MBF: 1.7 ml/g/min
Stress Nuclear Isotope Dose: 28 mCi
TID: 1.23

## 2024-07-12 MED ORDER — RUBIDIUM RB82 GENERATOR (RUBYFILL)
28.0700 | PACK | Freq: Once | INTRAVENOUS | Status: AC
  Administered 2024-07-12: 28.07 via INTRAVENOUS

## 2024-07-12 MED ORDER — REGADENOSON 0.4 MG/5ML IV SOLN
0.4000 mg | Freq: Once | INTRAVENOUS | Status: AC
  Administered 2024-07-12: 0.4 mg via INTRAVENOUS

## 2024-07-12 MED ORDER — REGADENOSON 0.4 MG/5ML IV SOLN
INTRAVENOUS | Status: AC
  Filled 2024-07-12: qty 5

## 2024-07-12 MED ORDER — RUBIDIUM RB82 GENERATOR (RUBYFILL)
28.0200 | PACK | Freq: Once | INTRAVENOUS | Status: AC
  Administered 2024-07-12: 28.02 via INTRAVENOUS

## 2024-07-13 ENCOUNTER — Encounter (HOSPITAL_COMMUNITY)

## 2024-07-18 ENCOUNTER — Telehealth: Payer: Self-pay | Admitting: *Deleted

## 2024-07-18 NOTE — Telephone Encounter (Signed)
Order placed to adapt via community message. 

## 2024-07-18 NOTE — Telephone Encounter (Signed)
-----   Message from Wilbert Bihari, MD sent at 07/15/2024 10:18 AM EST ----- DME office visit for mask fitting ----- Message ----- From: Joshua Dalton MATSU, CMA Sent: 07/15/2024   9:29 AM EST To: Wilbert JONELLE Bihari, MD  What did you want for this patient? I didn't see anything in his chart.    Brad

## 2024-07-19 ENCOUNTER — Other Ambulatory Visit: Payer: Self-pay

## 2024-07-19 DIAGNOSIS — E7800 Pure hypercholesterolemia, unspecified: Secondary | ICD-10-CM

## 2024-07-19 DIAGNOSIS — Z79899 Other long term (current) drug therapy: Secondary | ICD-10-CM

## 2024-07-19 NOTE — Telephone Encounter (Signed)
-----   Message from Wilbert Bihari, MD sent at 07/12/2024  3:22 PM EST ----- He needs to let me know if he gets worsening SOB, CP or started to notice that he gets fatigued with less exertion than he used to

## 2024-07-19 NOTE — Telephone Encounter (Signed)
 Call to patient to discuss stress test and CT chest results. Patient verbalizes understanding of normal non-cardiac read of PET and CT scans.   Discussed Coronary CTA showed possible old small infarct in the LAD vessel with some isolated calcification in the LAD c/w CAD. Overall myocardial blood flow reserve is normal. Overall low risk study due to small amount of myocardium affected. No ASA due to coumadin . Patient agrees to come in for FLP and ALT. No BB due to resting bradycardia. Patient denies worsening SOB, CP or started to notice that he gets fatigued with less exertion than he used to .   Also discussedChest CT showed no evidence of thoracic aortic dissection. There was mild enlargement of the ascending aorta at 41 mm which is unchanged from prior study. Aortic atherosclerosis present. Similar-appearing stable multifocal 5 mm solid pulmonary nodules, patient verbalizes understanding I will  forward to his PCP for further evaluation. Patient agrees to get 2D echo in 1 year for ascending aorta dilatation.

## 2024-07-19 NOTE — Telephone Encounter (Signed)
 Patient providing the following blood pressure readings, advises he takes losartan  and Amlodipine  at 8 AM every day.   07/09/24  10 AM BP 169/102 HR 54 6 pm   BP 169-90 HR 71  07/10/24  10:15 AM BP 160/98 HR 56  6PM       BP167-88 HR 57  07/11/24  10:00 168/82 HR 58 6 PM 159/79 HR 62  07/13/24  7:45 AM 165/85 HR 55 6 PM 153/76 HR 67  07/15/24  10 AM 158/85 HR 63 6 PM 166/82 HR 64

## 2024-07-19 NOTE — Addendum Note (Signed)
 Addended by: JANIT GENI CROME on: 07/19/2024 04:29 PM   Modules accepted: Orders

## 2024-07-28 LAB — LIPID PANEL
Chol/HDL Ratio: 2 ratio (ref 0.0–5.0)
Cholesterol, Total: 87 mg/dL — ABNORMAL LOW (ref 100–199)
HDL: 43 mg/dL
LDL Chol Calc (NIH): 27 mg/dL (ref 0–99)
Triglycerides: 82 mg/dL (ref 0–149)
VLDL Cholesterol Cal: 17 mg/dL (ref 5–40)

## 2024-07-29 LAB — ALT: ALT: 8 [IU]/L (ref 0–44)

## 2024-07-31 ENCOUNTER — Ambulatory Visit: Payer: Self-pay | Admitting: Cardiology

## 2024-08-05 ENCOUNTER — Telehealth: Payer: Self-pay | Admitting: Cardiology

## 2024-08-05 NOTE — Telephone Encounter (Signed)
 What problem are you experiencing? NA  Who is your medical equipment company? NA  3)    If patient is calling about their sleep study results please route to [facility] Sleep Study Pool.  Please route to the sleep study coordinator.    Pt would like a c/b regarding getting his supplies for his Z pack. Please advise

## 2024-08-09 ENCOUNTER — Ambulatory Visit
